# Patient Record
Sex: Male | Born: 1935
Health system: Southern US, Community
[De-identification: ages and names within clinical notes are randomized; demographics above are authoritative.]

## PROBLEM LIST (undated history)

## (undated) DIAGNOSIS — J449 Chronic obstructive pulmonary disease, unspecified: Secondary | ICD-10-CM

## (undated) DIAGNOSIS — K635 Polyp of colon: Secondary | ICD-10-CM

## (undated) DIAGNOSIS — I272 Pulmonary hypertension, unspecified: Secondary | ICD-10-CM

## (undated) DIAGNOSIS — I4891 Unspecified atrial fibrillation: Secondary | ICD-10-CM

## (undated) DIAGNOSIS — R7303 Prediabetes: Secondary | ICD-10-CM

## (undated) DIAGNOSIS — I10 Essential (primary) hypertension: Secondary | ICD-10-CM

## (undated) DIAGNOSIS — E785 Hyperlipidemia, unspecified: Secondary | ICD-10-CM

## (undated) DIAGNOSIS — N400 Enlarged prostate without lower urinary tract symptoms: Secondary | ICD-10-CM

## (undated) DIAGNOSIS — K5790 Diverticulosis of intestine, part unspecified, without perforation or abscess without bleeding: Secondary | ICD-10-CM

## (undated) HISTORY — DX: Prediabetes: R73.03

## (undated) HISTORY — DX: Polyp of colon: K63.5

## (undated) HISTORY — DX: Hyperlipidemia, unspecified: E78.5

## (undated) HISTORY — DX: Diverticulosis of intestine, part unspecified, without perforation or abscess without bleeding: K57.90

## (undated) HISTORY — DX: Essential (primary) hypertension: I10

## (undated) HISTORY — DX: Benign prostatic hyperplasia without lower urinary tract symptoms: N40.0

## (undated) HISTORY — PX: PROSTATE BIOPSY: SHX241

## (undated) HISTORY — DX: Pulmonary hypertension, unspecified: I27.20

---
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
## 2001-04-26 ENCOUNTER — Ambulatory Visit (HOSPITAL_COMMUNITY): Admission: RE | Admit: 2001-04-26 | Discharge: 2001-04-26 | Payer: Self-pay | Admitting: Family Medicine

## 2001-04-26 ENCOUNTER — Encounter: Payer: Self-pay | Admitting: Family Medicine

## 2001-05-11 ENCOUNTER — Ambulatory Visit (HOSPITAL_COMMUNITY): Admission: RE | Admit: 2001-05-11 | Discharge: 2001-05-11 | Payer: Self-pay | Admitting: General Surgery

## 2001-07-25 ENCOUNTER — Other Ambulatory Visit: Admission: RE | Admit: 2001-07-25 | Discharge: 2001-07-25 | Payer: Self-pay | Admitting: Urology

## 2002-04-10 ENCOUNTER — Ambulatory Visit (HOSPITAL_COMMUNITY): Admission: RE | Admit: 2002-04-10 | Discharge: 2002-04-10 | Payer: Self-pay | Admitting: Family Medicine

## 2002-04-10 ENCOUNTER — Encounter: Payer: Self-pay | Admitting: Family Medicine

## 2002-05-28 ENCOUNTER — Ambulatory Visit (HOSPITAL_COMMUNITY): Admission: RE | Admit: 2002-05-28 | Discharge: 2002-05-28 | Payer: Self-pay | Admitting: Family Medicine

## 2002-05-30 ENCOUNTER — Ambulatory Visit (HOSPITAL_COMMUNITY): Admission: RE | Admit: 2002-05-30 | Discharge: 2002-05-30 | Payer: Self-pay | Admitting: Family Medicine

## 2004-07-07 ENCOUNTER — Ambulatory Visit (HOSPITAL_COMMUNITY): Admission: RE | Admit: 2004-07-07 | Discharge: 2004-07-07 | Payer: Self-pay | Admitting: Family Medicine

## 2004-10-06 ENCOUNTER — Ambulatory Visit: Payer: Self-pay | Admitting: Family Medicine

## 2005-02-03 ENCOUNTER — Ambulatory Visit: Payer: Self-pay | Admitting: Family Medicine

## 2005-07-06 ENCOUNTER — Ambulatory Visit (HOSPITAL_COMMUNITY): Admission: RE | Admit: 2005-07-06 | Discharge: 2005-07-06 | Payer: Self-pay | Admitting: Family Medicine

## 2005-07-06 ENCOUNTER — Ambulatory Visit: Payer: Self-pay | Admitting: Family Medicine

## 2005-07-20 ENCOUNTER — Ambulatory Visit: Payer: Self-pay | Admitting: Family Medicine

## 2005-09-13 ENCOUNTER — Ambulatory Visit: Payer: Self-pay | Admitting: Family Medicine

## 2005-11-05 ENCOUNTER — Ambulatory Visit: Payer: Self-pay | Admitting: Family Medicine

## 2006-03-04 ENCOUNTER — Ambulatory Visit: Payer: Self-pay | Admitting: Family Medicine

## 2006-08-26 ENCOUNTER — Ambulatory Visit: Payer: Self-pay | Admitting: Family Medicine

## 2007-02-13 ENCOUNTER — Encounter: Payer: Self-pay | Admitting: Family Medicine

## 2007-02-13 LAB — CONVERTED CEMR LAB
ALT: 19 units/L (ref 0–53)
AST: 16 units/L (ref 0–37)
Albumin: 3.6 g/dL (ref 3.5–5.2)
Alkaline Phosphatase: 91 units/L (ref 39–117)
Bilirubin, Direct: 0.1 mg/dL (ref 0.0–0.3)
Indirect Bilirubin: 0.4 mg/dL (ref 0.0–0.9)
Total Bilirubin: 0.5 mg/dL (ref 0.3–1.2)
Total Protein: 6.8 g/dL (ref 6.0–8.3)

## 2007-02-14 ENCOUNTER — Encounter: Payer: Self-pay | Admitting: Family Medicine

## 2007-02-14 LAB — CONVERTED CEMR LAB
BUN: 23 mg/dL (ref 6–23)
CO2: 27 meq/L (ref 19–32)
Calcium: 9.1 mg/dL (ref 8.4–10.5)
Chloride: 103 meq/L (ref 96–112)
Cholesterol: 190 mg/dL (ref 0–200)
Creatinine, Ser: 1.05 mg/dL (ref 0.40–1.50)
Glucose, Bld: 98 mg/dL (ref 70–99)
HDL: 72 mg/dL (ref >39)
LDL Cholesterol: 108 mg/dL — ABNORMAL HIGH (ref 0–99)
Potassium: 4.6 meq/L (ref 3.5–5.3)
Sodium: 140 meq/L (ref 135–145)
Total CHOL/HDL Ratio: 2.6
Triglycerides: 50 mg/dL (ref <150)
VLDL: 10 mg/dL (ref 0–40)

## 2007-02-22 ENCOUNTER — Ambulatory Visit: Payer: Self-pay | Admitting: Family Medicine

## 2007-03-21 ENCOUNTER — Ambulatory Visit: Payer: Self-pay | Admitting: Internal Medicine

## 2007-03-21 ENCOUNTER — Ambulatory Visit (HOSPITAL_COMMUNITY): Admission: RE | Admit: 2007-03-21 | Discharge: 2007-03-21 | Payer: Self-pay | Admitting: Internal Medicine

## 2007-03-21 HISTORY — PX: COLONOSCOPY: SHX174

## 2007-03-21 LAB — HM COLONOSCOPY

## 2007-09-06 ENCOUNTER — Ambulatory Visit: Payer: Self-pay | Admitting: Family Medicine

## 2007-09-18 ENCOUNTER — Encounter: Payer: Self-pay | Admitting: Family Medicine

## 2007-09-18 LAB — CONVERTED CEMR LAB: PSA: 0.5 ng/mL

## 2007-09-26 ENCOUNTER — Ambulatory Visit: Payer: Self-pay | Admitting: Family Medicine

## 2008-04-01 ENCOUNTER — Encounter: Payer: Self-pay | Admitting: Family Medicine

## 2008-04-01 LAB — CONVERTED CEMR LAB
ALT: 19 units/L (ref 0–53)
AST: 21 units/L (ref 0–37)
Albumin: 3.7 g/dL (ref 3.5–5.2)
Alkaline Phosphatase: 85 units/L (ref 39–117)
BUN: 26 mg/dL — ABNORMAL HIGH (ref 6–23)
Basophils Absolute: 0 10*3/uL (ref 0.0–0.1)
Basophils Relative: 0 % (ref 0–1)
Bilirubin, Direct: 0.1 mg/dL (ref 0.0–0.3)
CO2: 26 meq/L (ref 19–32)
Calcium: 9.2 mg/dL (ref 8.4–10.5)
Chloride: 102 meq/L (ref 96–112)
Cholesterol: 187 mg/dL (ref 0–200)
Creatinine, Ser: 1.01 mg/dL (ref 0.40–1.50)
Eosinophils Absolute: 0.2 10*3/uL (ref 0.0–0.7)
Eosinophils Relative: 2 % (ref 0–5)
Glucose, Bld: 115 mg/dL — ABNORMAL HIGH (ref 70–99)
HCT: 52.1 % — ABNORMAL HIGH (ref 39.0–52.0)
HDL: 77 mg/dL (ref >39)
Hemoglobin: 16.7 g/dL (ref 13.0–17.0)
Indirect Bilirubin: 0.3 mg/dL (ref 0.0–0.9)
LDL Cholesterol: 102 mg/dL — ABNORMAL HIGH (ref 0–99)
Lymphocytes Relative: 16 % (ref 12–46)
Lymphs Abs: 1.3 10*3/uL (ref 0.7–4.0)
MCHC: 32.1 g/dL (ref 30.0–36.0)
MCV: 93.2 fL (ref 78.0–100.0)
Monocytes Absolute: 0.8 10*3/uL (ref 0.1–1.0)
Monocytes Relative: 10 % (ref 3–12)
Neutro Abs: 5.6 10*3/uL (ref 1.7–7.7)
Neutrophils Relative %: 72 % (ref 43–77)
Platelets: 310 10*3/uL (ref 150–400)
Potassium: 5.2 meq/L (ref 3.5–5.3)
RBC: 5.59 M/uL (ref 4.22–5.81)
RDW: 14.3 % (ref 11.5–15.5)
Sodium: 140 meq/L (ref 135–145)
Total Bilirubin: 0.4 mg/dL (ref 0.3–1.2)
Total CHOL/HDL Ratio: 2.4
Total Protein: 7 g/dL (ref 6.0–8.3)
Triglycerides: 39 mg/dL (ref <150)
VLDL: 8 mg/dL (ref 0–40)
WBC: 7.8 10*3/uL (ref 4.0–10.5)

## 2008-04-08 ENCOUNTER — Encounter: Payer: Self-pay | Admitting: Family Medicine

## 2008-04-08 DIAGNOSIS — D126 Benign neoplasm of colon, unspecified: Secondary | ICD-10-CM

## 2008-04-08 DIAGNOSIS — J45909 Unspecified asthma, uncomplicated: Secondary | ICD-10-CM | POA: Insufficient documentation

## 2008-04-08 DIAGNOSIS — I10 Essential (primary) hypertension: Secondary | ICD-10-CM

## 2008-04-08 DIAGNOSIS — E785 Hyperlipidemia, unspecified: Secondary | ICD-10-CM

## 2008-04-09 ENCOUNTER — Ambulatory Visit: Payer: Self-pay | Admitting: Family Medicine

## 2008-04-09 ENCOUNTER — Encounter: Payer: Self-pay | Admitting: Family Medicine

## 2008-06-24 ENCOUNTER — Encounter: Payer: Self-pay | Admitting: Family Medicine

## 2008-07-09 ENCOUNTER — Ambulatory Visit: Payer: Self-pay | Admitting: Family Medicine

## 2008-09-04 ENCOUNTER — Encounter: Payer: Self-pay | Admitting: Family Medicine

## 2008-09-17 ENCOUNTER — Ambulatory Visit: Payer: Self-pay | Admitting: Family Medicine

## 2008-10-04 ENCOUNTER — Encounter: Payer: Self-pay | Admitting: Family Medicine

## 2008-12-17 ENCOUNTER — Encounter: Payer: Self-pay | Admitting: Family Medicine

## 2008-12-18 LAB — CONVERTED CEMR LAB
BUN: 21 mg/dL (ref 6–23)
Bilirubin, Direct: 0.1 mg/dL (ref 0.0–0.3)
CO2: 29 meq/L (ref 19–32)
Cholesterol: 177 mg/dL (ref 0–200)
Glucose, Bld: 115 mg/dL — ABNORMAL HIGH (ref 70–99)
Indirect Bilirubin: 0.4 mg/dL (ref 0.0–0.9)
LDL Cholesterol: 86 mg/dL (ref 0–99)
Potassium: 5.2 meq/L (ref 3.5–5.3)
Total Bilirubin: 0.5 mg/dL (ref 0.3–1.2)
Total CHOL/HDL Ratio: 2.2
VLDL: 11 mg/dL (ref 0–40)

## 2008-12-20 ENCOUNTER — Encounter: Payer: Self-pay | Admitting: Family Medicine

## 2008-12-26 ENCOUNTER — Ambulatory Visit: Payer: Self-pay | Admitting: Family Medicine

## 2008-12-26 LAB — CONVERTED CEMR LAB: Hgb A1c MFr Bld: 6.2 %

## 2008-12-28 DIAGNOSIS — E739 Lactose intolerance, unspecified: Secondary | ICD-10-CM | POA: Insufficient documentation

## 2009-02-13 ENCOUNTER — Encounter: Payer: Self-pay | Admitting: Family Medicine

## 2009-03-13 ENCOUNTER — Ambulatory Visit: Payer: Self-pay | Admitting: Family Medicine

## 2009-06-23 ENCOUNTER — Encounter: Payer: Self-pay | Admitting: Family Medicine

## 2009-06-23 DIAGNOSIS — R5383 Other fatigue: Secondary | ICD-10-CM

## 2009-06-23 DIAGNOSIS — R5381 Other malaise: Secondary | ICD-10-CM

## 2009-06-25 LAB — CONVERTED CEMR LAB
Albumin: 3.6 g/dL (ref 3.5–5.2)
Bilirubin, Direct: 0.1 mg/dL (ref 0.0–0.3)
CO2: 26 meq/L (ref 19–32)
Chloride: 99 meq/L (ref 96–112)
Glucose, Bld: 121 mg/dL — ABNORMAL HIGH (ref 70–99)
HDL: 78 mg/dL (ref >39)
Potassium: 4.5 meq/L (ref 3.5–5.3)
Sodium: 137 meq/L (ref 135–145)
TSH: 1.781 microintl units/mL (ref 0.350–4.500)
Total Bilirubin: 0.6 mg/dL (ref 0.3–1.2)
Total CHOL/HDL Ratio: 2.3
VLDL: 9 mg/dL (ref 0–40)

## 2009-06-30 ENCOUNTER — Ambulatory Visit: Payer: Self-pay | Admitting: Family Medicine

## 2009-06-30 DIAGNOSIS — J40 Bronchitis, not specified as acute or chronic: Secondary | ICD-10-CM

## 2009-06-30 LAB — CONVERTED CEMR LAB: Hgb A1c MFr Bld: 5.9 %

## 2009-07-03 ENCOUNTER — Encounter: Payer: Self-pay | Admitting: Family Medicine

## 2009-07-31 ENCOUNTER — Encounter: Payer: Self-pay | Admitting: Family Medicine

## 2009-08-01 ENCOUNTER — Encounter: Payer: Self-pay | Admitting: Family Medicine

## 2009-09-23 ENCOUNTER — Encounter: Payer: Self-pay | Admitting: Family Medicine

## 2009-09-30 ENCOUNTER — Ambulatory Visit: Payer: Self-pay | Admitting: Family Medicine

## 2009-09-30 DIAGNOSIS — J45909 Unspecified asthma, uncomplicated: Secondary | ICD-10-CM | POA: Insufficient documentation

## 2009-10-01 ENCOUNTER — Encounter: Payer: Self-pay | Admitting: Family Medicine

## 2009-10-31 ENCOUNTER — Encounter: Payer: Self-pay | Admitting: Family Medicine

## 2009-11-22 DIAGNOSIS — D638 Anemia in other chronic diseases classified elsewhere: Secondary | ICD-10-CM | POA: Diagnosis present

## 2009-11-22 DIAGNOSIS — R7989 Other specified abnormal findings of blood chemistry: Secondary | ICD-10-CM | POA: Diagnosis present

## 2009-11-22 DIAGNOSIS — R7303 Prediabetes: Secondary | ICD-10-CM

## 2009-11-22 HISTORY — DX: Prediabetes: R73.03

## 2010-02-03 ENCOUNTER — Encounter: Payer: Self-pay | Admitting: Family Medicine

## 2010-02-25 ENCOUNTER — Encounter: Payer: Self-pay | Admitting: Family Medicine

## 2010-02-25 LAB — CONVERTED CEMR LAB
ALT: 13 units/L (ref 0–53)
AST: 18 units/L (ref 0–37)
Albumin: 3.8 g/dL (ref 3.5–5.2)
Alkaline Phosphatase: 78 units/L (ref 39–117)
Basophils Relative: 0 % (ref 0–1)
CO2: 29 meq/L (ref 19–32)
Calcium: 9.5 mg/dL (ref 8.4–10.5)
Creatinine, Ser: 0.94 mg/dL (ref 0.40–1.50)
Eosinophils Absolute: 0.3 10*3/uL (ref 0.0–0.7)
HDL: 66 mg/dL (ref >39)
Hgb A1c MFr Bld: 6 % (ref 4.6–6.1)
Lymphs Abs: 1.6 10*3/uL (ref 0.7–4.0)
MCHC: 31.2 g/dL (ref 30.0–36.0)
MCV: 95.1 fL (ref 78.0–100.0)
Monocytes Relative: 8 % (ref 3–12)
Neutro Abs: 6.3 10*3/uL (ref 1.7–7.7)
Neutrophils Relative %: 70 % (ref 43–77)
Platelets: 326 10*3/uL (ref 150–400)
RBC: 5.56 M/uL (ref 4.22–5.81)
Sodium: 141 meq/L (ref 135–145)
Total CHOL/HDL Ratio: 2.5
Total Protein: 6.8 g/dL (ref 6.0–8.3)
Triglycerides: 45 mg/dL (ref <150)
WBC: 9 10*3/uL (ref 4.0–10.5)

## 2010-03-02 ENCOUNTER — Ambulatory Visit: Payer: Self-pay | Admitting: Family Medicine

## 2010-08-04 ENCOUNTER — Encounter: Payer: Self-pay | Admitting: Family Medicine

## 2010-08-07 ENCOUNTER — Ambulatory Visit: Payer: Self-pay | Admitting: Family Medicine

## 2010-08-17 LAB — CONVERTED CEMR LAB
AST: 18 units/L (ref 0–37)
Alkaline Phosphatase: 75 units/L (ref 39–117)
BUN: 20 mg/dL (ref 6–23)
Calcium: 8.9 mg/dL (ref 8.4–10.5)
Chloride: 99 meq/L (ref 96–112)
Cholesterol: 154 mg/dL (ref 0–200)
Creatinine, Ser: 0.9 mg/dL (ref 0.40–1.50)
HDL: 65 mg/dL (ref >39)
Indirect Bilirubin: 0.3 mg/dL (ref 0.0–0.9)
LDL Cholesterol: 81 mg/dL (ref 0–99)
Total Protein: 6.8 g/dL (ref 6.0–8.3)
Triglycerides: 39 mg/dL (ref <150)

## 2010-08-19 ENCOUNTER — Encounter: Payer: Self-pay | Admitting: Family Medicine

## 2010-08-24 ENCOUNTER — Ambulatory Visit: Payer: Self-pay | Admitting: Family Medicine

## 2010-09-21 ENCOUNTER — Telehealth: Payer: Self-pay | Admitting: Family Medicine

## 2010-10-12 ENCOUNTER — Encounter: Payer: Self-pay | Admitting: Family Medicine

## 2010-10-26 ENCOUNTER — Encounter: Payer: Self-pay | Admitting: Family Medicine

## 2010-11-10 ENCOUNTER — Telehealth (INDEPENDENT_AMBULATORY_CARE_PROVIDER_SITE_OTHER): Payer: Self-pay | Admitting: *Deleted

## 2010-11-22 DIAGNOSIS — I272 Pulmonary hypertension, unspecified: Secondary | ICD-10-CM

## 2010-11-22 HISTORY — DX: Pulmonary hypertension, unspecified: I27.20

## 2010-11-30 ENCOUNTER — Telehealth: Payer: Self-pay | Admitting: Family Medicine

## 2010-12-22 NOTE — Letter (Signed)
[facility]   Imported By: Lind Guest 10/26/2010 11:37:19  _____________________________________________________________________  External Attachment:    Type:   Image     Comment:   External Document

## 2010-12-22 NOTE — Letter (Signed)
Summary: [facility]   [facility]   Imported By: Lind Guest 10/12/2010 09:06:20  _____________________________________________________________________  External Attachment:    Type:   Image     Comment:   External Document

## 2010-12-22 NOTE — Letter (Signed)
Summary: [facility]   Imported By: Lind Guest 09/07/2010 14:08:40  _____________________________________________________________________  External Attachment:    Type:   Image     Comment:   External Document

## 2010-12-22 NOTE — Assessment & Plan Note (Signed)
Summary: office visit   Vital Signs:  Patient profile:   76 year old male Height:      69.5 inches Weight:      214 pounds BMI:     31.26 O2 Sat:      93 % Pulse rate:   87 / minute Pulse rhythm:   regular Resp:     16 per minute BP sitting:   122 / 82  (left arm) Cuff size:   large  Vitals Entered By: Everitt Amber LPN (March 02, 2010 8:12 AM)  Nutrition Counseling: Patient's BMI is greater than 25 and therefore counseled on weight management options. CC: Follow up chronic problems   CC:  Follow up chronic problems.  History of Present Illness: Reports  that he has been  doing well. Denies recent fever or chills. Denies sinus pressure, nasal congestion , ear pain or sore throat. Denies chest congestion, or cough productive of sputum.He continues to have exertional dyspnea, uses his neb machine twice daily and has otc epinephrine spray. I advised against this and proivided a script for albuterol again Denies chest pain, palpitations, PND, orthopnea or leg swelling. Denies abdominal pain, nausea, vomitting, diarrhea or constipation. Denies change in bowel movements or bloody stool. Denies dysuria , frequency, incontinence or hesitancy. Denies  joint pain, swelling, or reduced mobility. Denies headaches, vertigo, seizures. Denies depression, anxiety or insomnia. Denies  rash, lesions, or itch. the pt is keeping up with his prostate [facility] regularly. mr Lauderback keeps physically active , and watches his carb intake to stave off diabetes, so far , he has been doing very well.     Current Medications (verified): 1)  Flovent Hfa 110 Mcg/act  Aero (Fluticasone Propionate  Hfa) .... Use As Directed 2)  Prednisone 5 Mg Tabs (Prednisone) .... Take 1 Tablet By Mouth Once A Day 3)  Amlodipine Besylate 10 Mg Tabs (Amlodipine Besylate) .... Take 1 Tablet By Mouth Once A Day 4)  Hydrochlorothiazide 25 Mg Tabs (Hydrochlorothiazide) .... Take 1 Tablet By Mouth Once A Day 5)  Proair Hfa 108  (90 Base) Mcg/act Aers (Albuterol Sulfate) .... Two Puffs Three Times A Day Prn 6)  Albuterol Sulfate (2.5 Mg/67ml) 0.083% Nebu (Albuterol Sulfate) .... One Vial in Nebulizer Two To Three Times A Day 7)  Ipratropium Bromide 0.02 % Soln (Ipratropium Bromide) .... One Vial in Nebulizer Two To Three Times A Day 8)  Simvastatin 20 Mg Tabs (Simvastatin) .... One Tab By Mouth Qhs  Allergies (verified): No Known Drug Allergies  Review of Systems      See HPI General:  Complains of fatigue. Eyes:  Denies blurring, discharge, eye pain, and red eye. Endo:  Denies cold intolerance, excessive hunger, excessive thirst, excessive urination, heat intolerance, polyuria, and weight change. Heme:  Complains of abnormal bruising; denies bleeding. Allergy:  Complains of seasonal allergies and sneezing; denies hives or rash and itching eyes.  Physical Exam  General:  Well-developed,overweight,in no acute distress; alert,appropriate and cooperative throughout examination HEENT: No facial asymmetry,  EOMI, No sinus tenderness, TM's Clear, oropharynx  pink and moist. poor dentition  Chest: decreased air entry,clear to ascultation, no crackles , few highpitched wheezes CVS: S1, S2, No murmurs, No S3.   Abd: Soft, Nontender.  MS: Adequate ROM spine, hips, shoulders and knees.  Ext: No edema.   CNS: CN 2-12 intact, power tone and sensation normal throughout.   Skin: Intact, superficial bruises, no rashes or ulceration.  Psych: Good eye contact, normal affect.  Memory intact,  not anxious or depressed appearing.    Impression & Recommendations:  Problem # 1:  IMPAIRED GLUCOSE TOLERANCE (ICD-271.3) Assessment Unchanged  Orders: T- Hemoglobin A1C ([id number]) April value 6.0  Problem # 2:  ASTHMA (ICD-493.90) Assessment: Unchanged  The following medications were removed from the medication list:    Proventil Hfa 108 (90 Base) Mcg/act Aers (Albuterol sulfate) ..... Inhale two puffs every six to eight  hours as needed.    Prednisone (pak) 5 Mg Tabs (Prednisone) ..... Use as directed His updated medication list for this problem includes:    Flovent Hfa 110 Mcg/act Aero (Fluticasone propionate  hfa) ..... Use as directed    Prednisone 5 Mg Tabs (Prednisone) .Marland Kitchen... Take 1 tablet by mouth once a day    Proair Hfa 108 (90 Base) Mcg/act Aers (Albuterol sulfate) .Marland Kitchen..Marland Kitchen Two puffs three times a day prn    Albuterol Sulfate (2.5 Mg/65ml) 0.083% Nebu (Albuterol sulfate) ..... One vial in nebulizer two to three times a day    Ipratropium Bromide 0.02 % Soln (Ipratropium bromide) ..... One vial in nebulizer two to three times a day    Symbicort 80-4.5 Mcg/act Aero (Budesonide-formoterol fumarate) .Marland Kitchen... 2 puffs twice daily  Problem # 3:  HYPERTENSION (ICD-401.9) Assessment: Unchanged  His updated medication list for this problem includes:    Amlodipine Besylate 10 Mg Tabs (Amlodipine besylate) .Marland Kitchen... Take 1 tablet by mouth once a day    Hydrochlorothiazide 25 Mg Tabs (Hydrochlorothiazide) .Marland Kitchen... Take 1 tablet by mouth once a day  Orders: T-Basic Metabolic Panel ([id number])  BP today: 122/82 Prior BP: 127/84 (09/30/2009)  Labs Reviewed: K+: 5.0 (02/24/2010) Creat: : 0.94 (02/24/2010)   Chol: 162 (02/24/2010)   HDL: 66 (02/24/2010)   LDL: 87 (02/24/2010)   TG: 45 (02/24/2010)  Problem # 4:  HYPERLIPIDEMIA (ICD-272.4) Assessment: Improved  His updated medication list for this problem includes:    Simvastatin 20 Mg Tabs (Simvastatin) ..... One tab by mouth qhs  Orders: T-Hepatic Function [id number]) T-Lipid Profile [id number])  Labs Reviewed: SGOT: 18 (02/24/2010)   SGPT: 13 (02/24/2010)   HDL:66 (02/24/2010), 78 (06/24/2009)  LDL:87 (02/24/2010), 96 (06/24/2009)  Chol:162 (02/24/2010), 183 (06/24/2009)  Trig:45 (02/24/2010), 45 (06/24/2009)  Complete Medication List: 1)  Flovent Hfa 110 Mcg/act Aero (Fluticasone propionate  hfa) .... Use as directed 2)  Prednisone 5 Mg Tabs (Prednisone)  .... Take 1 tablet by mouth once a day 3)  Amlodipine Besylate 10 Mg Tabs (Amlodipine besylate) .... Take 1 tablet by mouth once a day 4)  Hydrochlorothiazide 25 Mg Tabs (Hydrochlorothiazide) .... Take 1 tablet by mouth once a day 5)  Proair Hfa 108 (90 Base) Mcg/act Aers (Albuterol sulfate) .... Two puffs three times a day prn 6)  Albuterol Sulfate (2.5 Mg/79ml) 0.083% Nebu (Albuterol sulfate) .... One vial in nebulizer two to three times a day 7)  Ipratropium Bromide 0.02 % Soln (Ipratropium bromide) .... One vial in nebulizer two to three times a day 8)  Simvastatin 20 Mg Tabs (Simvastatin) .... One tab by mouth qhs 9)  Symbicort 80-4.5 Mcg/act Aero (Budesonide-formoterol fumarate) .... 2 puffs twice daily  Other Orders: T-TSH ([id number])  Patient Instructions: 1)  f/u in 5.5 months 2)  It is important that you exercise regularly at least 20 minutes 5 times a week. If you develop chest pain, have severe difficulty breathing, or feel very tired , stop exercising immediately and seek medical attention. 3)  You need to lose weight. Consider a lower calorie diet and regular exercise.  4)  BMP prior to visit, ICD-9: 5)  Hepatic Panel prior to visit, ICD-9: 6)  Lipid Panel prior to visit, ICD-9:  fasting in 5.5 months 7)  HbgA1C prior to visit, ICD-9: 8)  tSH 9)  pls stop the otc inhsaler and fill the rescue script given, pls call if it is tto expensive 10)  i will send for symbicort for you and let you know when available. 11)  Have a safe and happy 6 months Prescriptions: SYMBICORT 80-4.5 MCG/ACT AERO (BUDESONIDE-FORMOTEROL FUMARATE) 2 puffs twice daily  #1 x 3   Entered and Authorized by:   Syliva Overman MD   Signed by:   Syliva Overman MD on 03/02/2010   Method used:   Historical   RxID:   [id number]

## 2010-12-22 NOTE — Progress Notes (Signed)
Summary: dr. Rito Ehrlich  dr. Rito Ehrlich   Imported By: Lind Guest 02/13/2010 13:51:50  _____________________________________________________________________  External Attachment:    Type:   Image     Comment:   External Document

## 2010-12-22 NOTE — Letter (Signed)
Summary: [facility]   Imported By: Lind Guest 08/26/2010 12:50:01  _____________________________________________________________________  External Attachment:    Type:   Image     Comment:   External Document

## 2010-12-22 NOTE — Assessment & Plan Note (Signed)
Summary: flu shot  Nurse Visit   Allergies: No Known Drug Allergies  Immunizations Administered:  Influenza Vaccine # 1:    Vaccine Type: Fluvax Non-MCR    Site: right deltoid    Mfr: novartis    Dose: 0.5 ml    Route: IM    Given by: Adella Hare LPN    Exp. Date: 03/2011    Lot #: [id number]    VIS given: 06/16/10 version given August 07, 2010.  Orders Added: 1)  Influenza Vaccine NON MCR [00028]

## 2010-12-22 NOTE — Letter (Signed)
Summary: DR. Rito Ehrlich  DR. Rito Ehrlich   Imported By: Lind Guest 08/06/2010 14:15:45  _____________________________________________________________________  External Attachment:    Type:   Image     Comment:   External Document

## 2010-12-22 NOTE — Assessment & Plan Note (Signed)
Summary: office visit   Vital Signs:  Patient profile:   76 year old male Height:      69.5 inches Weight:      204 pounds BMI:     29.80 O2 Sat:      95 % Pulse rate:   87 / minute Pulse rhythm:   regular Resp:     16 per minute BP sitting:   120 / 74  (left arm) Cuff size:   regular  Vitals Entered By: Everitt Amber LPN (August 24, 2010 8:06 AM)  Nutrition Counseling: Patient's BMI is greater than 25 and therefore counseled on weight management options. CC: Follow up chronic problems   CC:  Follow up chronic problems.  History of Present Illness: Reports  thathe has been  doing well. Denies recent fever or chills. Denies sinus pressure, nasal congestion , ear pain or sore throat. Denies chest congestion, or cough productive of sputum.He has had no recent asthma flares. states the symbicort leaves a bad taste in his mouth and he cannot tolerate it. Denies chest pain, palpitations, PND, orthopnea or leg swelling. Denies abdominal pain, nausea, vomitting, diarrhea or constipation. Denies change in bowel movements or bloody stool. Denies dysuria , frequency, incontinence or hesitancy. Denies  joint pain, swelling, or reduced mobility. Denies headaches, vertigo, seizures. Denies depression, anxiety or insomnia. Denies  rash, lesions, or itch. he has been cutting back on hiss intake, has lost weight and does try to keep active.     Current Medications (verified): 1)  Prednisone 5 Mg Tabs (Prednisone) .... Take 1 Tablet By Mouth Once A Day 2)  Amlodipine Besylate 10 Mg Tabs (Amlodipine Besylate) .... Take 1 Tablet By Mouth Once A Day 3)  Hydrochlorothiazide 25 Mg Tabs (Hydrochlorothiazide) .... Take 1 Tablet By Mouth Once A Day 4)  Proair Hfa 108 (90 Base) Mcg/act Aers (Albuterol Sulfate) .... Two Puffs Three Times A Day Prn 5)  Albuterol Sulfate (2.5 Mg/51ml) 0.083% Nebu (Albuterol Sulfate) .... One Vial in Nebulizer Two To Three Times A Day 6)  Ipratropium Bromide 0.02 %  Soln (Ipratropium Bromide) .... One Vial in Nebulizer Two To Three Times A Day 7)  Simvastatin 20 Mg Tabs (Simvastatin) .... One Tab By Mouth Qhs 8)  Symbicort 80-4.5 Mcg/act Aero (Budesonide-Formoterol Fumarate) .... 2 Puffs Twice Daily  Allergies (verified): No Known Drug Allergies  Review of Systems      See HPI Eyes:  Denies blurring and discharge. Resp:  Complains of wheezing; no recent flare. GU:  recently had a prostate biopsy, l;ast week , and is awaiting results. Endo:  Denies cold intolerance, excessive thirst, excessive urination, and heat intolerance. Heme:  Denies abnormal bruising and bleeding. Allergy:  Complains of seasonal allergies.  Physical Exam  General:  Well-developed,overweight,in no acute distress; alert,appropriate and cooperative throughout examination HEENT: No facial asymmetry,  EOMI, No sinus tenderness, TM's Clear, oropharynx  pink and moist. poor dentition  Chest: decreased air entry,clear to ascultation, no crackles , few highpitched wheezes CVS: S1, S2, No murmurs, No S3.   Abd: Soft, Nontender.  MS: Adequate ROM spine, hips, shoulders and knees.  Ext: No edema.   CNS: CN 2-12 intact, power tone and sensation normal throughout.   Skin: Intact, superficial bruises, no rashes or ulceration.  Psych: Good eye contact, normal affect.  Memory intact, not anxious or depressed appearing.    Impression & Recommendations:  Problem # 1:  IMPAIRED GLUCOSE TOLERANCE (ICD-271.3) Assessment Deteriorated hBA1c 6.2 Patient advised to reduce  carbs and sweets, commit to regular physical activity, take meds as prescribed, test blood sugars as directed, and attempt to lose weight , to improve blood sugar control.  Problem # 2:  ASTHMA (ICD-493.90) Assessment: Unchanged  The following medications were removed from the medication list:    Flovent Hfa 110 Mcg/act Aero (Fluticasone propionate  hfa) ..... Use as directed    Symbicort 80-4.5 Mcg/act Aero  (Budesonide-formoterol fumarate) .Marland Kitchen... 2 puffs twice daily His updated medication list for this problem includes:    Prednisone 5 Mg Tabs (Prednisone) .Marland Kitchen... Take 1 tablet by mouth once a day    Proair Hfa 108 (90 Base) Mcg/act Aers (Albuterol sulfate) .Marland Kitchen..Marland Kitchen Two puffs three times a day prn    Albuterol Sulfate (2.5 Mg/48ml) 0.083% Nebu (Albuterol sulfate) ..... One vial in nebulizer two to three times a day    Ipratropium Bromide 0.02 % Soln (Ipratropium bromide) ..... One vial in nebulizer two to three times a day    Qvar 80 Mcg/act Aers (Beclomethasone dipropionate) ..... One puff twice daily  Problem # 3:  HYPERTENSION (ICD-401.9) Assessment: Unchanged  His updated medication list for this problem includes:    Amlodipine Besylate 10 Mg Tabs (Amlodipine besylate) .Marland Kitchen... Take 1 tablet by mouth once a day    Hydrochlorothiazide 25 Mg Tabs (Hydrochlorothiazide) .Marland Kitchen... Take 1 tablet by mouth once a day  Orders: T-Basic Metabolic Panel ([id number])  BP today: 120/74 Prior BP: 122/82 (03/02/2010)  Labs Reviewed: K+: 5.0 (02/24/2010) Creat: : 0.94 (02/24/2010)   Chol: 162 (02/24/2010)   HDL: 66 (02/24/2010)   LDL: 87 (02/24/2010)   TG: 45 (02/24/2010)  Problem # 4:  HYPERLIPIDEMIA (ICD-272.4) Assessment: Comment Only  The following medications were removed from the medication list:    Simvastatin 20 Mg Tabs (Simvastatin) ..... One tab by mouth qhs His updated medication list for this problem includes:    Lovastatin 40 Mg Tabs (Lovastatin) .Marland Kitchen... Take 1 tab by mouth at bedtime Low fat dietdiscussed and encouraged  Orders: T-Lipid Profile ([id number]) T-Hepatic Function [phone])  Labs Reviewed: SGOT: 18 (02/24/2010)   SGPT: 13 (02/24/2010)   HDL:66 (02/24/2010), 78 (06/24/2009)  LDL:87 (02/24/2010), 96 (06/24/2009)  Chol:162 (02/24/2010), 183 (06/24/2009)  Trig:45 (02/24/2010), 45 (06/24/2009)  Complete Medication List: 1)  Prednisone 5 Mg Tabs (Prednisone) .... Take 1 tablet by  mouth once a day 2)  Amlodipine Besylate 10 Mg Tabs (Amlodipine besylate) .... Take 1 tablet by mouth once a day 3)  Hydrochlorothiazide 25 Mg Tabs (Hydrochlorothiazide) .... Take 1 tablet by mouth once a day 4)  Proair Hfa 108 (90 Base) Mcg/act Aers (Albuterol sulfate) .... Two puffs three times a day prn 5)  Albuterol Sulfate (2.5 Mg/10ml) 0.083% Nebu (Albuterol sulfate) .... One vial in nebulizer two to three times a day 6)  Ipratropium Bromide 0.02 % Soln (Ipratropium bromide) .... One vial in nebulizer two to three times a day 7)  Lovastatin 40 Mg Tabs (Lovastatin) .... Take 1 tab by mouth at bedtime 8)  Qvar 80 Mcg/act Aers (Beclomethasone dipropionate) .... One puff twice daily  Other Orders: T-CBC w/Diff ([id number]) T- Hemoglobin A1C ([id number])  Patient Instructions: 1)  Follow up appointment in 5.52months 2)  It is important that you exercise regularly at least 20 minutes 5 times a week. If you develop chest pain, have severe difficulty breathing, or feel very tired , stop exercising immediately and seek medical attention. 3)  You need to lose weight. Consider a lower calorie diet and regular exercise.  4)  BMP prior to visit, ICD-9: 5)  Hepatic Panel prior to visit, ICD-9: 6)  Lipid Panel prior to visit, ICD-9: 7)  CBC w/ Diff prior to visit, ICD-9:   fasting in 5.46months 8)  HbgA1C prior to visit, ICD-9: 9)  New mwed for asthma: Qvar one puff twice daily 10)  STOP simvastatin, start lovastatin 40mg  one at night Prescriptions: QVAR 80 MCG/ACT AERS (BECLOMETHASONE DIPROPIONATE) one puff twice daily  #1 x 0   Entered and Authorized by:   Syliva Overman MD   Signed by:   Syliva Overman MD on 08/24/2010   Method used:   Samples Given   RxID:   [id number] LOVASTATIN 40 MG TABS (LOVASTATIN) Take 1 tab by mouth at bedtime  #90 x 3   Entered and Authorized by:   Syliva Overman MD   Signed by:   Syliva Overman MD on 08/24/2010   Method used:   Printed then faxed to  ...       [address] [phone]* (retail)       [address]       [place], [place]  [address]       Ph: [phone] or [phone]       Fax: [phone]   RxID:   [id number]

## 2010-12-22 NOTE — Progress Notes (Signed)
Summary: LEFT MESSAGE TO CALL  Phone Note Call from Patient   Summary of Call: LEFT MESSAGE NEEDS NURSE TO CALL HIM AT [phone] Initial call taken by: Lind Guest,  September 21, 2010 8:46 AM    Prescriptions: QVAR 80 MCG/ACT AERS (BECLOMETHASONE DIPROPIONATE) one puff twice daily  #1 x 0   Entered by:   Adella Hare LPN   Authorized by:   Syliva Overman MD   Signed by:   Adella Hare LPN on 65/78/4696   Method used:   Electronically to        [facility] [phone]* (retail)       [address]       [place], [place]  [address]       Ph: [phone] or [phone]       Fax: [phone]   RxID:   [id number]

## 2010-12-24 NOTE — Progress Notes (Signed)
Summary: SAMPLE  Phone Note Call from Patient   Summary of Call: WANTS TO KNOW DO YOU HAVE A SAMPLE OF QVAR  CALL BACK AT [phone] Initial call taken by: Lind Guest,  November 10, 2010 8:55 AM  Follow-up for Phone Call        we do have samples I went and asked Asher Muir.  Patient is aware and coming after 1 to pick up. Follow-up by: Curtis Sites,  November 10, 2010 12:17 PM

## 2010-12-24 NOTE — Progress Notes (Signed)
  Phone Note Call from Patient   Caller: Patient Summary of Call: Patient requests that we send in Proair in the place of QVAR. Patient states he cannot afford the QVAR. [employer] Initial call taken by: Adella Hare LPN,  November 30, 2010 8:43 AM  Follow-up for Phone Call        pls let pt know the med has been sent in Follow-up by: Syliva Overman MD,  November 30, 2010 2:09 PM  Additional Follow-up for Phone Call Additional follow up Details #1::        Patient is aware. Additional Follow-up by: Adella Hare LPN,  November 30, 2010 3:12 PM    New/Updated Medications: PROAIR HFA 108 (90 BASE) MCG/ACT AERS (ALBUTEROL SULFATE) two puffs every 6 to 8 hours as needed for wheezing Prescriptions: PROAIR HFA 108 (90 BASE) MCG/ACT AERS (ALBUTEROL SULFATE) two puffs every 6 to 8 hours as needed for wheezing  #1 x 3   Entered and Authorized by:   Syliva Overman MD   Signed by:   Syliva Overman MD on 11/30/2010   Method used:   Electronically to        [facility] [phone]* (retail)       [address]       [place], [place]  [address]       Ph: [phone] or [phone]       Fax: [phone]   RxID:   [id number]

## 2011-02-09 ENCOUNTER — Other Ambulatory Visit: Payer: Self-pay | Admitting: Family Medicine

## 2011-02-09 LAB — CBC WITH DIFFERENTIAL/PLATELET
Basophils Absolute: 0 10*3/uL (ref 0.0–0.1)
Eosinophils Absolute: 0.2 10*3/uL (ref 0.0–0.7)
Eosinophils Relative: 2 % (ref 0–5)
HCT: 50.3 % (ref 39.0–52.0)
Lymphocytes Relative: 16 % (ref 12–46)
Lymphs Abs: 1.1 10*3/uL (ref 0.7–4.0)
MCH: 30 pg (ref 26.0–34.0)
MCV: 94.2 fL (ref 78.0–100.0)
Monocytes Absolute: 0.7 10*3/uL (ref 0.1–1.0)
RDW: 13.6 % (ref 11.5–15.5)
WBC: 7.1 10*3/uL (ref 4.0–10.5)

## 2011-02-09 LAB — BASIC METABOLIC PANEL
BUN: 21 mg/dL (ref 6–23)
CO2: 28 mEq/L (ref 19–32)
Chloride: 101 mEq/L (ref 96–112)
Creat: 1 mg/dL (ref 0.40–1.50)

## 2011-02-09 LAB — HEPATIC FUNCTION PANEL
AST: 16 U/L (ref 0–37)
Alkaline Phosphatase: 66 U/L (ref 39–117)
Indirect Bilirubin: 0.5 mg/dL (ref 0.0–0.9)
Total Protein: 6.7 g/dL (ref 6.0–8.3)

## 2011-02-09 LAB — LIPID PANEL: LDL Cholesterol: 94 mg/dL (ref 0–99)

## 2011-02-12 ENCOUNTER — Encounter: Payer: Self-pay | Admitting: Family Medicine

## 2011-02-15 ENCOUNTER — Encounter: Payer: Self-pay | Admitting: Family Medicine

## 2011-02-16 ENCOUNTER — Encounter: Payer: Self-pay | Admitting: Family Medicine

## 2011-02-16 ENCOUNTER — Ambulatory Visit (INDEPENDENT_AMBULATORY_CARE_PROVIDER_SITE_OTHER): Payer: PRIVATE HEALTH INSURANCE | Admitting: Family Medicine

## 2011-02-16 VITALS — BP 110/70 | HR 80 | Resp 16 | Ht 70.0 in | Wt 214.1 lb

## 2011-02-16 DIAGNOSIS — R7309 Other abnormal glucose: Secondary | ICD-10-CM

## 2011-02-16 DIAGNOSIS — I10 Essential (primary) hypertension: Secondary | ICD-10-CM

## 2011-02-16 DIAGNOSIS — E785 Hyperlipidemia, unspecified: Secondary | ICD-10-CM

## 2011-02-16 DIAGNOSIS — R7303 Prediabetes: Secondary | ICD-10-CM

## 2011-02-16 DIAGNOSIS — J449 Chronic obstructive pulmonary disease, unspecified: Secondary | ICD-10-CM

## 2011-02-16 NOTE — Patient Instructions (Signed)
F/U in 6 months.  You will be referred for overnight pulse oximetry to see if you need oxygen while sleeping.  I will check into obtaining meds for your COPD at a more affordable price. Your labs are great and so is your blood pressure, keep it up.  It is important that you exercise regularly at least 30 minutes 5 times a week. If you develop chest pain, have severe difficulty breathing, or feel very tired, stop exercising immediately and seek medical attention    Fasting lipid, chem7, hepatic and HBA1C in 6 months.

## 2011-02-19 ENCOUNTER — Telehealth: Payer: Self-pay | Admitting: Family Medicine

## 2011-02-21 NOTE — Progress Notes (Signed)
  Subjective:    Patient ID: Kyle Mosley, male    DOB: [birth year], 77 y.o.   MRN: [id number]  HPI The PT is here for follow up and re-evaluation of chronic medical conditions, medication management and review of recent lab and radiology data.  Preventive health is updated, specifically  Cancer screening, Osteoporosis screening and Immunization.   Questions or concerns regarding consultations or procedures which the PT has had in the interim are  addressed. The PT denies any adverse reactions to current medications since the last visit.  There are no new concerns.  There are no specific complaints       Review of Systems  Constitutional: Negative for fever and chills.  HENT: Positive for hearing loss, rhinorrhea and postnasal drip. Negative for congestion, sneezing, neck pain, sinus pressure and tinnitus.   Eyes: Negative for pain and redness.  Respiratory: Positive for cough, shortness of breath and wheezing.   Cardiovascular: Negative for chest pain, palpitations and leg swelling.  Gastrointestinal: Negative for nausea, vomiting, diarrhea, constipation and abdominal distention.  Genitourinary: Negative for dysuria and frequency.  Musculoskeletal: Positive for back pain.  Skin: Negative for pallor and rash.  Neurological: Negative for tremors, seizures, weakness and headaches.  Hematological: Negative for adenopathy. Does not bruise/bleed easily.  Psychiatric/Behavioral: Negative for behavioral problems, confusion and agitation.       Objective:   Physical Exam  [nursing notereviewed. Patient alert and oriented and in no Cardiopulmonary distress.  HEENT: No facial asymmetry, EOMI, no sinus tenderness, TM's clear, Oropharynx pink and moist.  Neck supple no adenopathy.  Chest: Clear to auscultation bilaterally.decreased air entry bilaterally  CVS: S1, S2 no murmurs, no S3.  ABD: Soft non tender. Bowel sounds normal.  Ext: No edema  MS: decreased ROM spine,adequate in   shoulders, hips and knees.  Skin: Intact, no ulcerations or rash noted.  Psych: Good eye contact, normal affect. Memory intact not anxious or depressed appearing.  CNS: CN 2-12 intact, power, tone and sensation normal throughout.         Assessment & Plan:  Hypertension: controlled, no med changes. .Impaired glucose tolerance improved. 3.Asthma: unchanged will do pulse oximetry to asses for nocturnal oxygen, 4.Hyperlipidemia : controlled.Hyperlipidemia:Low fat diet discussed and encouraged.

## 2011-02-23 NOTE — Telephone Encounter (Signed)
Patient aware, order sent to [facility]

## 2011-02-24 ENCOUNTER — Encounter: Payer: Self-pay | Admitting: Family Medicine

## 2011-02-25 ENCOUNTER — Telehealth: Payer: Self-pay | Admitting: Family Medicine

## 2011-03-02 ENCOUNTER — Telehealth: Payer: Self-pay | Admitting: Family Medicine

## 2011-03-02 NOTE — Telephone Encounter (Signed)
noted 

## 2011-03-02 NOTE — Telephone Encounter (Signed)
Sent D/C order to [facility]. Patient said he would call back if he needed anything but right now he was fine

## 2011-03-02 NOTE — Telephone Encounter (Signed)
Kyle Mosley said the oxygen isn't doing him any good and he wants order faxed to [facility] to D/C it. He said if he needs anything he will call back but right now he's fine

## 2011-03-02 NOTE — Telephone Encounter (Signed)
Noted, he does qualify and needs it but is refusing at this time

## 2011-04-09 NOTE — Procedures (Signed)
[facility]  Patient:    Kyle Mosley, Kyle Mosley Visit Number: [id number] MRN: [id number]          Service Type: OUT Location: RESP Attending Physician:  Syliva Overman Dictated by:   Kari Baars, M.D. Proc. Date: 05/30/02 Admit Date:  05/30/2002 Discharge Date: 05/30/2002                      Pulmonary Function Test Inter.  RESULTS: 1. Spirometry shows moderate ventilatory defect with air flow obstruction. 2. Arterial blood gases shows a relative hypoxemia. 3. There is no significant bronchodilator response. Dictated by:   Kari Baars, M.D. Attending Physician:  Syliva Overman DD:  05/30/02 TD:  06/03/02 Job: [id number]

## 2011-04-09 NOTE — Op Note (Signed)
NAME:  Kyle Mosley, Kyle Mosley NO.:  [id number]   MEDICAL RECORD NO.:  [id number]          PATIENT TYPE:  AMB   LOCATION:  DAY                           FACILITY:  [facility]   PHYSICIAN:  R. Roetta Sessions, M.D. DATE OF BIRTH:  02/05/1935   DATE OF PROCEDURE:  03/21/2007  DATE OF DISCHARGE:                               OPERATIVE REPORT   Surveillance colonoscopy.   INDICATIONS FOR PROCEDURE:  The patient is a 76 year old African-  American male who has a history of colonic polyps taken out by Dr.  Katrinka Blazing.  Last exam was 2002.  He also had diverticulosis.  He is here for  surveillance.  He is followed primarily Dr. Syliva Overman.   This approach has been discussed with the patient at length.  Potential  risks, benefits and alternatives have been reviewed, questions answered.  Please see documentation in the medical record.   PROCEDURE NOTE:  O2 saturation, blood pressure, pulse and respirations  were monitored throughout the entire procedure.  Conscious sedation  Versed 4 mg IV, Demerol 75 mg IV in divided doses.   INSTRUMENT:  Pentax video chip system.   FINDINGS:  Digital rectal exam revealed no abnormalities.   ENDOSCOPIC FINDINGS:  The prep was adequate.   Colon:  Colonic mucosa was surveyed from the rectosigmoid junction  through the left, transverse, and right colon to area of the appendiceal  orifice, ileocecal valve and cecum.  These structures were well and seen  photographed for record.  From this level, scope was withdrawn.  All  previously mucosal surfaces were again seen.  The patient had pan  colonic diverticula.  The remainder of colonic mucosa appeared normal.  There was some liquid stool throughout the colon that had be washed and  suctioned.  We had trouble with the suction channel repeatedly clogging  during the procedure.  The scope was fully in the rectum where thorough  examination of rectal mucosa including retroflexed view of the anal  verge  demonstrated no abnormalities.  The patient tolerated the  procedure well and was reactive to endoscopy.   IMPRESSION:  1. Normal rectum.  2. Pan colonic diverticula.  The remainder of the colonic mucosa      appeared normal, somewhat elongated and tortuous colon.   RECOMMENDATIONS:  1. Diverticulosis literature provided to Mr. Talerico.  2. Repeat colonoscopy 5 years.      Jonathon Bellows, M.D.  Electronically Signed     RMR/MEDQ  D:  03/21/2007  T:  03/21/2007  Job:  [id number]

## 2011-04-15 ENCOUNTER — Telehealth: Payer: Self-pay | Admitting: Family Medicine

## 2011-04-15 NOTE — Telephone Encounter (Signed)
Patient aware none available

## 2011-04-26 ENCOUNTER — Other Ambulatory Visit: Payer: Self-pay | Admitting: Family Medicine

## 2011-05-28 ENCOUNTER — Other Ambulatory Visit: Payer: Self-pay | Admitting: Family Medicine

## 2011-07-02 ENCOUNTER — Other Ambulatory Visit: Payer: Self-pay | Admitting: Family Medicine

## 2011-08-10 ENCOUNTER — Other Ambulatory Visit: Payer: Self-pay | Admitting: Family Medicine

## 2011-08-11 ENCOUNTER — Telehealth: Payer: Self-pay | Admitting: *Deleted

## 2011-08-11 LAB — BASIC METABOLIC PANEL
BUN: 20 mg/dL (ref 6–23)
CO2: 33 mEq/L — ABNORMAL HIGH (ref 19–32)
Chloride: 99 mEq/L (ref 96–112)
Creat: 0.93 mg/dL (ref 0.50–1.35)
Glucose, Bld: 93 mg/dL (ref 70–99)

## 2011-08-11 LAB — HEPATIC FUNCTION PANEL
Alkaline Phosphatase: 67 U/L (ref 39–117)
Bilirubin, Direct: 0.1 mg/dL (ref 0.0–0.3)
Indirect Bilirubin: 0.3 mg/dL (ref 0.0–0.9)
Total Bilirubin: 0.4 mg/dL (ref 0.3–1.2)

## 2011-08-11 LAB — LIPID PANEL
LDL Cholesterol: 79 mg/dL (ref 0–99)
VLDL: 9 mg/dL (ref 0–40)

## 2011-08-11 NOTE — Telephone Encounter (Signed)
Message copied by Diamantina Monks on Wed Aug 11, 2011  9:39 AM ------      Message from: Syliva Overman MD E      Created: Wed Aug 11, 2011  6:18 AM       pls add lipid and hepatic

## 2011-08-11 NOTE — Telephone Encounter (Signed)
Tests added 

## 2011-08-16 ENCOUNTER — Encounter: Payer: Self-pay | Admitting: Family Medicine

## 2011-08-17 ENCOUNTER — Ambulatory Visit (INDEPENDENT_AMBULATORY_CARE_PROVIDER_SITE_OTHER): Payer: PRIVATE HEALTH INSURANCE | Admitting: Family Medicine

## 2011-08-17 ENCOUNTER — Encounter: Payer: Self-pay | Admitting: Family Medicine

## 2011-08-17 VITALS — BP 120/70 | HR 76 | Resp 17 | Ht 70.0 in | Wt 211.0 lb

## 2011-08-17 DIAGNOSIS — R7309 Other abnormal glucose: Secondary | ICD-10-CM

## 2011-08-17 DIAGNOSIS — E785 Hyperlipidemia, unspecified: Secondary | ICD-10-CM

## 2011-08-17 DIAGNOSIS — J45909 Unspecified asthma, uncomplicated: Secondary | ICD-10-CM

## 2011-08-17 DIAGNOSIS — R5381 Other malaise: Secondary | ICD-10-CM

## 2011-08-17 DIAGNOSIS — R7303 Prediabetes: Secondary | ICD-10-CM

## 2011-08-17 DIAGNOSIS — I10 Essential (primary) hypertension: Secondary | ICD-10-CM

## 2011-08-17 MED ORDER — FLUTICASONE-SALMETEROL 250-50 MCG/DOSE IN AEPB: 1.0000 | INHALATION_SPRAY | Freq: Two times a day (BID) | RESPIRATORY_TRACT | Status: DC

## 2011-08-17 MED ORDER — LOVASTATIN 40 MG PO TABS: 40.0000 mg | ORAL_TABLET | Freq: Every day | ORAL | Status: DC

## 2011-08-17 MED ORDER — METHYLPREDNISOLONE ACETATE 80 MG/ML IJ SUSP
80.0000 mg | Freq: Once | INTRAMUSCULAR | Status: AC
  Administered 2011-08-17: 80 mg via INTRAMUSCULAR

## 2011-08-17 NOTE — Assessment & Plan Note (Signed)
Uncontrolled , pt to start advair and also depomedrol administered and prednisone dose pack refilled

## 2011-08-17 NOTE — Assessment & Plan Note (Signed)
Controlled, no change in medication  

## 2011-08-17 NOTE — Progress Notes (Signed)
  Subjective:    Patient ID: MAZE CORNIEL, male    DOB: 1935-01-26, 76 y.o.   MRN: [id number]  HPI The PT is here for follow up and re-evaluation of chronic medical conditions, medication management and review of any available recent lab and radiology data.  Preventive health is updated, specifically  Cancer screening and Immunization.   Questions or concerns regarding consultations or procedures which the PT has had in the interim are  addressed. The PT denies any adverse reactions to current medications since the last visit.  There are no new concerns.  C/o increased shortness of breath and wheezing in the past 2 to 3 weeks. Is willing to use a maintenance inhaler if it is affordable , using neb machine twice daily as well as an MDI     Review of Systems See HPI Denies recent fever or chills. Denies sinus pressure, nasal congestion, ear pain or sore throat. Denies chest congestion, productive cough . Denies chest pains, palpitations and leg swelling Denies abdominal pain, nausea, vomiting,diarrhea or constipation.   Denies dysuria, frequency, hesitancy or incontinence. Denies joint pain, swelling and limitation in mobility. Denies headaches, seizures, numbness, or tingling. Denies depression, anxiety or insomnia. Denies skin break down or rash.        Objective:   Physical Exam  Patient alert and oriented and in no cardiopulmonary distress.  HEENT: No facial asymmetry, EOMI, no sinus tenderness,  oropharynx pink and moist.  Neck supple no adenopathy.  Chest: Clear to auscultation bilaterally.Markedly reduced air entry throughout, no crackles  CVS: S1, S2 no murmurs, no S3.  ABD: Soft non tender. Bowel sounds normal.  Ext: No edema  MS: Adequate ROM spine, shoulders, hips and knees.  Skin: Intact, no ulcerations or rash noted.  Psych: Good eye contact, normal affect. Memory intact not anxious or depressed appearing.  CNS: CN 2-12 intact, power, tone and sensation  normal throughout.       Assessment & Plan:

## 2011-08-17 NOTE — Patient Instructions (Signed)
F/U in March 2013  You will get an injection of depoedrol today for your breathing  LABWORK  NEEDS TO BE DONE BETWEEN 3 TO 7 DAYS BEFORE YOUR NEXT SCEDULED  VISIT.  THIS WILL IMPROVE THE QUALITY OF YOUR CARE. Fasting cbc, chem 7 lipid, hepatic , HBA1C and TSH in March.  New inhaler for DAILY use to assist with breathing  Pls ensure you get your flu vaccine

## 2011-10-13 ENCOUNTER — Other Ambulatory Visit: Payer: Self-pay | Admitting: Family Medicine

## 2011-10-18 ENCOUNTER — Telehealth: Payer: Self-pay | Admitting: Family Medicine

## 2011-10-18 NOTE — Telephone Encounter (Signed)
Sent in this am

## 2012-01-17 ENCOUNTER — Other Ambulatory Visit: Payer: Self-pay | Admitting: Family Medicine

## 2012-02-09 LAB — LIPID PANEL
LDL Cholesterol: 80 mg/dL (ref 0–99)
Triglycerides: 50 mg/dL (ref <150)
VLDL: 10 mg/dL (ref 0–40)

## 2012-02-09 LAB — HEPATIC FUNCTION PANEL
Albumin: 3.5 g/dL (ref 3.5–5.2)
Alkaline Phosphatase: 73 U/L (ref 39–117)
Indirect Bilirubin: 0.3 mg/dL (ref 0.0–0.9)
Total Bilirubin: 0.4 mg/dL (ref 0.3–1.2)
Total Protein: 6.2 g/dL (ref 6.0–8.3)

## 2012-02-09 LAB — CBC WITH DIFFERENTIAL/PLATELET
Basophils Absolute: 0 10*3/uL (ref 0.0–0.1)
Eosinophils Relative: 6 % — ABNORMAL HIGH (ref 0–5)
HCT: 50.8 % (ref 39.0–52.0)
Hemoglobin: 15.9 g/dL (ref 13.0–17.0)
Lymphocytes Relative: 17 % (ref 12–46)
Lymphs Abs: 1 10*3/uL (ref 0.7–4.0)
MCV: 94.6 fL (ref 78.0–100.0)
Monocytes Absolute: 0.6 10*3/uL (ref 0.1–1.0)
Monocytes Relative: 10 % (ref 3–12)
Neutro Abs: 4.2 10*3/uL (ref 1.7–7.7)
RBC: 5.37 MIL/uL (ref 4.22–5.81)
WBC: 6.3 10*3/uL (ref 4.0–10.5)

## 2012-02-09 LAB — BASIC METABOLIC PANEL
BUN: 17 mg/dL (ref 6–23)
CO2: 31 mEq/L (ref 19–32)
Chloride: 102 mEq/L (ref 96–112)
Creat: 0.92 mg/dL (ref 0.50–1.35)
Potassium: 4.8 mEq/L (ref 3.5–5.3)

## 2012-02-09 LAB — HEMOGLOBIN A1C: Mean Plasma Glucose: 120 mg/dL — ABNORMAL HIGH (ref <117)

## 2012-02-15 ENCOUNTER — Encounter: Payer: Self-pay | Admitting: Family Medicine

## 2012-02-15 ENCOUNTER — Ambulatory Visit (INDEPENDENT_AMBULATORY_CARE_PROVIDER_SITE_OTHER): Payer: PRIVATE HEALTH INSURANCE | Admitting: Family Medicine

## 2012-02-15 VITALS — BP 110/74 | HR 74 | Resp 16 | Ht 70.0 in | Wt 213.0 lb

## 2012-02-15 DIAGNOSIS — R7302 Impaired glucose tolerance (oral): Secondary | ICD-10-CM | POA: Insufficient documentation

## 2012-02-15 DIAGNOSIS — J45909 Unspecified asthma, uncomplicated: Secondary | ICD-10-CM

## 2012-02-15 DIAGNOSIS — R5381 Other malaise: Secondary | ICD-10-CM

## 2012-02-15 DIAGNOSIS — E739 Lactose intolerance, unspecified: Secondary | ICD-10-CM

## 2012-02-15 DIAGNOSIS — E785 Hyperlipidemia, unspecified: Secondary | ICD-10-CM

## 2012-02-15 DIAGNOSIS — R7309 Other abnormal glucose: Secondary | ICD-10-CM

## 2012-02-15 DIAGNOSIS — Z2911 Encounter for prophylactic immunotherapy for respiratory syncytial virus (RSV): Secondary | ICD-10-CM

## 2012-02-15 DIAGNOSIS — I10 Essential (primary) hypertension: Secondary | ICD-10-CM

## 2012-02-15 DIAGNOSIS — Z23 Encounter for immunization: Secondary | ICD-10-CM

## 2012-02-15 MED ORDER — HYDROCHLOROTHIAZIDE 25 MG PO TABS: 25.0000 mg | ORAL_TABLET | Freq: Every day | ORAL | Status: DC

## 2012-02-15 MED ORDER — AMLODIPINE BESYLATE 10 MG PO TABS: 10.0000 mg | ORAL_TABLET | Freq: Every day | ORAL | Status: DC

## 2012-02-15 NOTE — Assessment & Plan Note (Signed)
Hyperlipidemia:Low fat diet discussed and encouraged.  Controlled, no change in medication   

## 2012-02-15 NOTE — Progress Notes (Signed)
Addended by: Abner Greenspan on: 02/15/2012 08:46 AM   Modules accepted: Orders, Medications

## 2012-02-15 NOTE — Patient Instructions (Signed)
F/U early October.  Call if you need me before please  TSH, fasting lipid and cmp , HBA1C in October , before visit  Shingles vaccine today  Labs are excellent , keep it up!  No med changes

## 2012-02-15 NOTE — Progress Notes (Signed)
Addended by: Abner Greenspan on: 02/15/2012 08:43 AM   Modules accepted: Orders

## 2012-02-15 NOTE — Assessment & Plan Note (Signed)
Improved HBA1C, pt applauded on this and encouraged to continue to follow low carb diet

## 2012-02-15 NOTE — Progress Notes (Signed)
  Subjective:    Patient ID: Kyle Mosley, male    DOB: [birth year], 77 y.o.   MRN: [id number]  HPI The PT is here for follow up and re-evaluation of chronic medical conditions, medication management and review of any available recent lab and radiology data.  Preventive health is updated, specifically  Cancer screening and Immunization.   Questions or concerns regarding consultations or procedures which the PT has had in the interim are  addressed. The PT denies any adverse reactions to current medications since the last visit.  There are no new concerns.  There are no specific complaints       Review of Systems See HPI Denies recent fever or chills. Denies sinus pressure, nasal congestion, ear pain or sore throat. Denies chest congestion, productive cough or wheezing.Short of breath with minimal activity due to severe COPD/asthma Denies chest pains, palpitations and leg swelling Denies abdominal pain, nausea, vomiting,diarrhea or constipation.   Denies dysuria, frequency, hesitancy or incontinence. Denies joint pain, swelling and limitation in mobility. Denies headaches, seizures, numbness, or tingling. Denies depression, anxiety or insomnia. Denies skin break down or rash.        Objective:   Physical Exam  Patient alert and oriented and in no cardiopulmonary distress.  HEENT: No facial asymmetry, EOMI, no sinus tenderness,  oropharynx pink and moist.  Neck supple no adenopathy.  Chest: Clear to auscultation bilaterally.Decreased air entry bilaterally  CVS: S1, S2 no murmurs, no S3.  ABD: Soft non tender. Bowel sounds normal.  Ext: No edema  MS: Adequate ROM spine, shoulders, hips and knees.  Skin: Intact, no ulcerations or rash noted.  Psych: Good eye contact, normal affect. Memory intact not anxious or depressed appearing.  CNS: CN 2-12 intact, power, tone and sensation normal throughout.       Assessment & Plan:

## 2012-02-15 NOTE — Assessment & Plan Note (Signed)
Controlled, no change in medication  

## 2012-02-15 NOTE — Assessment & Plan Note (Signed)
Stable on medication , no recent flare

## 2012-02-24 ENCOUNTER — Telehealth: Payer: Self-pay | Admitting: Family Medicine

## 2012-02-25 MED ORDER — LOVASTATIN 40 MG PO TABS: 40.0000 mg | ORAL_TABLET | Freq: Every day | ORAL | Status: DC

## 2012-02-25 MED ORDER — HYDROCHLOROTHIAZIDE 25 MG PO TABS: 25.0000 mg | ORAL_TABLET | Freq: Every day | ORAL | Status: DC

## 2012-02-25 NOTE — Telephone Encounter (Signed)
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 States he only uses [facility] for breathing meds. Resent meds to [facility]

## 2012-02-28 ENCOUNTER — Other Ambulatory Visit: Payer: Self-pay | Admitting: Family Medicine

## 2012-03-17 ENCOUNTER — Encounter: Payer: Self-pay | Admitting: Internal Medicine

## 2012-04-04 ENCOUNTER — Encounter: Payer: Self-pay | Admitting: Internal Medicine

## 2012-04-04 ENCOUNTER — Ambulatory Visit (INDEPENDENT_AMBULATORY_CARE_PROVIDER_SITE_OTHER): Payer: PRIVATE HEALTH INSURANCE | Admitting: Internal Medicine

## 2012-04-04 VITALS — BP 125/75 | HR 90 | Temp 98.1°F | Ht 72.0 in | Wt 212.6 lb

## 2012-04-04 DIAGNOSIS — D126 Benign neoplasm of colon, unspecified: Secondary | ICD-10-CM

## 2012-04-04 NOTE — Patient Instructions (Signed)
We will set up a colonoscopy in the near future to follow up on your history of polyps.

## 2012-04-04 NOTE — Progress Notes (Signed)
Primary Care Physician:  Margaret Simpson, MD, MD Primary Gastroenterologist:  Dr. Annita Ratliff  Pre-Procedure History & Physical: HPI:  Kyle Mosley is a 77 y.o. male here for for consideration of surveillance colonoscopy. History of colonic adenoma removed by Dr. Smith back in 2002. Last colonoscopy in 2008 revealed pancolonic diverticulosis but no polyps. He is here for five-year followup. He denies any lower GI tract symptoms. No significant intercurrent illnesses or hospitalizations. He has any melena or rectal bleeding.  Past Medical History  Diagnosis Date  . Colon polyps   . Asthma   . Hyperlipidemia   . Hypertension   . Enlarged prostate     with elevated PSA  . Prediabetes 2011  . Diverticulosis     Past Surgical History  Procedure Date  . Prostate biopsy   . Colonoscopy 03/21/2007    Dr. Maybel Dambrosio-pancolonic diverticula, normal rectum    Prior to Admission medications   Medication Sig Start Date End Date Taking? Authorizing Provider  albuterol (PROVENTIL) (2.5 MG/3ML) 0.083% nebulizer solution USE ONE VIAL IN NEBULIZER TWO TO THREE TIMES A DAY. 10/13/11  Yes Margaret E Simpson, MD  amLODipine (NORVASC) 10 MG tablet TAKE ONE TABLET BY MOUTH EVERY DAY FOR BLOOD PRESSURE 02/28/12  Yes Margaret E Simpson, MD  ATROVENT 0.02 % nebulizer solution USE ONE VIAL IN NEBULIZER TWO TO THREE TIMES A DAY. 01/17/12  Yes Margaret E Simpson, MD  Fluticasone-Salmeterol (ADVAIR DISKUS) 250-50 MCG/DOSE AEPB Inhale 1 puff into the lungs 2 (two) times daily. 08/17/11 08/16/12 Yes Margaret E Simpson, MD  hydrochlorothiazide (HYDRODIURIL) 25 MG tablet Take 1 tablet (25 mg total) by mouth daily. 02/25/12  Yes Margaret E Simpson, MD  lovastatin (MEVACOR) 40 MG tablet Take 1 tablet (40 mg total) by mouth at bedtime. 02/25/12  Yes Margaret E Simpson, MD  predniSONE (DELTASONE) 5 MG tablet Take 5 mg by mouth daily.     Yes Historical Provider, MD  PROAIR HFA 108 (90 BASE) MCG/ACT inhaler INHALE 2 PUFFS EVERY 6 TO 8 HOURS  AS NEEDED FOR WHEEZING 02/28/12  Yes Margaret E Simpson, MD    Allergies as of 04/04/2012  . (No Known Allergies)    Family History  Problem Relation Age of Onset  . Heart attack Father     History   Social History  . Marital Status: Married    Spouse Name: N/A    Number of Children: 3  . Years of Education: N/A   Occupational History  . retired     Social History Main Topics  . Smoking status: Former Smoker  . Smokeless tobacco: Not on file  . Alcohol Use: No  . Drug Use: No  . Sexually Active: Not on file   Other Topics Concern  . Not on file   Social History Narrative  . No narrative on file    Review of Systems: See HPI, otherwise negative ROS  Physical Exam: BP 125/75  Pulse 90  Temp(Src) 98.1 F (36.7 C) (Temporal)  Ht 6' (1.829 m)  Wt 212 lb 9.6 oz (96.435 kg)  BMI 28.83 kg/m2 General:   Alert,  Well-developed, well-nourished, pleasant and cooperative in NAD Skin:  Intact without significant lesions or rashes. Eyes:  Sclera clear, no icterus.   Conjunctiva pink. Ears:  Normal auditory acuity. Nose:  No deformity, discharge,  or lesions. Mouth:  No deformity or lesions. Neck:  Supple; no masses or thyromegaly. No significant cervical adenopathy. Lungs:  Clear throughout to auscultation.   No wheezes, crackles,   or rhonchi. No acute distress. Heart:  Regular rate and rhythm; no murmurs, clicks, rubs,  or gallops. Abdomen: Non-distended, normal bowel sounds.  Soft and nontender without appreciable mass or hepatosplenomegaly.  Pulses:  Normal pulses noted. Extremities:  Without clubbing or edema.  

## 2012-04-04 NOTE — Assessment & Plan Note (Signed)
Pleasant 77 year old gentleman history of colonic adenoma in the distant past. He is due for surveillance colonoscopy this time. He is devoid of any lower GI tract symptoms.  Recommendations:  Surveillance colonoscopy in the very near future.The risks, benefits, limitations, alternatives and imponderables have been reviewed with the patient. Questions have been answered. All parties are agreeable.

## 2012-04-27 ENCOUNTER — Encounter (HOSPITAL_COMMUNITY): Payer: Self-pay | Admitting: Pharmacy Technician

## 2012-05-03 MED ORDER — SODIUM CHLORIDE 0.45 % IV SOLN
Freq: Once | INTRAVENOUS | Status: AC
  Administered 2012-05-04: 1000 mL via INTRAVENOUS

## 2012-05-04 ENCOUNTER — Encounter (HOSPITAL_COMMUNITY)
Admission: RE | Disposition: A | Discharge status: Home / Self Care | Payer: Self-pay | Source: Ambulatory Visit | Attending: Internal Medicine

## 2012-05-04 ENCOUNTER — Ambulatory Visit (HOSPITAL_COMMUNITY)
Admission: RE | Admit: 2012-05-04 | Discharge: 2012-05-04 | Disposition: A | Discharge status: Home / Self Care | Payer: Medicare Other | Source: Ambulatory Visit | Attending: Internal Medicine | Admitting: Internal Medicine

## 2012-05-04 ENCOUNTER — Encounter (HOSPITAL_COMMUNITY): Payer: Self-pay | Admitting: *Deleted

## 2012-05-04 DIAGNOSIS — Z860100 Personal history of colon polyps, unspecified: Secondary | ICD-10-CM | POA: Insufficient documentation

## 2012-05-04 DIAGNOSIS — D126 Benign neoplasm of colon, unspecified: Secondary | ICD-10-CM

## 2012-05-04 DIAGNOSIS — Z79899 Other long term (current) drug therapy: Secondary | ICD-10-CM | POA: Insufficient documentation

## 2012-05-04 DIAGNOSIS — K5730 Diverticulosis of large intestine without perforation or abscess without bleeding: Secondary | ICD-10-CM

## 2012-05-04 DIAGNOSIS — Z09 Encounter for follow-up examination after completed treatment for conditions other than malignant neoplasm: Secondary | ICD-10-CM | POA: Insufficient documentation

## 2012-05-04 DIAGNOSIS — E785 Hyperlipidemia, unspecified: Secondary | ICD-10-CM | POA: Insufficient documentation

## 2012-05-04 DIAGNOSIS — I10 Essential (primary) hypertension: Secondary | ICD-10-CM | POA: Insufficient documentation

## 2012-05-04 DIAGNOSIS — Z1211 Encounter for screening for malignant neoplasm of colon: Secondary | ICD-10-CM

## 2012-05-04 DIAGNOSIS — K6389 Other specified diseases of intestine: Secondary | ICD-10-CM

## 2012-05-04 HISTORY — PX: COLONOSCOPY: SHX5424

## 2012-05-04 HISTORY — DX: Chronic obstructive pulmonary disease, unspecified: J44.9

## 2012-05-04 SURGERY — COLONOSCOPY
Anesthesia: Moderate Sedation

## 2012-05-04 MED ORDER — MIDAZOLAM HCL 5 MG/5ML IJ SOLN
INTRAMUSCULAR | Status: DC | PRN
  Administered 2012-05-04: 1 mg via INTRAVENOUS
  Administered 2012-05-04: 2 mg via INTRAVENOUS
  Administered 2012-05-04: 1 mg via INTRAVENOUS

## 2012-05-04 MED ORDER — MIDAZOLAM HCL 5 MG/5ML IJ SOLN
INTRAMUSCULAR | Status: AC
  Filled 2012-05-04: qty 10

## 2012-05-04 MED ORDER — SIMETHICONE 40 MG/0.6ML PO SUSP
ORAL | Status: DC | PRN
  Administered 2012-05-04: 07:00:00

## 2012-05-04 MED ORDER — MEPERIDINE HCL 100 MG/ML IJ SOLN
INTRAMUSCULAR | Status: AC
  Filled 2012-05-04: qty 2

## 2012-05-04 MED ORDER — MEPERIDINE HCL 100 MG/ML IJ SOLN
INTRAMUSCULAR | Status: DC | PRN
  Administered 2012-05-04 (×3): 25 mg via INTRAVENOUS

## 2012-05-04 NOTE — Discharge Instructions (Addendum)
Colonoscopy Discharge Instructions  Read the instructions outlined below and refer to this sheet in the next few weeks. These discharge instructions provide you with general information on caring for yourself after you leave the hospital. Your doctor may also give you specific instructions. While your treatment has been planned according to the most current medical practices available, unavoidable complications occasionally occur. If you have any problems or questions after discharge, call Dr. Jena Gauss at [phone]. ACTIVITY  You may resume your regular activity, but move at a slower pace for the next 24 hours.   Take frequent rest periods for the next 24 hours.   Walking will help get rid of the air and reduce the bloated feeling in your belly (abdomen).   No driving for 24 hours (because of the medicine (anesthesia) used during the test).    Do not sign any important legal documents or operate any machinery for 24 hours (because of the anesthesia used during the test).  NUTRITION  Drink plenty of fluids.   You may resume your normal diet as instructed by your doctor.   Begin with a light meal and progress to your normal diet. Heavy or fried foods are harder to digest and may make you feel sick to your stomach (nauseated).   Avoid alcoholic beverages for 24 hours or as instructed.  MEDICATIONS  You may resume your normal medications unless your doctor tells you otherwise.  WHAT YOU CAN EXPECT TODAY  Some feelings of bloating in the abdomen.   Passage of more gas than usual.   Spotting of blood in your stool or on the toilet paper.  IF YOU HAD POLYPS REMOVED DURING THE COLONOSCOPY:  No aspirin products for 7 days or as instructed.   No alcohol for 7 days or as instructed.   Eat a soft diet for the next 24 hours.  FINDING OUT THE RESULTS OF YOUR TEST Not all test results are available during your visit. If your test results are not back during the visit, make an appointment  with your caregiver to find out the results. Do not assume everything is normal if you have not heard from your caregiver or the medical facility. It is important for you to follow up on all of your test results.  SEEK IMMEDIATE MEDICAL ATTENTION IF:  You have more than a spotting of blood in your stool.   Your belly is swollen (abdominal distention).   You are nauseated or vomiting.   You have a temperature over 101.   You have abdominal pain or discomfort that is severe or gets worse throughout the day.   Diverticulosis information provided.  Repeat surveillance colonoscopy in 5 years Diverticulosis Diverticulosis is a common condition that develops when small pouches (diverticula) form in the wall of the colon. The risk of diverticulosis increases with age. It happens more often in people who eat a low-fiber diet. Most individuals with diverticulosis have no symptoms. Those individuals with symptoms usually experience abdominal pain, constipation, or loose stools (diarrhea). HOME CARE INSTRUCTIONS   Increase the amount of fiber in your diet as directed by your caregiver or dietician. This may reduce symptoms of diverticulosis.   Your caregiver may recommend taking a dietary fiber supplement.   Drink at least 6 to 8 glasses of water each day to prevent constipation.   Try not to strain when you have a bowel movement.   Your caregiver may recommend avoiding nuts and seeds to prevent complications, although this is still an uncertain  benefit.   Only take over-the-counter or prescription medicines for pain, discomfort, or fever as directed by your caregiver.  FOODS WITH HIGH FIBER CONTENT INCLUDE:  Fruits. Apple, peach, pear, tangerine, raisins, prunes.   Vegetables. Brussels sprouts, asparagus, broccoli, cabbage, carrot, cauliflower, romaine lettuce, spinach, summer squash, tomato, winter squash, zucchini.   Starchy Vegetables. Baked beans, kidney beans, lima beans, split peas,  lentils, potatoes (with skin).   Grains. Whole wheat bread, brown rice, bran flake cereal, plain oatmeal, white rice, shredded wheat, bran muffins.  SEEK IMMEDIATE MEDICAL CARE IF:   You develop increasing pain or severe bloating.   You have an oral temperature above 102 F (38.9 C), not controlled by medicine.   You develop vomiting or bowel movements that are bloody or black.  Document Released: 11-12-2004 Document Revised: 10/28/2011 Document Reviewed: 04/08/2010 [facility] Patient Information 2012 [place], [place].

## 2012-05-04 NOTE — Interval H&P Note (Signed)
History and Physical Interval Note:  05/04/2012 7:30 AM  Kyle Mosley  has presented today for surgery, with the diagnosis of hx of polyps  The various methods of treatment have been discussed with the patient and family. After consideration of risks, benefits and other options for treatment, the patient has consented to  Procedure(s) (LRB): COLONOSCOPY (N/A) as a surgical intervention .  The patients' history has been reviewed, patient examined, no change in status, stable for surgery.  I have reviewed the patients' chart and labs.  Questions were answered to the patient's satisfaction.     [name]

## 2012-05-04 NOTE — H&P (View-Only) (Signed)
Primary Care Physician:  Syliva Overman, MD, MD Primary Gastroenterologist:  Dr. Jena Gauss  Pre-Procedure History & Physical: HPI:  Kyle Mosley is a 77 y.o. male here for for consideration of surveillance colonoscopy. History of colonic adenoma removed by Dr. Katrinka Blazing back in 2002. Last colonoscopy in 2008 revealed pancolonic diverticulosis but no polyps. He is here for five-year followup. He denies any lower GI tract symptoms. No significant intercurrent illnesses or hospitalizations. He has any melena or rectal bleeding.  Past Medical History  Diagnosis Date  . Colon polyps   . Asthma   . Hyperlipidemia   . Hypertension   . Enlarged prostate     with elevated PSA  . Prediabetes 2011  . Diverticulosis     Past Surgical History  Procedure Date  . Prostate biopsy   . Colonoscopy 03/21/2007    Dr. Campbell Stall diverticula, normal rectum    Prior to Admission medications   Medication Sig Start Date End Date Taking? Authorizing Provider  albuterol (PROVENTIL) (2.5 MG/3ML) 0.083% nebulizer solution USE ONE VIAL IN NEBULIZER TWO TO THREE TIMES A DAY. 10/13/11  Yes Kerri Perches, MD  amLODipine (NORVASC) 10 MG tablet TAKE ONE TABLET BY MOUTH EVERY DAY FOR BLOOD PRESSURE 02/28/12  Yes Kerri Perches, MD  ATROVENT 0.02 % nebulizer solution USE ONE VIAL IN NEBULIZER TWO TO THREE TIMES A DAY. 01/17/12  Yes Kerri Perches, MD  Fluticasone-Salmeterol (ADVAIR DISKUS) 250-50 MCG/DOSE AEPB Inhale 1 puff into the lungs 2 (two) times daily. 08/17/11 08/16/12 Yes Kerri Perches, MD  hydrochlorothiazide (HYDRODIURIL) 25 MG tablet Take 1 tablet (25 mg total) by mouth daily. 02/25/12  Yes Kerri Perches, MD  lovastatin (MEVACOR) 40 MG tablet Take 1 tablet (40 mg total) by mouth at bedtime. 02/25/12  Yes Kerri Perches, MD  predniSONE (DELTASONE) 5 MG tablet Take 5 mg by mouth daily.     Yes Historical Provider, MD  PROAIR HFA 108 (90 BASE) MCG/ACT inhaler INHALE 2 PUFFS EVERY 6 TO 8 HOURS  AS NEEDED FOR WHEEZING 02/28/12  Yes Kerri Perches, MD    Allergies as of 04/04/2012  . (No Known Allergies)    Family History  Problem Relation Age of Onset  . Heart attack Father     History   Social History  . Marital Status: Married    Spouse Name: N/A    Number of Children: 3  . Years of Education: N/A   Occupational History  . retired     Social History Main Topics  . Smoking status: Former Games developer  . Smokeless tobacco: Not on file  . Alcohol Use: No  . Drug Use: No  . Sexually Active: Not on file   Other Topics Concern  . Not on file   Social History Narrative  . No narrative on file    Review of Systems: See HPI, otherwise negative ROS  Physical Exam: BP 125/75  Pulse 90  Temp(Src) 98.1 F (36.7 C) (Temporal)  Ht 6' (1.829 m)  Wt 212 lb 9.6 oz (96.435 kg)  BMI 28.83 kg/m2 General:   Alert,  Well-developed, well-nourished, pleasant and cooperative in NAD Skin:  Intact without significant lesions or rashes. Eyes:  Sclera clear, no icterus.   Conjunctiva pink. Ears:  Normal auditory acuity. Nose:  No deformity, discharge,  or lesions. Mouth:  No deformity or lesions. Neck:  Supple; no masses or thyromegaly. No significant cervical adenopathy. Lungs:  Clear throughout to auscultation.   No wheezes, crackles,  or rhonchi. No acute distress. Heart:  Regular rate and rhythm; no murmurs, clicks, rubs,  or gallops. Abdomen: Non-distended, normal bowel sounds.  Soft and nontender without appreciable mass or hepatosplenomegaly.  Pulses:  Normal pulses noted. Extremities:  Without clubbing or edema.

## 2012-05-04 NOTE — Op Note (Signed)
[facility] [address] [place], [place]  [address]  COLONOSCOPY PROCEDURE REPORT  PATIENT:  Kyle Mosley, Kyle Mosley  MR#:  [id number] BIRTHDATE:  [birth year], 77 yrs. old  GENDER:  male ENDOSCOPIST:  R. Roetta Sessions, MD FACP [facility] REF. BY:  Syliva Overman, M.D. PROCEDURE DATE:  05/04/2012 PROCEDURE:  Surveillance colonoscopy  INDICATIONS:  Distant history of colonic polyps  INFORMED CONSENT:  The risks, benefits, alternatives and imponderables including but not limited to bleeding, perforation as well as the possibility of a missed lesion have been reviewed. The potential for biopsy, lesion removal, etc. have also been discussed.  Questions have been answered.  All parties agreeable. Please see the history and physical in the medical record for more information.  MEDICATIONS:  Versed 4 mg IV and Demerol 75 mg IV in divided doses.  DESCRIPTION OF PROCEDURE:  After a digital rectal exam was performed, the EC-[id number] ([id number]) colonoscope was advanced from the anus through the rectum and colon to the area of the cecum, ileocecal valve and appendiceal orifice.  The cecum was deeply intubated.  These structures were well-seen and photographed for the record.  From the level of the cecum and ileocecal valve, the scope was slowly and cautiously withdrawn.  The mucosal surfaces were carefully surveyed utilizing scope tip deflection to facilitate fold flattening as needed.  The scope was pulled down into the rectum where a thorough examination including retroflexion was performed. <<PROCEDUREIMAGES>>  FINDINGS: Suboptimal preparation. Normal rectal mucosa. Pan colonic diverticulosis. Pigmentation of the left and transverse segments; remainder                     of colonic mucosa appeared normal.  THERAPEUTIC / DIAGNOSTIC MANEUVERS PERFORMED: None  COMPLICATIONS:  None  CECAL WITHDRAWAL TIME: 8 minutes  IMPRESSION: Pancolonic diverticulosis. Melanosis  coli.  RECOMMENDATIONS:  Recommend one more surveillance colonoscopy in 5 years.  ______________________________ R. Roetta Sessions, MD Caleen Essex  CC:  Syliva Overman, M.D.  n. eSIGNED:   R. Roetta Sessions at 05/04/2012 08:11 AM  Sherlean Foot, [phone]

## 2012-05-08 ENCOUNTER — Encounter (HOSPITAL_COMMUNITY): Payer: Self-pay | Admitting: Internal Medicine

## 2012-08-22 ENCOUNTER — Other Ambulatory Visit: Payer: Self-pay | Admitting: Family Medicine

## 2012-08-23 LAB — COMPLETE METABOLIC PANEL WITH GFR
ALT: 14 U/L (ref 0–53)
AST: 20 U/L (ref 0–37)
Albumin: 3.5 g/dL (ref 3.5–5.2)
Calcium: 9 mg/dL (ref 8.4–10.5)
Chloride: 101 mEq/L (ref 96–112)
Potassium: 5.2 mEq/L (ref 3.5–5.3)
Sodium: 139 mEq/L (ref 135–145)
Total Protein: 6.7 g/dL (ref 6.0–8.3)

## 2012-08-23 LAB — HEMOGLOBIN A1C
Hgb A1c MFr Bld: 5.8 % — ABNORMAL HIGH (ref <5.7)
Mean Plasma Glucose: 120 mg/dL — ABNORMAL HIGH (ref <117)

## 2012-08-23 LAB — LIPID PANEL
LDL Cholesterol: 94 mg/dL (ref 0–99)
Triglycerides: 48 mg/dL (ref <150)
VLDL: 10 mg/dL (ref 0–40)

## 2012-08-23 LAB — TSH: TSH: 2.451 u[IU]/mL (ref 0.350–4.500)

## 2012-08-28 ENCOUNTER — Ambulatory Visit (INDEPENDENT_AMBULATORY_CARE_PROVIDER_SITE_OTHER): Payer: Medicare Other | Admitting: Family Medicine

## 2012-08-28 ENCOUNTER — Encounter: Payer: Self-pay | Admitting: Family Medicine

## 2012-08-28 VITALS — BP 120/74 | HR 74 | Resp 15 | Ht 72.0 in | Wt 206.0 lb

## 2012-08-28 DIAGNOSIS — I10 Essential (primary) hypertension: Secondary | ICD-10-CM

## 2012-08-28 DIAGNOSIS — R7309 Other abnormal glucose: Secondary | ICD-10-CM

## 2012-08-28 DIAGNOSIS — R5383 Other fatigue: Secondary | ICD-10-CM

## 2012-08-28 DIAGNOSIS — R5381 Other malaise: Secondary | ICD-10-CM

## 2012-08-28 DIAGNOSIS — D126 Benign neoplasm of colon, unspecified: Secondary | ICD-10-CM

## 2012-08-28 DIAGNOSIS — R7302 Impaired glucose tolerance (oral): Secondary | ICD-10-CM

## 2012-08-28 DIAGNOSIS — R062 Wheezing: Secondary | ICD-10-CM

## 2012-08-28 DIAGNOSIS — E785 Hyperlipidemia, unspecified: Secondary | ICD-10-CM

## 2012-08-28 MED ORDER — ALBUTEROL SULFATE HFA 108 (90 BASE) MCG/ACT IN AERS: INHALATION_SPRAY | RESPIRATORY_TRACT | Status: DC

## 2012-08-28 MED ORDER — ALBUTEROL SULFATE (2.5 MG/3ML) 0.083% IN NEBU: INHALATION_SOLUTION | RESPIRATORY_TRACT | Status: DC

## 2012-08-28 MED ORDER — HYDROCHLOROTHIAZIDE 25 MG PO TABS: 25.0000 mg | ORAL_TABLET | Freq: Every day | ORAL | Status: DC

## 2012-08-28 MED ORDER — LOVASTATIN 40 MG PO TABS: 40.0000 mg | ORAL_TABLET | Freq: Every day | ORAL | Status: DC

## 2012-08-28 MED ORDER — AMLODIPINE BESYLATE 10 MG PO TABS: ORAL_TABLET | ORAL | Status: DC

## 2012-08-28 MED ORDER — IPRATROPIUM BROMIDE 0.02 % IN SOLN: RESPIRATORY_TRACT | Status: DC

## 2012-08-28 NOTE — Assessment & Plan Note (Signed)
Well controlled on current regime, no recent flares

## 2012-08-28 NOTE — Assessment & Plan Note (Signed)
Hyperlipidemia:Low fat diet discussed and encouraged.  Controlled, no change in medication   

## 2012-08-28 NOTE — Assessment & Plan Note (Signed)
Controlled, no change in medication DASH diet and commitment to daily physical activity for a minimum of 30 minutes discussed and encouraged, as a part of hypertension management. The importance of attaining a healthy weight is also discussed.  

## 2012-08-28 NOTE — Assessment & Plan Note (Signed)
rept colonoscopy in 2018

## 2012-08-28 NOTE — Progress Notes (Signed)
  Subjective:    Patient ID: Kyle Mosley, male    DOB: 10-11-1935, 77 y.o.   MRN: [id number]  HPI The PT is here for follow up and re-evaluation of chronic medical conditions, medication management and review of any available recent lab and radiology data.  Preventive health is updated, specifically  Cancer screening and Immunization. Had colonoscopy earlier this year , diverticulosis, but due to polyp history, need rept in 5 years  Questions or concerns regarding consultations or procedures which the PT has had in the interim are  Addressed.Has seen new urologist re prostate cancer has 6 month f/u The PT denies any adverse reactions to current medications since the last visit.  There are no new concerns.  There are no specific complaints       Review of Systems See HPI Denies recent fever or chills. Denies sinus pressure, nasal congestion, ear pain or sore throat. Denies chest congestion, productive cough or wheezing.Uses neb treatment once daily Denies chest pains, palpitations and leg swelling Denies abdominal pain, nausea, vomiting,diarrhea or constipation.   Denies dysuria, frequency, hesitancy or incontinence. Denies joint pain, swelling and limitation in mobility. Denies headaches, seizures, numbness, or tingling. Denies depression, anxiety or insomnia. Denies skin break down or rash.        Objective:   Physical Exam  Patient alert and oriented and in no cardiopulmonary distress.  HEENT: No facial asymmetry, EOMI, no sinus tenderness,  oropharynx pink and moist.  Neck supple no adenopathy.  Chest: Clear to auscultation bilaterally.Decreased air entry throughout  CVS: S1, S2 no murmurs, no S3.  ABD: Soft non tender. Bowel sounds normal.  Ext: No edema  MS: Adequate ROM spine, shoulders, hips and knees.  Skin: Intact, no ulcerations or rash noted.  Psych: Good eye contact, normal affect. Memory intact not anxious or depressed appearing.  CNS: CN 2-12  intact, power, tone and sensation normal throughout.       Assessment & Plan:

## 2012-08-28 NOTE — Assessment & Plan Note (Signed)
Stable, pt commited to healthy lifestyle

## 2012-08-28 NOTE — Patient Instructions (Addendum)
F/u in early April  Labs and blood pressure are excellent. No changes in medication  Please start one multivitamin example centrum, once daily  Fasting lipid, cmp , cbc, earlyu April, before vist.  You are doing extremely well, hope that you keep this up.  Please call if you need me before  It is important that you exercise regularly at least 30 minutes 5 times a week. If you develop chest pain, have severe difficulty breathing, or feel very tired, stop exercising immediately and seek medical attention  A healthy diet is rich in fruit, vegetables and whole grains. Poultry fish, nuts and beans are a healthy choice for protein rather then red meat. A low sodium diet and drinking 64 ounces of water daily is generally recommended. Oils and sweet should be limited. Carbohydrates especially for those who are diabetic or overweight, should be limited to 30-45 gram per meal. It is important to eat on a regular schedule, at least 3 times daily. Snacks should be primarily fruits, vegetables or nuts.

## 2012-09-21 ENCOUNTER — Telehealth: Payer: Self-pay | Admitting: Family Medicine

## 2012-11-23 ENCOUNTER — Other Ambulatory Visit: Payer: Self-pay | Admitting: Family Medicine

## 2012-11-29 ENCOUNTER — Other Ambulatory Visit: Payer: Self-pay | Admitting: Family Medicine

## 2012-12-08 ENCOUNTER — Telehealth: Payer: Self-pay | Admitting: Family Medicine

## 2012-12-08 NOTE — Telephone Encounter (Signed)
Please let pt know his insurance is requiring he try symbicort, and if that does not work they will help with the advair. Let him know symbicort is an excellent drug. I am entering the drug, pls fax after you speak with him

## 2012-12-08 NOTE — Telephone Encounter (Signed)
Patient aware.

## 2013-01-01 ENCOUNTER — Encounter: Payer: Self-pay | Admitting: Family Medicine

## 2013-01-01 ENCOUNTER — Ambulatory Visit (INDEPENDENT_AMBULATORY_CARE_PROVIDER_SITE_OTHER): Payer: Medicare Other | Admitting: Family Medicine

## 2013-01-01 VITALS — BP 112/70 | HR 82 | Resp 16 | Ht 72.0 in | Wt 210.4 lb

## 2013-01-01 DIAGNOSIS — M150 Primary generalized (osteo)arthritis: Secondary | ICD-10-CM | POA: Insufficient documentation

## 2013-01-01 DIAGNOSIS — J45909 Unspecified asthma, uncomplicated: Secondary | ICD-10-CM

## 2013-01-01 DIAGNOSIS — M109 Gout, unspecified: Secondary | ICD-10-CM

## 2013-01-01 DIAGNOSIS — I10 Essential (primary) hypertension: Secondary | ICD-10-CM

## 2013-01-01 DIAGNOSIS — M159 Polyosteoarthritis, unspecified: Secondary | ICD-10-CM

## 2013-01-01 MED ORDER — KETOROLAC TROMETHAMINE 60 MG/2ML IM SOLN
60.0000 mg | Freq: Once | INTRAMUSCULAR | Status: AC
  Administered 2013-01-01: 60 mg via INTRAMUSCULAR

## 2013-01-01 MED ORDER — NAPROXEN 375 MG PO TABS: 375.0000 mg | ORAL_TABLET | Freq: Two times a day (BID) | ORAL | Status: AC

## 2013-01-01 MED ORDER — METHYLPREDNISOLONE ACETATE 80 MG/ML IJ SUSP
80.0000 mg | Freq: Once | INTRAMUSCULAR | Status: AC
  Administered 2013-01-01: 80 mg via INTRAMUSCULAR

## 2013-01-01 MED ORDER — PREDNISONE (PAK) 5 MG PO TABS: 5.0000 mg | ORAL_TABLET | ORAL | Status: DC

## 2013-01-01 MED ORDER — RANITIDINE HCL 150 MG PO TABS: 150.0000 mg | ORAL_TABLET | Freq: Two times a day (BID) | ORAL | Status: DC

## 2013-01-01 NOTE — Patient Instructions (Addendum)
F/u as before.Call if no better in 3 days pls  You are treated for acute arthritis, toradol 60 mg and depo medrol 80mg  iM in office today. Medication is also sent in, 3 tablets, one , zantac is to protect your stomach.  Extra lab added to current order for uric acid level

## 2013-01-01 NOTE — Progress Notes (Signed)
  Subjective:    Patient ID: Kyle Mosley, male    DOB: 1935-11-17, 78 y.o.   MRN: [id number]  HPI  1 week h/o generalized joint pain and swelling, has had some relief for pain with tylenol, but no relief of the swelling. Affected joints are fingers, top of hand, wrist, toes, feet and ankle. States approx 20 yrs ago he had similar episode, affecting upper ext only.  Pain rated at a 9  Review of Systems See HPI Denies recent fever or chills. Denies sinus pressure, nasal congestion, ear pain or sore throat. Denies chest congestion, productive cough or wheezing.Chroni exertional  dyspnea due to lung disease Denies chest pains, palpitations and leg swelling Denies abdominal pain, nausea, vomiting,diarrhea or constipation.   Denies dysuria, frequency, hesitancy or incontinence. Denies depression, anxiety or insomnia. Denies skin break down or rash.        Objective:   Physical Exam  Patient alert and oriented and in no cardiopulmonary distress.Pt in pain  HEENT: No facial asymmetry, EOMI, no sinus tenderness,  oropharynx pink and moist.  Neck supple no adenopathy.  Chest: Clear to auscultation bilaterally.Decreased air entry throughout  CVS: S1, S2 no murmurs, no S3.  ABD: Soft non tender. Bowel sounds normal.  Ext: No edema  MS: swelling and tenderness of hands and digits also the ankles and feet  CNS: CN 2-12 intact, power, tnormal throughout.       Assessment & Plan:

## 2013-01-01 NOTE — Assessment & Plan Note (Signed)
Acute generalized pain and swelling of fingers, wrists, ankles, feet and toes Toradol 60mg  and depo medrol 80mg  IM in office, followed by anti inflammatories orally. Needs uric acid with next lab

## 2013-01-04 NOTE — Assessment & Plan Note (Signed)
Controlled, no change in medication  

## 2013-01-04 NOTE — Assessment & Plan Note (Signed)
Controlled, no change in medication No acute flare currently

## 2013-02-04 ENCOUNTER — Other Ambulatory Visit: Payer: Self-pay | Admitting: Family Medicine

## 2013-02-20 ENCOUNTER — Other Ambulatory Visit: Payer: Self-pay | Admitting: Family Medicine

## 2013-02-21 LAB — COMPREHENSIVE METABOLIC PANEL
Alkaline Phosphatase: 71 U/L (ref 39–117)
BUN: 18 mg/dL (ref 6–23)
Glucose, Bld: 101 mg/dL — ABNORMAL HIGH (ref 70–99)
Sodium: 139 mEq/L (ref 135–145)
Total Bilirubin: 0.5 mg/dL (ref 0.3–1.2)
Total Protein: 6.1 g/dL (ref 6.0–8.3)

## 2013-02-21 LAB — CBC WITH DIFFERENTIAL/PLATELET
Basophils Relative: 0 % (ref 0–1)
Eosinophils Absolute: 0.4 10*3/uL (ref 0.0–0.7)
Hemoglobin: 14.7 g/dL (ref 13.0–17.0)
MCH: 29.1 pg (ref 26.0–34.0)
MCHC: 32.7 g/dL (ref 30.0–36.0)
Monocytes Relative: 9 % (ref 3–12)
Neutrophils Relative %: 70 % (ref 43–77)

## 2013-02-21 LAB — LIPID PANEL
HDL: 64 mg/dL (ref >39)
LDL Cholesterol: 78 mg/dL (ref 0–99)
Total CHOL/HDL Ratio: 2.3 Ratio
Triglycerides: 39 mg/dL (ref <150)
VLDL: 8 mg/dL (ref 0–40)

## 2013-02-26 ENCOUNTER — Ambulatory Visit (INDEPENDENT_AMBULATORY_CARE_PROVIDER_SITE_OTHER): Payer: Medicare Other | Admitting: Family Medicine

## 2013-02-26 ENCOUNTER — Encounter: Payer: Self-pay | Admitting: Family Medicine

## 2013-02-26 VITALS — BP 110/72 | HR 89 | Resp 16 | Ht 72.0 in | Wt 208.0 lb

## 2013-02-26 DIAGNOSIS — R5381 Other malaise: Secondary | ICD-10-CM

## 2013-02-26 DIAGNOSIS — M150 Primary generalized (osteo)arthritis: Secondary | ICD-10-CM

## 2013-02-26 DIAGNOSIS — I10 Essential (primary) hypertension: Secondary | ICD-10-CM

## 2013-02-26 DIAGNOSIS — R7302 Impaired glucose tolerance (oral): Secondary | ICD-10-CM

## 2013-02-26 DIAGNOSIS — Z0000 Encounter for general adult medical examination without abnormal findings: Secondary | ICD-10-CM

## 2013-02-26 DIAGNOSIS — E785 Hyperlipidemia, unspecified: Secondary | ICD-10-CM

## 2013-02-26 DIAGNOSIS — R7309 Other abnormal glucose: Secondary | ICD-10-CM

## 2013-02-26 DIAGNOSIS — M159 Polyosteoarthritis, unspecified: Secondary | ICD-10-CM

## 2013-02-26 MED ORDER — RANITIDINE HCL 150 MG PO TABS: 150.0000 mg | ORAL_TABLET | Freq: Two times a day (BID) | ORAL | Status: DC

## 2013-02-26 MED ORDER — LOVASTATIN 40 MG PO TABS: 40.0000 mg | ORAL_TABLET | Freq: Every day | ORAL | Status: DC

## 2013-02-26 NOTE — Progress Notes (Signed)
Subjective:    Patient ID: Kyle Mosley, male    DOB: 12-28-1934, 78 y.o.   MRN: [id number]  HPI Preventive Screening-Counseling & Management   Patient present here today for a Medicare annual wellness visit.   Current Problems (verified)   Medications Prior to Visit Allergies (verified)   PAST HISTORY  Family History  Social History Married x 55 years, father of 3 sons. Former smoker, quit in approx 2004, never alcohol or street drugd. Retired from [employer], [employer] at age 43   Risk Factors  Current exercise habits: none at present will commit to three 10 minute sessions daily   Dietary issues discussed:low fat low carb diet   Cardiac risk factors:   Depression Screen  (Note: if answer to either of the following is "Yes", a more complete depression screening is indicated)   Over the past two weeks, have you felt down, depressed or hopeless? No  Over the past two weeks, have you felt little interest or pleasure in doing things? No  Have you lost interest or pleasure in daily life? No  Do you often feel hopeless? No  Do you cry easily over simple problems? No   Activities of Daily Living  In your present state of health, do you have any difficulty performing the following activities?  Driving?: No Managing money?: No Feeding yourself?:No Getting from bed to chair?:no  Climbing a flight of stairs?:yes, shortness of breath and right ankle instability Preparing food and eating?:No Bathing or showering?:No Getting dressed?:No Getting to the toilet?:No Using the toilet?:No Moving around from place to place?: No  Fall Risk Assessment In the past year have you fallen or had a near fall?:No Are you currently taking any medications that make you dizziness?:No   Hearing Difficulties: No Do you often ask people to speak up or repeat themselves?:sometimes Do you experience ringing or noises in your ears?:No Do you have difficulty understanding soft or whispered  voices?:yes  Cognitive Testing  Alert? Yes Normal Appearance?Yes  Oriented to person? Yes Place? Yes  Time? Yes  Displays appropriate judgment?Yes  Can read the correct time from a watch face? yes Are you having problems remembering things?No  Advanced Directives have been discussed with the patient?Yes , full code   List the Names of Other Physician/Practitioners you currently use:    Indicate any recent Medical Services you may have received from other than [facility] providers in the past year (date may be approximate).   Assessment:    Annual Wellness Exam   Plan:    During the course of the visit the patient was educated and counseled about appropriate screening and preventive services including:  A healthy diet is rich in fruit, vegetables and whole grains. Poultry fish, nuts and beans are a healthy choice for protein rather then red meat. A low sodium diet and drinking 64 ounces of water daily is generally recommended. Oils and sweet should be limited. Carbohydrates especially for those who are diabetic or overweight, should be limited to 30-45 gram per meal. It is important to eat on a regular schedule, at least 3 times daily. Snacks should be primarily fruits, vegetables or nuts. It is important that you exercise regularly at least 30 minutes 5 times a week. If you develop chest pain, have severe difficulty breathing, or feel very tired, stop exercising immediately and seek medical attention  Immunization reviewed and updated. Cancer screening reviewed and updated    Patient Instructions (the written plan) was given to the patient.  Medicare Attestation  I have personally reviewed:  The patient's medical and social history  Their use of alcohol, tobacco or illicit drugs  Their current medications and supplements  The patient's functional ability including ADLs,fall risks, home safety risks, cognitive, and hearing and visual impairment  Diet and physical activities  Evidence  for depression or mood disorders  The patient's weight, height, BMI, and visual acuity have been recorded in the chart. I have made referrals, counseling, and provided education to the patient based on review of the above and I have provided the patient with a written personalized care plan for preventive services.      Review of Systems     Objective:   Physical Exam        Assessment & Plan:

## 2013-02-26 NOTE — Assessment & Plan Note (Signed)
Annual wellness as documented. Pt has vision impairment, has upcoming appt with opthalmology, he does wear glasses but did not have them on during vision screen. Hearing is moderately impaired, no interest in further eval for hearing aids at this time  He needs to commit to regular physical activity

## 2013-02-26 NOTE — Patient Instructions (Addendum)
F/u in October 7 or after   Fasting lipid, cmp, HBA1c,  and TSh October 2 or after  Keep well, call if you need me before

## 2013-03-19 ENCOUNTER — Other Ambulatory Visit: Payer: Self-pay | Admitting: Family Medicine

## 2013-05-08 ENCOUNTER — Ambulatory Visit (INDEPENDENT_AMBULATORY_CARE_PROVIDER_SITE_OTHER): Payer: Medicare Other | Admitting: Family Medicine

## 2013-05-08 ENCOUNTER — Encounter: Payer: Self-pay | Admitting: Family Medicine

## 2013-05-08 VITALS — BP 120/60 | HR 97 | Resp 18 | Ht 72.0 in | Wt 203.1 lb

## 2013-05-08 DIAGNOSIS — M25471 Effusion, right ankle: Secondary | ICD-10-CM

## 2013-05-08 DIAGNOSIS — I10 Essential (primary) hypertension: Secondary | ICD-10-CM

## 2013-05-08 DIAGNOSIS — E785 Hyperlipidemia, unspecified: Secondary | ICD-10-CM

## 2013-05-08 DIAGNOSIS — J449 Chronic obstructive pulmonary disease, unspecified: Secondary | ICD-10-CM

## 2013-05-08 DIAGNOSIS — R7302 Impaired glucose tolerance (oral): Secondary | ICD-10-CM

## 2013-05-08 DIAGNOSIS — R7309 Other abnormal glucose: Secondary | ICD-10-CM

## 2013-05-08 DIAGNOSIS — M7989 Other specified soft tissue disorders: Secondary | ICD-10-CM

## 2013-05-08 MED ORDER — PREDNISONE (PAK) 5 MG PO TABS: 5.0000 mg | ORAL_TABLET | ORAL | Status: DC

## 2013-05-08 MED ORDER — METHYLPREDNISOLONE ACETATE 80 MG/ML IJ SUSP
80.0000 mg | Freq: Once | INTRAMUSCULAR | Status: AC
  Administered 2013-05-08: 80 mg via INTRAMUSCULAR

## 2013-05-08 MED ORDER — BUDESONIDE-FORMOTEROL FUMARATE 160-4.5 MCG/ACT IN AERO: 2.0000 | INHALATION_SPRAY | Freq: Two times a day (BID) | RESPIRATORY_TRACT | Status: DC

## 2013-05-08 NOTE — Patient Instructions (Addendum)
F/u in 4 weeks.  New for your breathing is symbicort 160 take 2 puffs twice every day, this is to improve your breathing. You will get 1 sample and the script is sent in, this is in place of advair. Use breathing treatments as directed, the whole vial when you do, stay on the machine till medication is done  Prednisone dose pack for 6 days is sent to help the swelling in the ankles, I believe that you have severe osteoarthritis in the ankles.  You DO NEED Xrays of both ankles and a CXR first week in July, xrays are ordered, just go to the xray dept please  Depomedrol 80mg  IM is being administered today in the office for your ankle swelling and deformity also

## 2013-05-08 NOTE — Progress Notes (Signed)
  Subjective:    Patient ID: Kyle Mosley, male    DOB: 1935-01-16, 78 y.o.   MRN: [id number]  HPI  The PT is here for follow up and re-evaluation of chronic medical conditions, medication management and review of any available recent lab and radiology data.  Preventive health is updated, specifically  Cancer screening and Immunization.   Questions or concerns regarding consultations or procedures which the PT has had in the interim are  addressed. Comes in today at the urging of his wife , the week before, because he is "not doing good" Noted to be increasingly tired, non compliant with neb treatments and supplemental oxygen Pt does admit to both, also has c/o bilateral ankle swelling and instability, no significant pain warmth or redness Admits to fatigue, denies PND , orthopnea or chest pain. Has significant ankle swelling and mild leg swelling approx 2 inch proximal to ankles   Review of Systems See HPI Denies recent fever or chills. Denies sinus pressure, nasal congestion, ear pain or sore throat. Chronic  Chest tightness and  wheezing.Worsening Denies chest pains, palpitations  Denies abdominal pain, nausea, vomiting,diarrhea or constipation.   Denies dysuria, frequency, hesitancy or incontinence. Denies headaches, seizures, numbness, or tingling. Denies depression, anxiety or insomnia. Denies skin break down or rash.        Objective:   Physical Exam Patient alert and oriented and in mild respiratory distress with minimal activity, and with speech HEENT: No facial asymmetry, EOMI, no sinus tenderness,  oropharynx pink and moist.  Neck supple no adenopathy.  Chest: decreased air entry throughout with wheezing CVS: S1, S2 no murmurs, no S3.  ABD: Soft non tender. Bowel sounds normal.  Ext: trace edema in lower extremities just up to approx 2 inches above ankles  MS: Adequate ROM spine, shoulders, hips and knees.  Skin: Intact, no ulcerations or rash noted.  Psych:  Good eye contact, normal affect. Memory intact not anxious or depressed appearing.  CNS: CN 2-12 intact, power, tone and sensation normal throughout.        Assessment & Plan:

## 2013-05-09 NOTE — Assessment & Plan Note (Signed)
Severe deformity with reported feeling of instability of both ankles, no significant warmth, erythema, or tenderness High dose steroid short term

## 2013-05-09 NOTE — Assessment & Plan Note (Signed)
Deterioration in pulmonary function noted. Pt refusing testing for use of portable oxygen which I believe he qualifies for. Has not been using the nocturnal oxygen prescribed, states it is too noisy, has not been taking neb treatments full, and has not been using symbicort, Educated again re need to change all these behaviors, states he will do so. Needs CXR , will obtain in next 2 weeks, when he feels "more able"

## 2013-05-09 NOTE — Assessment & Plan Note (Signed)
Controlled, no change in medication  

## 2013-05-09 NOTE — Assessment & Plan Note (Signed)
Well managed by diet only

## 2013-05-09 NOTE — Assessment & Plan Note (Signed)
Controlled, no change in medication Hyperlipidemia:Low fat diet discussed and encouraged.  \ 

## 2013-05-22 ENCOUNTER — Ambulatory Visit (HOSPITAL_COMMUNITY)
Admission: RE | Admit: 2013-05-22 | Discharge: 2013-05-22 | Disposition: A | Discharge status: Home / Self Care | Payer: Medicare Other | Source: Ambulatory Visit | Attending: Family Medicine | Admitting: Family Medicine

## 2013-05-22 DIAGNOSIS — J449 Chronic obstructive pulmonary disease, unspecified: Secondary | ICD-10-CM

## 2013-05-22 DIAGNOSIS — M7730 Calcaneal spur, unspecified foot: Secondary | ICD-10-CM | POA: Insufficient documentation

## 2013-05-22 DIAGNOSIS — R0602 Shortness of breath: Secondary | ICD-10-CM | POA: Insufficient documentation

## 2013-05-22 DIAGNOSIS — M25473 Effusion, unspecified ankle: Secondary | ICD-10-CM | POA: Insufficient documentation

## 2013-05-22 DIAGNOSIS — M25476 Effusion, unspecified foot: Secondary | ICD-10-CM | POA: Insufficient documentation

## 2013-05-22 DIAGNOSIS — M25472 Effusion, left ankle: Secondary | ICD-10-CM

## 2013-05-22 DIAGNOSIS — J4489 Other specified chronic obstructive pulmonary disease: Secondary | ICD-10-CM | POA: Insufficient documentation

## 2013-05-29 ENCOUNTER — Other Ambulatory Visit: Payer: Self-pay | Admitting: Family Medicine

## 2013-06-07 ENCOUNTER — Encounter: Payer: Self-pay | Admitting: Family Medicine

## 2013-06-07 ENCOUNTER — Ambulatory Visit (INDEPENDENT_AMBULATORY_CARE_PROVIDER_SITE_OTHER): Payer: Medicare Other | Admitting: Family Medicine

## 2013-06-07 VITALS — BP 120/74 | HR 76 | Resp 16 | Wt 201.0 lb

## 2013-06-07 DIAGNOSIS — M159 Polyosteoarthritis, unspecified: Secondary | ICD-10-CM

## 2013-06-07 DIAGNOSIS — R5383 Other fatigue: Secondary | ICD-10-CM

## 2013-06-07 DIAGNOSIS — I10 Essential (primary) hypertension: Secondary | ICD-10-CM

## 2013-06-07 DIAGNOSIS — R5381 Other malaise: Secondary | ICD-10-CM

## 2013-06-07 DIAGNOSIS — J45909 Unspecified asthma, uncomplicated: Secondary | ICD-10-CM

## 2013-06-07 DIAGNOSIS — J449 Chronic obstructive pulmonary disease, unspecified: Secondary | ICD-10-CM

## 2013-06-07 DIAGNOSIS — M150 Primary generalized (osteo)arthritis: Secondary | ICD-10-CM

## 2013-06-07 DIAGNOSIS — I272 Pulmonary hypertension, unspecified: Secondary | ICD-10-CM | POA: Insufficient documentation

## 2013-06-07 DIAGNOSIS — E785 Hyperlipidemia, unspecified: Secondary | ICD-10-CM

## 2013-06-07 DIAGNOSIS — J4540 Moderate persistent asthma, uncomplicated: Secondary | ICD-10-CM

## 2013-06-07 DIAGNOSIS — I2789 Other specified pulmonary heart diseases: Secondary | ICD-10-CM

## 2013-06-07 NOTE — Patient Instructions (Addendum)
F/u in early October, call if you need me before  You are being referred to Dr Juanetta Gosling for further evaluation and help with management of your lung disease.  I really do believe that the efficiency of  your breathing will be better with additional help from oxygen and maybe other medication  I am also referring you for an echocardiogram with the [facility] cardiologists  Joints are 150% better, we will hold off on any medication at this time

## 2013-06-08 ENCOUNTER — Encounter: Payer: Self-pay | Admitting: Cardiovascular Disease

## 2013-06-08 ENCOUNTER — Ambulatory Visit (INDEPENDENT_AMBULATORY_CARE_PROVIDER_SITE_OTHER): Payer: Medicare Other | Admitting: Cardiovascular Disease

## 2013-06-08 VITALS — BP 118/60 | HR 90 | Ht 72.0 in | Wt 198.0 lb

## 2013-06-08 DIAGNOSIS — R609 Edema, unspecified: Secondary | ICD-10-CM

## 2013-06-08 DIAGNOSIS — I2789 Other specified pulmonary heart diseases: Secondary | ICD-10-CM

## 2013-06-08 DIAGNOSIS — I10 Essential (primary) hypertension: Secondary | ICD-10-CM

## 2013-06-08 DIAGNOSIS — I272 Pulmonary hypertension, unspecified: Secondary | ICD-10-CM

## 2013-06-08 DIAGNOSIS — R600 Localized edema: Secondary | ICD-10-CM

## 2013-06-08 NOTE — Progress Notes (Signed)
Patient ID: Kyle Mosley, male   DOB: 07-Apr-1935, 78 y.o.   MRN: [id number]    CARDIOLOGY CONSULT NOTE  Patient ID: Kyle Mosley MRN: [id number] DOB/AGE: Dec 26, 1934 78 y.o.  Admit date: (Not on file) Primary Physician: Milus Mallick. Simpson Reason for Consultation: pulm HTN  HPI:  Kyle Mosley is a 78 y.o. Male with a PMH significant for HTN, pulmonary HTN, and hyperlipidemia, along with COPD.  He thinks he had an echocardiogram about 5 years ago. He denies chest pain and has chronic shortness of breath from COPD. He denies palpitations but does have leg swelling over the past 2 months. He denies syncope as well. 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  Review of systems complete and found to be negative unless listed above in HPI  Past Medical History: see HPI   Family History  Problem Relation Age of Onset  . Heart attack Father     History   Social History  . Marital Status: Married    Spouse Name: N/A    Number of Children: 3  . Years of Education: N/A   Occupational History  . retired     Social History Main Topics  . Smoking status: Former Games developer  . Smokeless tobacco: Not on file  . Alcohol Use: No  . Drug Use: No  . Sexually Active: Not on file   Other Topics Concern  . Not on file   Social History Narrative  . No narrative on file      Physical exam 118/60 mmHg HR: 90 bpm  General: NAD Neck: No JVD, no thyromegaly or thyroid nodule.  Lungs: some dry crackles at left base, diminished bilaterally without rales or frank wheezes CV: Nondisplaced PMI.  Heart regular S1/S2, no S3/S4, no murmur.  1+ pitting pedal edema.  No carotid bruit.  Normal pedal pulses.  Abdomen: Soft, nontender, no hepatosplenomegaly, no distention.  Skin: Intact without lesions or rashes.  Neurologic: Alert and oriented x 3.  Psych: Normal affect. Extremities: No clubbing or cyanosis.  HEENT: Normal.   Labs:   Lab Results  Component Value Date   WBC 6.9  02/20/2013   HGB 14.7 02/20/2013   HCT 44.9 02/20/2013   MCV 88.9 02/20/2013   PLT 330 02/20/2013   No results found for this basename: NA, K, CL, CO2, BUN, CREATININE, CALCIUM, LABALBU, PROT, BILITOT, ALKPHOS, ALT, AST, GLUCOSE,  in the last 168 hours No results found for this basename: CKTOTAL, CKMB, CKMBINDEX, TROPONINI    Lab Results  Component Value Date   CHOL 150 02/20/2013   CHOL 164 08/22/2012   CHOL 148 02/08/2012   Lab Results  Component Value Date   HDL 64 02/20/2013   HDL 60 08/22/2012   HDL 58 7/82/9562   Lab Results  Component Value Date   LDLCALC 78 02/20/2013   LDLCALC 94 08/22/2012   LDLCALC 80 02/08/2012   Lab Results  Component Value Date   TRIG 39 02/20/2013   TRIG 48 08/22/2012   TRIG 50 02/08/2012   Lab Results  Component Value Date   CHOLHDL 2.3 02/20/2013   CHOLHDL 2.7 08/22/2012   CHOLHDL 2.6 02/08/2012   No results found for this basename: LDLDIRECT       EKG: Sinus rhythm, rate 90 bpm, RBBB and LAFB     ASSESSMENT AND PLAN:  1. Pulmonary HTN: will evaluate with an echocardiogram, to see what his pressures are currently. 2. Leg edema: this  may be secondary to right-sided heart disease, given his chronic COPD and pulmonary HTN. This could also potentially be exacerbated by Amlodipine, but I will check to see when this was started.  Signed: Prentice Docker, M.D., F.A.C.C. 06/08/2013, 1:37 PM

## 2013-06-08 NOTE — Patient Instructions (Addendum)
Your physician recommends that you schedule a follow-up appointment in: ONE YEAR  Your physician has requested that you have an echocardiogram. Echocardiography is a painless test that uses sound waves to create images of your heart. It provides your doctor with information about the size and shape of your heart and how well your heart's chambers and valves are working. This procedure takes approximately one hour. There are no restrictions for this procedure.    

## 2013-06-09 NOTE — Assessment & Plan Note (Signed)
Improvement in joint swelling, no anti inflammatories or steroids at this time

## 2013-06-09 NOTE — Assessment & Plan Note (Signed)
Controlled, no change in medication Hyperlipidemia:Low fat diet discussed and encouraged.  \ 

## 2013-06-09 NOTE — Assessment & Plan Note (Signed)
Progressive deterioration, despite non use of nicotine, refer to pulmonary, pt finally agreeing to this

## 2013-06-09 NOTE — Assessment & Plan Note (Signed)
Controlled, no change in medication DASH diet and commitment to daily physical activity for a minimum of 30 minutes discussed and encouraged, as a part of hypertension management. The importance of attaining a healthy weight is also discussed.  

## 2013-06-09 NOTE — Assessment & Plan Note (Signed)
Progressively worsening due to pulmonary  disease

## 2013-06-09 NOTE — Assessment & Plan Note (Signed)
Currently stable on meds, no recent exaccerbation

## 2013-06-09 NOTE — Assessment & Plan Note (Signed)
Progressive deterioration in breathing, recent CXR describes pulmonary HTN. Pt has a longstanding h/o severe COPD/emphysema, needs pulmonary evaluation, also cardiology involvement, as he likely has strain on his heart also, from lung disease. He has resisted further eval in the past, but he is aware of deterioration in his exercise tolerance and is now willing to move forward. Had ben placed on nocturnal oxygen , which he returned, states he just "hates the idea of having to depend on oxygen"

## 2013-06-09 NOTE — Progress Notes (Signed)
  Subjective:    Patient ID: Kyle Mosley, male    DOB: [birth year], 78 y.o.   MRN: [id number]  HPI Pt in for f/u severe COPD with pulmonary HTN radiographicaly, also f/u osteoarthritis flare, with swelling of hands and ankles. The joints are much less swollen, he is afraid of rapid recurrence of medication, however, I am holding any unneccesary medication at this time, until his cadiopulmonary status is fully evaluated After much discussion, Kyle Mosley is finally agreeing to have pulmonary and cardiology evaluations, he understandings that I am concerned, he was prescribed supplemental oxygen for nocturnal use, returned it and his greatest "fear " is having to rely on oxygen, which i believe that he already does. I spoke directly with the pulmonologist about him, and he will move forward to get the care he needs. Denies fever, chills, sputum production, has chronic fatigue , and is intolerant of activity due too shortness of breath   Review of Systems See HPI Denies recent fever or chills. Denies sinus pressure, nasal congestion, ear pain or sore throat. Denies chest pains or  palpitations Denies abdominal pain, nausea, vomiting,diarrhea or constipation.   Denies dysuria, frequency, hesitancy or incontinence. Denies headaches, seizures, numbness, or tingling. Denies depression, anxiety or insomnia. Denies skin break down or rash.        Objective:   Physical Exam Patient alert and oriented and in no cardiopulmonary distress at rest  HEENT: No facial asymmetry, EOMI, no sinus tenderness,  oropharynx pink and moist.  Neck supple no adenopathy.  Chest: Clear to auscultation bilaterally. Decreased air entry throughout, no crackles or wheezes  CVS: S1, S2 no murmurs, no S3.  ABD: Soft non tender. Bowel sounds normal.  Ext: No edema  MS: Decreased  ROM spine, shoulders, hips and knees.Swelling and deformity of ankles and knees  Skin: Intact, no ulcerations or rash noted.  Psych:  Good eye contact, normal affect. Memory intact not anxious or depressed appearing.  CNS: CN 2-12 intact, power, tone and sensation normal throughout.        Assessment & Plan:

## 2013-06-19 ENCOUNTER — Encounter (HOSPITAL_COMMUNITY)
Admission: RE | Admit: 2013-06-19 | Discharge: 2013-06-19 | Disposition: A | Discharge status: Home / Self Care | Payer: Medicare Other | Source: Ambulatory Visit | Attending: Cardiovascular Disease | Admitting: Cardiovascular Disease

## 2013-06-19 DIAGNOSIS — I272 Pulmonary hypertension, unspecified: Secondary | ICD-10-CM

## 2013-06-19 DIAGNOSIS — J4489 Other specified chronic obstructive pulmonary disease: Secondary | ICD-10-CM | POA: Insufficient documentation

## 2013-06-19 DIAGNOSIS — I10 Essential (primary) hypertension: Secondary | ICD-10-CM | POA: Insufficient documentation

## 2013-06-19 DIAGNOSIS — J449 Chronic obstructive pulmonary disease, unspecified: Secondary | ICD-10-CM | POA: Insufficient documentation

## 2013-06-19 DIAGNOSIS — I517 Cardiomegaly: Secondary | ICD-10-CM

## 2013-06-19 DIAGNOSIS — R600 Localized edema: Secondary | ICD-10-CM

## 2013-06-19 NOTE — Progress Notes (Signed)
*  PRELIMINARY RESULTS* Echocardiogram 2D Echocardiogram has been performed.  Kyle Mosley 06/19/2013, 9:51 AM

## 2013-08-14 ENCOUNTER — Other Ambulatory Visit: Payer: Self-pay | Admitting: Family Medicine

## 2013-08-28 ENCOUNTER — Other Ambulatory Visit: Payer: Self-pay | Admitting: Family Medicine

## 2013-08-28 LAB — LIPID PANEL
HDL: 58 mg/dL (ref >39)
LDL Cholesterol: 82 mg/dL (ref 0–99)
Total CHOL/HDL Ratio: 2.6 Ratio
Triglycerides: 51 mg/dL (ref <150)

## 2013-08-28 LAB — COMPREHENSIVE METABOLIC PANEL
AST: 18 U/L (ref 0–37)
Albumin: 3 g/dL — ABNORMAL LOW (ref 3.5–5.2)
Alkaline Phosphatase: 73 U/L (ref 39–117)
BUN: 17 mg/dL (ref 6–23)
Creat: 0.84 mg/dL (ref 0.50–1.35)
Glucose, Bld: 92 mg/dL (ref 70–99)

## 2013-08-28 LAB — TSH: TSH: 1.91 u[IU]/mL (ref 0.350–4.500)

## 2013-09-03 ENCOUNTER — Telehealth: Payer: Self-pay | Admitting: Family Medicine

## 2013-09-03 ENCOUNTER — Other Ambulatory Visit: Payer: Self-pay | Admitting: Family Medicine

## 2013-09-03 NOTE — Telephone Encounter (Signed)
Noted and changed in the chart

## 2013-09-04 ENCOUNTER — Other Ambulatory Visit: Payer: Self-pay | Admitting: Family Medicine

## 2013-09-06 ENCOUNTER — Ambulatory Visit (INDEPENDENT_AMBULATORY_CARE_PROVIDER_SITE_OTHER): Payer: Medicare Other | Admitting: Family Medicine

## 2013-09-06 ENCOUNTER — Encounter (INDEPENDENT_AMBULATORY_CARE_PROVIDER_SITE_OTHER): Payer: Self-pay

## 2013-09-06 ENCOUNTER — Encounter: Payer: Self-pay | Admitting: Family Medicine

## 2013-09-06 VITALS — BP 110/70 | HR 89 | Resp 16 | Ht 72.0 in | Wt 199.1 lb

## 2013-09-06 DIAGNOSIS — E785 Hyperlipidemia, unspecified: Secondary | ICD-10-CM

## 2013-09-06 DIAGNOSIS — R29818 Other symptoms and signs involving the nervous system: Secondary | ICD-10-CM

## 2013-09-06 DIAGNOSIS — J449 Chronic obstructive pulmonary disease, unspecified: Secondary | ICD-10-CM

## 2013-09-06 DIAGNOSIS — R296 Repeated falls: Secondary | ICD-10-CM

## 2013-09-06 DIAGNOSIS — R7302 Impaired glucose tolerance (oral): Secondary | ICD-10-CM

## 2013-09-06 DIAGNOSIS — I10 Essential (primary) hypertension: Secondary | ICD-10-CM

## 2013-09-06 DIAGNOSIS — R7309 Other abnormal glucose: Secondary | ICD-10-CM

## 2013-09-06 MED ORDER — LOVASTATIN 40 MG PO TABS: 40.0000 mg | ORAL_TABLET | Freq: Every day | ORAL | Status: DC

## 2013-09-06 NOTE — Patient Instructions (Addendum)
F/u in 4.5 month  Call if you need me before  Use breathing treatments at 6am, 2pm and 10pm , ALL OF THE MEDICATION in the vial each time  Call next week to be tested for supplemental oxygen at night  Please sleep with the bed beside the wall, so you do not fall out

## 2013-09-08 DIAGNOSIS — R296 Repeated falls: Secondary | ICD-10-CM | POA: Insufficient documentation

## 2013-09-08 NOTE — Assessment & Plan Note (Signed)
Severe disease, would certainly benefit from oxygen , however continues to resist. Pt re educated about correct use of neb treatment, taking entire dose of med on a schedule, states he will comply

## 2013-09-08 NOTE — Assessment & Plan Note (Signed)
Controlled, no change in medication  

## 2013-09-08 NOTE — Assessment & Plan Note (Signed)
Controlled, no change in medication Hyperlipidemia:Low fat diet discussed and encouraged.  \ 

## 2013-09-08 NOTE — Progress Notes (Signed)
  Subjective:    Patient ID: Kyle Mosley, male    DOB: 1935/08/30, 78 y.o.   MRN: [id number]  HPI The PT is here for follow up and re-evaluation of chronic medical conditions, medication management and review of any available recent lab and radiology data. Wife accompanies him Stated concerns are incorrect use of neb treatments, not taking a complete dose, taking "a hit" then up in the night taking treatments. Also thin skin with h/o falling off bed, moves around a lot in his sleep reporredly Preventive health is updated, specifically  Cancer screening and Immunization.   Questions or concerns regarding consultations or procedures which the PT has had in the interim are  Addressed.Has been evaluated both by cardiology and respiratory since last visit. States he did not hear anything new, and still insists than when oxygen is needed "you are on your last leg"Still unwilling to start oxygen The PT denies any adverse reactions to current medications since the last visit.        Review of Systems See HPI Denies recent fever or chills. Denies sinus pressure, nasal congestion, ear pain or sore throat. Chronic shortness of breath,, productive cough and  wheezing. Denies chest pains, palpitations and leg swelling Denies abdominal pain, nausea, vomiting,diarrhea or constipation.   Denies dysuria, frequency, hesitancy or incontinence. Chronic  joint pain, and limitation in mobility. Denies headaches, seizures, numbness, or tingling. Denies depression, anxiety or insomnia.        Objective:   Physical Exam  Patient alert and oriented and in no cardiopulmonary distres at rest, however extremely poor exercise tolerance.  HEENT: No facial asymmetry, EOMI, no sinus tenderness,  oropharynx pink and moist.  Neck supple no adenopathy.  Chest: Clear to auscultation bilaterally.Markedly reduced air entry throughout  CVS: S1, S2 no murmurs, no S3.  ABD: Soft non tender. Bowel sounds  normal.  Ext: No edema  MS: Adequate though reduced ROM spine, shoulders, hips and knees.  Skin: healing abrasion on right forearm , no rash, skin dry  Psych: Good eye contact, normal affect. Memory intact not anxious or depressed appearing.  CNS: CN 2-12 intact, power, tone and sensation normal throughout.       Assessment & Plan:

## 2013-09-08 NOTE — Assessment & Plan Note (Signed)
Sleeping arrangement to be changed, so that pt sleeps against the wall with wife at outer end of bed, bed to be pushed against wall

## 2013-09-08 NOTE — Assessment & Plan Note (Signed)
Unchanged over a 1 year period Patient educated about the importance of limiting  Carbohydrate intake , the need to commit to daily physical activity for a minimum of 20 minutes , a The fact that changes in all these areas will reduce or eliminate all together the development of diabetes is stressed.

## 2013-12-10 ENCOUNTER — Other Ambulatory Visit: Payer: Self-pay | Admitting: Family Medicine

## 2013-12-17 ENCOUNTER — Other Ambulatory Visit: Payer: Self-pay | Admitting: Family Medicine

## 2014-01-21 ENCOUNTER — Other Ambulatory Visit: Payer: Self-pay | Admitting: Family Medicine

## 2014-01-24 ENCOUNTER — Ambulatory Visit (INDEPENDENT_AMBULATORY_CARE_PROVIDER_SITE_OTHER): Payer: Medicare HMO | Admitting: Family Medicine

## 2014-01-24 ENCOUNTER — Encounter (INDEPENDENT_AMBULATORY_CARE_PROVIDER_SITE_OTHER): Payer: Self-pay

## 2014-01-24 ENCOUNTER — Encounter: Payer: Self-pay | Admitting: Family Medicine

## 2014-01-24 VITALS — BP 140/82 | HR 90 | Resp 16 | Ht 72.0 in | Wt 197.1 lb

## 2014-01-24 DIAGNOSIS — R5383 Other fatigue: Secondary | ICD-10-CM

## 2014-01-24 DIAGNOSIS — I272 Pulmonary hypertension, unspecified: Secondary | ICD-10-CM

## 2014-01-24 DIAGNOSIS — R7302 Impaired glucose tolerance (oral): Secondary | ICD-10-CM

## 2014-01-24 DIAGNOSIS — M949 Disorder of cartilage, unspecified: Secondary | ICD-10-CM

## 2014-01-24 DIAGNOSIS — R296 Repeated falls: Secondary | ICD-10-CM

## 2014-01-24 DIAGNOSIS — M899 Disorder of bone, unspecified: Secondary | ICD-10-CM

## 2014-01-24 DIAGNOSIS — R7301 Impaired fasting glucose: Secondary | ICD-10-CM

## 2014-01-24 DIAGNOSIS — R5381 Other malaise: Secondary | ICD-10-CM

## 2014-01-24 DIAGNOSIS — I10 Essential (primary) hypertension: Secondary | ICD-10-CM

## 2014-01-24 DIAGNOSIS — J209 Acute bronchitis, unspecified: Secondary | ICD-10-CM

## 2014-01-24 DIAGNOSIS — R29818 Other symptoms and signs involving the nervous system: Secondary | ICD-10-CM

## 2014-01-24 DIAGNOSIS — R7309 Other abnormal glucose: Secondary | ICD-10-CM

## 2014-01-24 DIAGNOSIS — J449 Chronic obstructive pulmonary disease, unspecified: Secondary | ICD-10-CM

## 2014-01-24 DIAGNOSIS — E785 Hyperlipidemia, unspecified: Secondary | ICD-10-CM

## 2014-01-24 DIAGNOSIS — I2789 Other specified pulmonary heart diseases: Secondary | ICD-10-CM

## 2014-01-24 MED ORDER — HYDROCHLOROTHIAZIDE 25 MG PO TABS
ORAL_TABLET | ORAL | Status: DC
Start: 2014-01-24 — End: 2014-07-25

## 2014-01-24 MED ORDER — RANITIDINE HCL 150 MG PO TABS: ORAL_TABLET | ORAL | Status: DC

## 2014-01-24 MED ORDER — PENICILLIN V POTASSIUM 500 MG PO TABS: 500.0000 mg | ORAL_TABLET | Freq: Three times a day (TID) | ORAL | Status: DC

## 2014-01-24 NOTE — Patient Instructions (Addendum)
Annual wellness with rectal in 4 month  HAPPY BIRTHDAY  To both you and Mrs. Tellefsen  CBC, fasting lipid, cmp and vit D first week in April, we will call if labs are abnormal  Penicillin sent in for 10 days for chest congestion  Ranitidine is good at once daily  Please collect all medication you need your blood pressure pillFall Prevention and Home Safety Falls cause injuries and can affect all age groups. It is possible to prevent falls.  HOW TO PREVENT FALLS  Wear shoes with rubber soles that do not have an opening for your toes.  Keep the inside and outside of your house well lit.  Use night lights throughout your home.  Remove clutter from floors.  Clean up floor spills.  Remove throw rugs or fasten them to the floor with carpet tape.  Do not place electrical cords across pathways.  Put grab bars by your tub, shower, and toilet. Do not use towel bars as grab bars.  Put handrails on both sides of the stairway. Fix loose handrails.  Do not climb on stools or stepladders, if possible.  Do not wax your floors.  Repair uneven or unsafe sidewalks, walkways, or stairs.  Keep items you use a lot within reach.  Be aware of pets.  Keep emergency numbers next to the telephone.  Put smoke detectors in your home and near bedrooms. Ask your doctor what other things you can do to prevent falls. Document Released: 09/04/2009 Document Revised: 05/09/2012 Document Reviewed: 02/08/2012 [facility] Patient Information 2014 [place], [place].

## 2014-01-27 NOTE — Assessment & Plan Note (Signed)
Safety discussed and denies any falls since last visit

## 2014-01-27 NOTE — Progress Notes (Signed)
   Subjective:    Patient ID: Kyle Mosley, male    DOB: 1935/05/24, 79 y.o.   MRN: [id number]  HPI  The PT is here for follow up and re-evaluation of chronic medical conditions, medication management and review of any available recent lab and radiology data.  Preventive health is updated, specifically  Cancer screening and Immunization.   Questions or concerns regarding consultations or procedures which the PT has had in the interim are  Addressed.pt has seen pulmonary who also recommended oxygen but he continues to resist this, saying "not until absolutely necessary" which it currently is, but he is in denial still. Recently saw urology and got a good report The PT denies any adverse reactions to current medications since the last visit.  2week h/o increased cough and chest congestion, yellow sputum in the past 5 days, no fever but chills and increased shortness of breath      Review of Systems See HPI Denies recent fever or chills. Denies sinus pressure, nasal congestion, ear pain or sore throat.  Denies chest pains, palpitations and leg swelling Denies abdominal pain, nausea, vomiting,diarrhea or constipation.   Denies dysuria, frequency, hesitancy or incontinence. Denies joint pain, swelling and limitation in mobility. Denies headaches, seizures, numbness, or tingling. Denies depression, anxiety or insomnia. Denies skin break down or rash.        Objective:   Physical Exam  BP 140/82  Pulse 90  Resp 16  Ht 6' (1.829 m)  Wt 197 lb 1.9 oz (89.413 kg)  BMI 26.73 kg/m2  SpO2 89% Patient alert and oriented and in moderate pulmonary distress with minimal exertion  HEENT: No facial asymmetry, EOMI, no sinus tenderness,  oropharynx pink and moist.  Neck supple no adenopathy.  Chest: decreased air entry throughout , wheezes high pitched, no crackles   CVS: S1, S2 no murmurs, no S3.  ABD: Soft non tender. Bowel sounds normal.  Ext: No edema  MS: Adequate though  reduced  ROM spine, shoulders, hips and knees.  Skin: Intact, no ulcerations or rash noted.  Psych: Good eye contact, normal affect. Memory intact not anxious or depressed appearing.  CNS: CN 2-12 intact, power, tone and sensation normal throughout.       Assessment & Plan:  HYPERTENSION Not at goal, pt out of HCTZ which he will collect today DASH diet and commitment to daily physical activity for a minimum of 30 minutes discussed and encouraged, as a part of hypertension management. The importance of attaining a healthy weight is also discussed.   Pulmonary HTN Worsening, as pt continues to refuse oxygen, will continue to educate in the hope he will change  COPD (chronic obstructive pulmonary disease) Severe, needs supplemental oxygen but refusing still, reports improved breathing now that he is using neb treatments on scheduled basis  Acute bronchitis Antibiotic course prescribed  IGT (impaired glucose tolerance) Patient educated about the importance of limiting  Carbohydrate intake , the need to commit to daily physical activity for a minimum of 30 minutes , and to commit weight loss. The fact that changes in all these areas will reduce or eliminate all together the development of diabetes is stressed.   Updated lab needed at/ before next visit.   HYPERLIPIDEMIA Hyperlipidemia:Low fat diet discussed and encouraged.  Updated lab needed at/ before next visit.   Repeated falls Safety discussed and denies any falls since last visit

## 2014-01-27 NOTE — Assessment & Plan Note (Signed)
Worsening, as pt continues to refuse oxygen, will continue to educate in the hope he will change

## 2014-01-27 NOTE — Assessment & Plan Note (Signed)
Not at goal, pt out of HCTZ which he will collect today DASH diet and commitment to daily physical activity for a minimum of 30 minutes discussed and encouraged, as a part of hypertension management. The importance of attaining a healthy weight is also discussed.

## 2014-01-27 NOTE — Assessment & Plan Note (Signed)
Hyperlipidemia:Low fat diet discussed and encouraged.  Updated lab needed at/ before next visit.  

## 2014-01-27 NOTE — Assessment & Plan Note (Signed)
Severe, needs supplemental oxygen but refusing still, reports improved breathing now that he is using neb treatments on scheduled basis

## 2014-01-27 NOTE — Assessment & Plan Note (Signed)
Antibiotic course prescribed 

## 2014-01-27 NOTE — Assessment & Plan Note (Signed)
Patient educated about the importance of limiting  Carbohydrate intake , the need to commit to daily physical activity for a minimum of 30 minutes , and to commit weight loss. The fact that changes in all these areas will reduce or eliminate all together the development of diabetes is stressed.   Updated lab needed at/ before next visit.  

## 2014-02-21 LAB — CBC WITH DIFFERENTIAL/PLATELET
BASOS ABS: 0 10*3/uL (ref 0.0–0.1)
BASOS PCT: 0 % (ref 0–1)
Eosinophils Absolute: 0.3 10*3/uL (ref 0.0–0.7)
Eosinophils Relative: 4 % (ref 0–5)
HCT: 42.1 % (ref 39.0–52.0)
HEMOGLOBIN: 13.9 g/dL (ref 13.0–17.0)
Lymphocytes Relative: 14 % (ref 12–46)
Lymphs Abs: 1.1 10*3/uL (ref 0.7–4.0)
MCH: 29.1 pg (ref 26.0–34.0)
MCHC: 33 g/dL (ref 30.0–36.0)
MCV: 88.3 fL (ref 78.0–100.0)
MONOS PCT: 9 % (ref 3–12)
Monocytes Absolute: 0.7 10*3/uL (ref 0.1–1.0)
NEUTROS ABS: 5.9 10*3/uL (ref 1.7–7.7)
NEUTROS PCT: 73 % (ref 43–77)
Platelets: 307 10*3/uL (ref 150–400)
RBC: 4.77 MIL/uL (ref 4.22–5.81)
RDW: 15.1 % (ref 11.5–15.5)
WBC: 8.1 10*3/uL (ref 4.0–10.5)

## 2014-02-22 LAB — COMPREHENSIVE METABOLIC PANEL
ALT: 10 U/L (ref 0–53)
AST: 18 U/L (ref 0–37)
Albumin: 2.9 g/dL — ABNORMAL LOW (ref 3.5–5.2)
Alkaline Phosphatase: 66 U/L (ref 39–117)
BUN: 20 mg/dL (ref 6–23)
CALCIUM: 8.7 mg/dL (ref 8.4–10.5)
CHLORIDE: 100 meq/L (ref 96–112)
CO2: 32 meq/L (ref 19–32)
CREATININE: 0.87 mg/dL (ref 0.50–1.35)
GLUCOSE: 84 mg/dL (ref 70–99)
Potassium: 4.4 mEq/L (ref 3.5–5.3)
Sodium: 138 mEq/L (ref 135–145)
Total Bilirubin: 0.5 mg/dL (ref 0.2–1.2)
Total Protein: 6.1 g/dL (ref 6.0–8.3)

## 2014-02-22 LAB — LIPID PANEL
CHOLESTEROL: 141 mg/dL (ref 0–200)
HDL: 65 mg/dL (ref >39)
LDL Cholesterol: 68 mg/dL (ref 0–99)
TRIGLYCERIDES: 38 mg/dL (ref <150)
Total CHOL/HDL Ratio: 2.2 Ratio
VLDL: 8 mg/dL (ref 0–40)

## 2014-02-22 LAB — VITAMIN D 25 HYDROXY (VIT D DEFICIENCY, FRACTURES): VIT D 25 HYDROXY: 14 ng/mL — AB (ref 30–89)

## 2014-03-04 ENCOUNTER — Other Ambulatory Visit: Payer: Self-pay | Admitting: Family Medicine

## 2014-03-04 ENCOUNTER — Other Ambulatory Visit: Payer: Self-pay

## 2014-03-04 MED ORDER — VITAMIN D (ERGOCALCIFEROL) 1.25 MG (50000 UNIT) PO CAPS: 50000.0000 [IU] | ORAL_CAPSULE | ORAL | Status: DC

## 2014-04-08 ENCOUNTER — Other Ambulatory Visit: Payer: Self-pay | Admitting: Family Medicine

## 2014-04-17 NOTE — Telephone Encounter (Signed)
Error

## 2014-04-24 ENCOUNTER — Other Ambulatory Visit: Payer: Self-pay | Admitting: Family Medicine

## 2014-05-20 ENCOUNTER — Telehealth: Payer: Self-pay

## 2014-05-20 NOTE — Telephone Encounter (Signed)
Absolutely script written pls fax

## 2014-05-20 NOTE — Telephone Encounter (Signed)
Patient aware and supporting notes faxed to [facility]

## 2014-05-20 NOTE — Telephone Encounter (Signed)
May I have script for this.  Will look for appropriate documentation.

## 2014-05-21 ENCOUNTER — Other Ambulatory Visit: Payer: Self-pay | Admitting: Family Medicine

## 2014-05-27 ENCOUNTER — Ambulatory Visit (INDEPENDENT_AMBULATORY_CARE_PROVIDER_SITE_OTHER): Payer: Medicare HMO | Admitting: Family Medicine

## 2014-05-27 ENCOUNTER — Encounter: Payer: Self-pay | Admitting: Family Medicine

## 2014-05-27 ENCOUNTER — Encounter (INDEPENDENT_AMBULATORY_CARE_PROVIDER_SITE_OTHER): Payer: Self-pay

## 2014-05-27 VITALS — BP 148/74 | HR 72 | Resp 18 | Ht 72.0 in | Wt 195.0 lb

## 2014-05-27 DIAGNOSIS — H547 Unspecified visual loss: Secondary | ICD-10-CM | POA: Insufficient documentation

## 2014-05-27 DIAGNOSIS — Z23 Encounter for immunization: Secondary | ICD-10-CM

## 2014-05-27 DIAGNOSIS — I10 Essential (primary) hypertension: Secondary | ICD-10-CM

## 2014-05-27 DIAGNOSIS — M949 Disorder of cartilage, unspecified: Secondary | ICD-10-CM

## 2014-05-27 DIAGNOSIS — R7302 Impaired glucose tolerance (oral): Secondary | ICD-10-CM

## 2014-05-27 DIAGNOSIS — E785 Hyperlipidemia, unspecified: Secondary | ICD-10-CM

## 2014-05-27 DIAGNOSIS — Z0000 Encounter for general adult medical examination without abnormal findings: Secondary | ICD-10-CM

## 2014-05-27 DIAGNOSIS — M899 Disorder of bone, unspecified: Secondary | ICD-10-CM

## 2014-05-27 MED ORDER — AMLODIPINE BESYLATE 10 MG PO TABS: ORAL_TABLET | ORAL | Status: DC

## 2014-05-27 NOTE — Progress Notes (Signed)
Subjective:    Patient ID: Kyle Mosley, male    DOB: 1935/08/24, 79 y.o.   MRN: [id number]  HPI Preventive Screening-Counseling & Management   Patient present here today for a subsequent Medicare annual wellness visit.   Current Problems (verified)   Medications Prior to Visit Allergies (verified)   PAST HISTORY  Family History: reviewed as documented  Social History Married for 47 years, father of 3 sons, reitered at age 31, no drug use   Risk Factors  Current exercise habits:  wlaks for approx 15 mins daily, encouraged to do two 12 min sessions daily, as able  Dietary issues discussed:heart healthy, low carb   Cardiac risk factors: none ignificant  Depression Screen  (Note: if answer to either of the following is "Yes", a more complete depression screening is indicated)   Over the past two weeks, have you felt down, depressed or hopeless? No  Over the past two weeks, have you felt little interest or pleasure in doing things? No  Have you lost interest or pleasure in daily life? No  Do you often feel hopeless? No  Do you cry easily over simple problems? No   Activities of Daily Living  In your present state of health, do you have any difficulty performing the following activities?  Driving?: No Managing money?: No Feeding yourself?:No Getting from bed to chair?:No Climbing a flight of stairs?:yes due to cOPD Preparing food and eating?:No Bathing or showering?:yes difficulty getting out of the tub due to back pain Getting dressed?:yes at times, due to dyspnea Getting to the toilet?:No Using the toilet?:No Moving around from place to place?: yes due to  Difficulty breathing , but able to function  Fall Risk Assessment In the past year have you fallen or had a near fall?:yes rolled out of bed, still not propped at the ends as I had previously advised Are you currently taking any medications that make you dizzy?:No   Hearing Difficulties: No Do you often ask  people to speak up or repeat themselves?:No Do you experience ringing or noises in your ears?:No Do you have difficulty understanding soft or whispered voices?:No  Cognitive Testing  Alert? Yes Normal Appearance?Yes  Oriented to person? Yes Place? Yes  Time? Yes  Displays appropriate judgment?Yes  Can read the correct time from a watch face? yes Are you having problems remembering things?No  Advanced Directives have been discussed with the patient?Yes , full code, needs advanced directives documented, info provided at visit   List the Names of Other Physician/Practitioners you currently use:    Indicate any recent Medical Services you may have received from other than [facility] providers in the past year (date may be approximate).   Assessment:    Annual Wellness Exam   Plan:        .  Medicare Attestation  I have personally reviewed:  The patient's medical and social history  Their use of alcohol, tobacco or illicit drugs  Their current medications and supplements  The patient's functional ability including ADLs,fall risks, home safety risks, cognitive, and hearing and visual impairment  Diet and physical activities  Evidence for depression or mood disorders  The patient's weight, height, BMI, and visual acuity have been recorded in the chart. I have made referrals, counseling, and provided education to the patient based on review of the above and I have provided the patient with a written personalized care plan for preventive services.      Review of Systems  Objective:   Physical Exam        Assessment & Plan:  Medicare annual wellness visit, subsequent Annual exam as documented. Counseling done  re healthy lifestyle involving commitment to 150 minutes exercise per week, heart healthy diet, and attaining healthy weight.The importance of adequate sleep also discussed. Regular seat belt use and safe storage  of firearms if patient has them, is also  discussed. Changes in health habits are decided on by the patient with goals and time frames  set for achieving them. Immunization and cancer screening needs are specifically addressed at this visit.   Need for vaccination with 13-polyvalent pneumococcal conjugate vaccine Vaccine administered at visit

## 2014-05-27 NOTE — Patient Instructions (Addendum)
Annual physical exam in early november, pls call if you need me before  Pls be careful not to fall  Prevnar today  Please commit to daily physical activity for 30 mins total for at least 5 days per week  You are referred for eye exam  Continue healthy diet low in fat and sugar  and high in vegetable  Fasting lipid, cmp , hBA1C , vit D andf tSH end october

## 2014-06-12 ENCOUNTER — Other Ambulatory Visit: Payer: Self-pay | Admitting: Family Medicine

## 2014-06-16 ENCOUNTER — Encounter: Payer: Self-pay | Admitting: Family Medicine

## 2014-06-16 DIAGNOSIS — Z0000 Encounter for general adult medical examination without abnormal findings: Secondary | ICD-10-CM | POA: Insufficient documentation

## 2014-06-16 DIAGNOSIS — Z23 Encounter for immunization: Secondary | ICD-10-CM | POA: Insufficient documentation

## 2014-06-16 NOTE — Assessment & Plan Note (Signed)
Annual exam as documented. Counseling done  re healthy lifestyle involving commitment to 150 minutes exercise per week, heart healthy diet, and attaining healthy weight.The importance of adequate sleep also discussed. Regular seat belt use and safe storage  of firearms if patient has them, is also discussed. Changes in health habits are decided on by the patient with goals and time frames  set for achieving them. Immunization and cancer screening needs are specifically addressed at this visit.  

## 2014-06-16 NOTE — Assessment & Plan Note (Signed)
Vaccine administered at visit.  

## 2014-07-12 ENCOUNTER — Other Ambulatory Visit: Payer: Self-pay | Admitting: Family Medicine

## 2014-07-25 ENCOUNTER — Other Ambulatory Visit: Payer: Self-pay

## 2014-07-25 MED ORDER — HYDROCHLOROTHIAZIDE 25 MG PO TABS: ORAL_TABLET | ORAL | Status: DC

## 2014-09-13 ENCOUNTER — Other Ambulatory Visit: Payer: Self-pay | Admitting: Family Medicine

## 2014-09-20 ENCOUNTER — Other Ambulatory Visit: Payer: Self-pay

## 2014-09-20 MED ORDER — LOVASTATIN 40 MG PO TABS: ORAL_TABLET | ORAL | Status: DC

## 2014-10-04 ENCOUNTER — Other Ambulatory Visit: Payer: Self-pay | Admitting: Family Medicine

## 2014-10-14 ENCOUNTER — Encounter: Payer: Medicare HMO | Admitting: Family Medicine

## 2014-10-21 ENCOUNTER — Other Ambulatory Visit: Payer: Self-pay | Admitting: Family Medicine

## 2014-10-22 LAB — COMPREHENSIVE METABOLIC PANEL
ALT: 19 U/L (ref 0–53)
AST: 25 U/L (ref 0–37)
Albumin: 3 g/dL — ABNORMAL LOW (ref 3.5–5.2)
Alkaline Phosphatase: 86 U/L (ref 39–117)
BUN: 24 mg/dL — ABNORMAL HIGH (ref 6–23)
CALCIUM: 8.5 mg/dL (ref 8.4–10.5)
CHLORIDE: 100 meq/L (ref 96–112)
CO2: 33 meq/L — AB (ref 19–32)
Creat: 0.96 mg/dL (ref 0.50–1.35)
Glucose, Bld: 100 mg/dL — ABNORMAL HIGH (ref 70–99)
Potassium: 4.5 mEq/L (ref 3.5–5.3)
SODIUM: 139 meq/L (ref 135–145)
TOTAL PROTEIN: 6.2 g/dL (ref 6.0–8.3)
Total Bilirubin: 0.3 mg/dL (ref 0.2–1.2)

## 2014-10-22 LAB — TSH: TSH: 2.394 u[IU]/mL (ref 0.350–4.500)

## 2014-10-22 LAB — LIPID PANEL
CHOLESTEROL: 135 mg/dL (ref 0–200)
HDL: 63 mg/dL (ref >39)
LDL Cholesterol: 64 mg/dL (ref 0–99)
TRIGLYCERIDES: 40 mg/dL (ref <150)
Total CHOL/HDL Ratio: 2.1 Ratio
VLDL: 8 mg/dL (ref 0–40)

## 2014-10-22 LAB — HEMOGLOBIN A1C
Hgb A1c MFr Bld: 5.7 % — ABNORMAL HIGH (ref <5.7)
Mean Plasma Glucose: 117 mg/dL — ABNORMAL HIGH (ref <117)

## 2014-10-22 LAB — VITAMIN D 25 HYDROXY (VIT D DEFICIENCY, FRACTURES): VIT D 25 HYDROXY: 13 ng/mL — AB (ref 30–100)

## 2014-10-30 ENCOUNTER — Other Ambulatory Visit: Payer: Self-pay

## 2014-10-30 MED ORDER — VITAMIN D (ERGOCALCIFEROL) 1.25 MG (50000 UNIT) PO CAPS: 50000.0000 [IU] | ORAL_CAPSULE | ORAL | Status: DC

## 2015-01-06 ENCOUNTER — Encounter: Payer: Medicare HMO | Admitting: Family Medicine

## 2015-02-13 ENCOUNTER — Other Ambulatory Visit: Payer: Self-pay | Admitting: Family Medicine

## 2015-03-04 ENCOUNTER — Encounter: Payer: Medicare HMO | Admitting: Family Medicine

## 2015-03-04 ENCOUNTER — Encounter (HOSPITAL_COMMUNITY): Payer: Self-pay | Admitting: Emergency Medicine

## 2015-03-04 ENCOUNTER — Emergency Department (HOSPITAL_COMMUNITY)
Admission: EM | Admit: 2015-03-04 | Discharge: 2015-03-04 | Disposition: A | Discharge status: Home / Self Care | Payer: Medicare HMO | Attending: Emergency Medicine | Admitting: Emergency Medicine

## 2015-03-04 DIAGNOSIS — Z7982 Long term (current) use of aspirin: Secondary | ICD-10-CM | POA: Insufficient documentation

## 2015-03-04 DIAGNOSIS — Z87448 Personal history of other diseases of urinary system: Secondary | ICD-10-CM | POA: Diagnosis not present

## 2015-03-04 DIAGNOSIS — Z7952 Long term (current) use of systemic steroids: Secondary | ICD-10-CM | POA: Diagnosis not present

## 2015-03-04 DIAGNOSIS — Y9389 Activity, other specified: Secondary | ICD-10-CM | POA: Insufficient documentation

## 2015-03-04 DIAGNOSIS — Z8719 Personal history of other diseases of the digestive system: Secondary | ICD-10-CM | POA: Diagnosis not present

## 2015-03-04 DIAGNOSIS — J441 Chronic obstructive pulmonary disease with (acute) exacerbation: Secondary | ICD-10-CM | POA: Diagnosis not present

## 2015-03-04 DIAGNOSIS — W540XXA Bitten by dog, initial encounter: Secondary | ICD-10-CM | POA: Insufficient documentation

## 2015-03-04 DIAGNOSIS — S60511A Abrasion of right hand, initial encounter: Secondary | ICD-10-CM | POA: Insufficient documentation

## 2015-03-04 DIAGNOSIS — Z87891 Personal history of nicotine dependence: Secondary | ICD-10-CM | POA: Diagnosis not present

## 2015-03-04 DIAGNOSIS — Z79899 Other long term (current) drug therapy: Secondary | ICD-10-CM | POA: Diagnosis not present

## 2015-03-04 DIAGNOSIS — Z86010 Personal history of colonic polyps: Secondary | ICD-10-CM | POA: Insufficient documentation

## 2015-03-04 DIAGNOSIS — S61401A Unspecified open wound of right hand, initial encounter: Secondary | ICD-10-CM | POA: Diagnosis not present

## 2015-03-04 DIAGNOSIS — Y9289 Other specified places as the place of occurrence of the external cause: Secondary | ICD-10-CM | POA: Insufficient documentation

## 2015-03-04 DIAGNOSIS — Y998 Other external cause status: Secondary | ICD-10-CM | POA: Diagnosis not present

## 2015-03-04 DIAGNOSIS — I10 Essential (primary) hypertension: Secondary | ICD-10-CM | POA: Insufficient documentation

## 2015-03-04 DIAGNOSIS — S6991XA Unspecified injury of right wrist, hand and finger(s), initial encounter: Secondary | ICD-10-CM | POA: Diagnosis present

## 2015-03-04 NOTE — ED Notes (Signed)
Patient arrives POV with c/o right hand injury. Patient hand dog bite last Tuesday that resulted in a scratch per patient. Today patient was cleaning wound and opened the wound again. Wound opened up. Bleeding controlled on arrival. Large clots noted.

## 2015-03-04 NOTE — ED Provider Notes (Signed)
CSN: [id number]     Arrival date & time 03/04/15  4166 History   First MD Initiated Contact with Patient 03/04/15 [phone]     Chief Complaint  Patient presents with  . Hand Injury     (Consider location/radiation/quality/duration/timing/severity/associated sxs/prior Treatment) HPI  This is a 80 year old male who presents with a right hand injury. Patient reports that he was bitten by a dog one week ago. He saw his primary care physician and was given tetanus and antibiotics. He had a scabbed wound over his the dorsum of his right hand. Today he was washing his hands vigorously and the wound opened up. It had a moderate amount of bleeding and he was concerned. He is currently on aspirin. No other anticoagulants. Denies any other injury to the hand.   Past Medical History  Diagnosis Date  . Colon polyps   . Asthma   . Hyperlipidemia   . Hypertension   . Enlarged prostate     with elevated PSA  . Prediabetes 2011  . Diverticulosis   . COPD (chronic obstructive pulmonary disease)    Past Surgical History  Procedure Laterality Date  . Prostate biopsy    . Colonoscopy  03/21/2007    Dr. Jonny Ruiz diverticula, normal rectum  . Colonoscopy  05/04/2012    Procedure: COLONOSCOPY;  Surgeon: Daneil Dolin, MD;  Location: [facility];  Service: Endoscopy;  Laterality: N/A;  7:30   Family History  Problem Relation Age of Onset  . Heart attack Father    History  Substance Use Topics  . Smoking status: Former Research scientist (life sciences)  . Smokeless tobacco: Not on file  . Alcohol Use: No    Review of Systems  Constitutional: Negative for fever.  Skin: Positive for wound. Negative for color change.  All other systems reviewed and are negative.     Allergies  Review of patient's allergies indicates no known allergies.  Home Medications   Prior to Admission medications   Medication Sig Start Date End Date Taking? Authorizing Provider  albuterol (PROVENTIL) (2.5 MG/3ML) 0.083% nebulizer  solution USE ONE VIAL IN NEBULIZER TWO TO THREE TIMES A DAY. 10/04/14   Fayrene Helper, MD  amLODipine (NORVASC) 10 MG tablet TAKE 1 TABLET DAILY FOR BLOOD PRESSURE. 02/13/15   Fayrene Helper, MD  aspirin EC 81 MG tablet Take 81 mg by mouth daily.    Historical Provider, MD  hydrochlorothiazide (HYDRODIURIL) 25 MG tablet TAKE ONE TABLET DAILY. 07/25/14   Fayrene Helper, MD  ipratropium (ATROVENT) 0.02 % nebulizer solution USE ONE VIAL IN NEBULIZER TWO TO THREE TIMES A DAY. 10/04/14   Fayrene Helper, MD  lovastatin (MEVACOR) 40 MG tablet TAKE (1) TABLET BY MOUTH AT BEDTIME. 09/20/14   Fayrene Helper, MD  predniSONE (DELTASONE) 5 MG tablet TAKE ONE TABLET BY MOUTH ONCE DAILY. 02/13/15   Fayrene Helper, MD  PROAIR HFA 108 (90 BASE) MCG/ACT inhaler INHALE 2 PUFFS BY MOUTH EVERY 6 TO 8 HOURS AS NEEDED FOR WHEEZING. 10/21/14   Fayrene Helper, MD  ranitidine (ZANTAC) 150 MG tablet TAKE 1 TABLET BY MOUTH ONCE DAILY. 02/13/15   Fayrene Helper, MD  SYMBICORT 160-4.5 MCG/ACT inhaler INHALE 2 PUFFS BY MOUTH TWICE DAILY. 09/13/14   Fayrene Helper, MD  Vitamin D, Ergocalciferol, (DRISDOL) 50000 UNITS CAPS capsule Take 1 capsule (50,000 Units total) by mouth every 7 (seven) days. 10/30/14   Fayrene Helper, MD   BP 162/85 mmHg  Pulse 109  Temp(Src)  97.9 F (36.6 C) (Oral)  Resp 25  Wt 195 lb (88.451 kg)  SpO2 93% Physical Exam  Constitutional: He is oriented to person, place, and time.  Elderly, chronically ill-appearing, oxygen in place  HENT:  Head: Normocephalic and atraumatic.  Cardiovascular: Normal rate, regular rhythm and normal heart sounds.   No murmur heard. Pulmonary/Chest: Effort normal. No respiratory distress. He has wheezes.  Decreased air movement in all lung fields, 2 L nasal cannula in place  Musculoskeletal: He exhibits no edema.  There is a crescent shaped 2 cm abrasions/skin tear over the dorsum of the right hand, bleeding controlled, surrounding  ecchymosis. No obvious deformity. Neurovascularly intact. 2+ radial pulse  Neurological: He is alert and oriented to person, place, and time.  Skin: Skin is warm and dry.  See musculoskeletal above  Psychiatric: He has a normal mood and affect.  Nursing note and vitals reviewed.   ED Course  Procedures (including critical care time) Labs Review Labs Reviewed - No data to display  Imaging Review No results found.   EKG Interpretation None      MDM   Final diagnoses:  Hand abrasion, right, initial encounter    Patient presents with wound to the right hand. Remote dog bite with subsequent opening of the wound this morning. Patient is nontoxic. No obvious bony injury. Bleeding is controlled. Not a candidate for repair given that this was a remote injury that reopened. Wound will be cleaned and dressed. Patient declined x-ray. He is on 2 L of home oxygen at his baseline.  After history, exam, and medical workup I feel the patient has been appropriately medically screened and is safe for discharge home. Pertinent diagnoses were discussed with the patient. Patient was given return precautions.     Merryl Hacker, MD 03/04/15 [phone]

## 2015-03-04 NOTE — Discharge Instructions (Signed)
You were seen today for an abrasion wound. You need to keep the wound clean. Avoid vigorous scrubbing. You can use bacitracin ointment for wound care. Continue with a compressive dressing until scabbing appears.  Wound Care Wound care helps prevent pain and infection.  You may need a tetanus shot if:  You cannot remember when you had your last tetanus shot.  You have never had a tetanus shot.  The injury broke your skin. If you need a tetanus shot and you choose not to have one, you may get tetanus. Sickness from tetanus can be serious. HOME CARE   Only take medicine as told by your doctor.  Clean the wound daily with mild soap and water.  Change any bandages (dressings) as told by your doctor.  Put medicated cream and a bandage on the wound as told by your doctor.  Change the bandage if it gets wet, dirty, or starts to smell.  Take showers. Do not take baths, swim, or do anything that puts your wound under water.  Rest and raise (elevate) the wound until the pain and puffiness (swelling) are better.  Keep all doctor visits as told. GET HELP RIGHT AWAY IF:   Yellowish-white fluid (pus) comes from the wound.  Medicine does not lessen your pain.  There is a red streak going away from the wound.  You have a fever. MAKE SURE YOU:   Understand these instructions.  Will watch your condition.  Will get help right away if you are not doing well or get worse. Document Released: 08/17/2008 Document Revised: 01/31/2012 Document Reviewed: 03/14/2011 [facility] Patient Information 2015 [place], [place]. This information is not intended to replace advice given to you by your health care provider. Make sure you discuss any questions you have with your health care provider.

## 2015-03-17 ENCOUNTER — Other Ambulatory Visit: Payer: Self-pay | Admitting: Family Medicine

## 2015-03-26 ENCOUNTER — Encounter: Payer: Self-pay | Admitting: Family Medicine

## 2015-03-26 ENCOUNTER — Ambulatory Visit (INDEPENDENT_AMBULATORY_CARE_PROVIDER_SITE_OTHER): Payer: Medicare HMO | Admitting: Family Medicine

## 2015-03-26 VITALS — BP 120/72 | HR 82 | Resp 16 | Ht 71.0 in | Wt 184.0 lb

## 2015-03-26 DIAGNOSIS — Z1211 Encounter for screening for malignant neoplasm of colon: Secondary | ICD-10-CM

## 2015-03-26 DIAGNOSIS — R7302 Impaired glucose tolerance (oral): Secondary | ICD-10-CM

## 2015-03-26 DIAGNOSIS — E785 Hyperlipidemia, unspecified: Secondary | ICD-10-CM

## 2015-03-26 DIAGNOSIS — Z0000 Encounter for general adult medical examination without abnormal findings: Secondary | ICD-10-CM | POA: Diagnosis not present

## 2015-03-26 DIAGNOSIS — J438 Other emphysema: Secondary | ICD-10-CM

## 2015-03-26 DIAGNOSIS — R972 Elevated prostate specific antigen [PSA]: Secondary | ICD-10-CM

## 2015-03-26 DIAGNOSIS — I10 Essential (primary) hypertension: Secondary | ICD-10-CM

## 2015-03-26 LAB — HEMOCCULT GUIAC POC 1CARD (OFFICE): FECAL OCCULT BLD: NEGATIVE

## 2015-03-26 MED ORDER — ALBUTEROL SULFATE (2.5 MG/3ML) 0.083% IN NEBU: INHALATION_SOLUTION | RESPIRATORY_TRACT | Status: DC

## 2015-03-26 MED ORDER — LOVASTATIN 40 MG PO TABS: ORAL_TABLET | ORAL | Status: DC

## 2015-03-26 MED ORDER — PREDNISONE 5 MG PO TABS: 5.0000 mg | ORAL_TABLET | Freq: Every day | ORAL | Status: DC

## 2015-03-26 MED ORDER — IPRATROPIUM BROMIDE 0.02 % IN SOLN: RESPIRATORY_TRACT | Status: DC

## 2015-03-26 MED ORDER — ALBUTEROL SULFATE HFA 108 (90 BASE) MCG/ACT IN AERS: INHALATION_SPRAY | RESPIRATORY_TRACT | Status: DC

## 2015-03-26 MED ORDER — BUDESONIDE-FORMOTEROL FUMARATE 160-4.5 MCG/ACT IN AERO: INHALATION_SPRAY | RESPIRATORY_TRACT | Status: DC

## 2015-03-26 MED ORDER — HYDROCHLOROTHIAZIDE 25 MG PO TABS: ORAL_TABLET | ORAL | Status: DC

## 2015-03-26 MED ORDER — RANITIDINE HCL 150 MG PO TABS: 150.0000 mg | ORAL_TABLET | Freq: Every day | ORAL | Status: DC

## 2015-03-26 NOTE — Assessment & Plan Note (Signed)
Annual exam as documented. 

## 2015-03-26 NOTE — Patient Instructions (Signed)
Annual wellness in 4. month, call if you need me before  You are referred for re evaluation for night oxygen .  You need oxygen 24 hours per day  You are referred to urology here in [place] , they will call  STOP amlodipine  Nurse BP check in 2 weeks  Need once daily vit D3 800 IU , this is OTC, will help bones  CBC, fasting lipid, cmp , TSH, hBA1C in next 1 week please

## 2015-03-30 NOTE — Assessment & Plan Note (Signed)
Will refer to [place] urologist for ongoing management, his biopsies have all been negative though his PSA has been  Elevated for yours

## 2015-03-30 NOTE — Assessment & Plan Note (Signed)
May be over corrected, as pt has been off amlodipine for several days and at visit, he is normotensive, he is to d/c amlodipine and return in 2 weeks for nurse BP check

## 2015-03-30 NOTE — Progress Notes (Signed)
   Subjective:    Patient ID: Kyle Mosley, male    DOB: [birth year], 80 y.o.   MRN: [id number]  HPI Patient is in for annual physical exam. He does c/o increased SOB with activity, continuous oxygen has been recommended in the past, he has and still continues to refuse, but is willing to get retested for nocturnal oxygen. Denies any recent fever, chilsl or sputum production Immunization is reviewed , and  updated if needed.    Review of Systems See HPI     Objective:   Physical Exam BP 120/72 mmHg  Pulse 82  Resp 16  Ht 5\' 11"  (1.803 m)  Wt 184 lb (83.462 kg)  BMI 25.67 kg/m2  SpO2 87%   . Pleasant well nourished male, alert and oriented x 3,moderate pulmonary distress with minimal activity Afebrile. HEENT No facial trauma or asymetry. Sinuses non tender. EOMI, PERTL External ears normal, tympanic membranes clear. Oropharynx moist, no exudate,poor  dentition. Neck: adequate though reduced ROM, no adenopathy,JVD or thyromegaly.No bruits.  Chest: Markedly reduced air entry throughout, no crackles , few wheezes Non tender to palpation  Breast: No asymetry,no masses. No nipple discharge or inversion. No axillary or supraclavicular adenopathy  Cardiovascular system; Heart sounds normal,  S1 and  S2 ,no S3.  No murmur, or thrill. Apical beat not displaced Peripheral pulses normal.  Abdomen: Soft, non tender, no organomegaly or masses. No bruits. Bowel sounds normal. No guarding, tenderness or rebound.  Rectal:  Normal sphincter tone. No hemorrhoids or  masses. guaiac negative stool.   GU: Not examined, will be referred to urology re elevated PSa.  Musculoskeletal exam: Decreased ROM of spine, hips , shoulders and knees.  Neurologic: Cranial nerves 2 to 12 intact. Power, tone ,sensation and reflexes normal throughout.   Skin: Healing area with scab on right dorsum where he was recently bitten by a dog. Multiple visible bruises under the  scan. Pigmentation normal throughout  Psych; Normal mood and affect. Judgement and concentration normal         Assessment & Plan:  Annual physical exam Annual exam as documented.    Essential hypertension May be over corrected, as pt has been off amlodipine for several days and at visit, he is normotensive, he is to d/c amlodipine and return in 2 weeks for nurse BP check   COPD (chronic obstructive pulmonary disease) Marked deterioration, now agreeing to oxygen n nocturnal supplementation, needs 24 hour oxygen still resists. Continue current meds   Elevated PSA Will refer to [place] urologist for ongoing management, his biopsies have all been negative though his PSA has been  Elevated for yours

## 2015-03-30 NOTE — Assessment & Plan Note (Signed)
Marked deterioration, now agreeing to oxygen n nocturnal supplementation, needs 24 hour oxygen still resists. Continue current meds

## 2015-04-03 LAB — COMPREHENSIVE METABOLIC PANEL
ALT: 12 U/L (ref 0–53)
AST: 18 U/L (ref 0–37)
Albumin: 2.9 g/dL — ABNORMAL LOW (ref 3.5–5.2)
Alkaline Phosphatase: 89 U/L (ref 39–117)
BUN: 18 mg/dL (ref 6–23)
CALCIUM: 8.7 mg/dL (ref 8.4–10.5)
CHLORIDE: 100 meq/L (ref 96–112)
CO2: 32 mEq/L (ref 19–32)
CREATININE: 0.77 mg/dL (ref 0.50–1.35)
Glucose, Bld: 85 mg/dL (ref 70–99)
Potassium: 4.4 mEq/L (ref 3.5–5.3)
SODIUM: 140 meq/L (ref 135–145)
Total Bilirubin: 0.6 mg/dL (ref 0.2–1.2)
Total Protein: 6.4 g/dL (ref 6.0–8.3)

## 2015-04-03 LAB — CBC WITH DIFFERENTIAL/PLATELET
Basophils Absolute: 0 10*3/uL (ref 0.0–0.1)
Basophils Relative: 0 % (ref 0–1)
Eosinophils Absolute: 0.4 10*3/uL (ref 0.0–0.7)
Eosinophils Relative: 5 % (ref 0–5)
HEMATOCRIT: 42.9 % (ref 39.0–52.0)
Hemoglobin: 13.8 g/dL (ref 13.0–17.0)
LYMPHS PCT: 13 % (ref 12–46)
Lymphs Abs: 1 10*3/uL (ref 0.7–4.0)
MCH: 28 pg (ref 26.0–34.0)
MCHC: 32.2 g/dL (ref 30.0–36.0)
MCV: 87 fL (ref 78.0–100.0)
MONOS PCT: 8 % (ref 3–12)
MPV: 10.1 fL (ref 8.6–12.4)
Monocytes Absolute: 0.6 10*3/uL (ref 0.1–1.0)
NEUTROS ABS: 5.9 10*3/uL (ref 1.7–7.7)
Neutrophils Relative %: 74 % (ref 43–77)
PLATELETS: 320 10*3/uL (ref 150–400)
RBC: 4.93 MIL/uL (ref 4.22–5.81)
RDW: 15.1 % (ref 11.5–15.5)
WBC: 8 10*3/uL (ref 4.0–10.5)

## 2015-04-03 LAB — LIPID PANEL
CHOL/HDL RATIO: 1.5 ratio
Cholesterol: 117 mg/dL (ref 0–200)
HDL: 78 mg/dL (ref >=40)
LDL Cholesterol: 32 mg/dL (ref 0–99)
TRIGLYCERIDES: 34 mg/dL (ref <150)
VLDL: 7 mg/dL (ref 0–40)

## 2015-04-03 LAB — HEMOGLOBIN A1C
Hgb A1c MFr Bld: 5.8 % — ABNORMAL HIGH (ref <5.7)
MEAN PLASMA GLUCOSE: 120 mg/dL — AB (ref <117)

## 2015-04-03 LAB — TSH: TSH: 2.083 u[IU]/mL (ref 0.350–4.500)

## 2015-04-09 ENCOUNTER — Ambulatory Visit: Payer: Medicare HMO

## 2015-04-09 VITALS — BP 130/72

## 2015-04-09 DIAGNOSIS — I10 Essential (primary) hypertension: Secondary | ICD-10-CM

## 2015-04-09 NOTE — Progress Notes (Signed)
Patient in for blood pressure eval after Amlodipine stopped after last visit.  Blood pressure normal and patient will continue meds as ordered.

## 2015-04-15 ENCOUNTER — Telehealth: Payer: Self-pay | Admitting: Family Medicine

## 2015-04-15 NOTE — Telephone Encounter (Signed)
Verbal given for OCD titration

## 2015-05-09 ENCOUNTER — Ambulatory Visit (INDEPENDENT_AMBULATORY_CARE_PROVIDER_SITE_OTHER): Payer: Medicare HMO | Admitting: Urology

## 2015-05-09 DIAGNOSIS — R3912 Poor urinary stream: Secondary | ICD-10-CM | POA: Diagnosis not present

## 2015-05-09 DIAGNOSIS — N401 Enlarged prostate with lower urinary tract symptoms: Secondary | ICD-10-CM | POA: Diagnosis not present

## 2015-05-09 DIAGNOSIS — R972 Elevated prostate specific antigen [PSA]: Secondary | ICD-10-CM

## 2015-09-09 ENCOUNTER — Other Ambulatory Visit: Payer: Self-pay | Admitting: Family Medicine

## 2015-09-10 ENCOUNTER — Encounter: Payer: Self-pay | Admitting: Family Medicine

## 2015-09-10 ENCOUNTER — Ambulatory Visit (INDEPENDENT_AMBULATORY_CARE_PROVIDER_SITE_OTHER): Payer: Medicare HMO | Admitting: Family Medicine

## 2015-09-10 VITALS — BP 130/80 | HR 75 | Resp 16 | Ht 71.0 in | Wt 179.0 lb

## 2015-09-10 DIAGNOSIS — R7302 Impaired glucose tolerance (oral): Secondary | ICD-10-CM

## 2015-09-10 DIAGNOSIS — Z0000 Encounter for general adult medical examination without abnormal findings: Secondary | ICD-10-CM

## 2015-09-10 DIAGNOSIS — I10 Essential (primary) hypertension: Secondary | ICD-10-CM

## 2015-09-10 DIAGNOSIS — E785 Hyperlipidemia, unspecified: Secondary | ICD-10-CM

## 2015-09-10 DIAGNOSIS — Z23 Encounter for immunization: Secondary | ICD-10-CM

## 2015-09-10 DIAGNOSIS — J449 Chronic obstructive pulmonary disease, unspecified: Secondary | ICD-10-CM

## 2015-09-10 MED ORDER — HYDROCHLOROTHIAZIDE 25 MG PO TABS: 25.0000 mg | ORAL_TABLET | Freq: Every day | ORAL | Status: DC

## 2015-09-10 NOTE — Assessment & Plan Note (Signed)
Annual exam as documented. Counseling done  re healthy lifestyle involving commitment to  Regular  exercise as able, diet rich in vegetable and fruit, reduce sweets, carbs and work on weight loss Regular seat belt use and home safety, is also discussed. Changes in health habits are decided on by the patient with goals and time frames  set for achieving them. Immunization and cancer screening needs are specifically addressed at this visit.

## 2015-09-10 NOTE — Progress Notes (Signed)
Subjective:    Patient ID: Kyle Mosley, male    DOB: 1935-07-15, 80 y.o.   MRN: [id number]  HPI Preventive Screening-Counseling & Management   Patient present here today for a Medicare annual wellness visit.   Current Problems (verified)   Medications Prior to Visit Allergies (verified)   PAST HISTORY  Family History (verfied)   Social History  Married 100 years, 3 boys, retired Former smoker, retired from [employer], educational    Risk Factors  Current exercise habits:  Very little due to breathing problems   Dietary issues discussed: low fat, low carb, heart healthy    Cardiac risk factors:  Father had MI at age 32   Depression Screen  (Note: if answer to either of the following is "Yes", a more complete depression screening is indicated)   Over the past two weeks, have you felt down, depressed or hopeless? No  Over the past two weeks, have you felt little interest or pleasure in doing things? No  Have you lost interest or pleasure in daily life? No  Do you often feel hopeless? No  Do you cry easily over simple problems? No   Activities of Daily Living  In your present state of health, do you have any difficulty performing the following activities?  Driving?: no  Managing money?: No Feeding yourself?:No Getting from bed to chair?:No Climbing a flight of stairs?:yes, has to take his time  Preparing food and eating?:No Bathing or showering?:No Getting dressed?:No Getting to the toilet?:No Using the toilet?:No Moving around from place to place?: yes due to poor lung function, uses portable oxygen  Fall Risk Assessment In the past year have you fallen or had a near fall?:yes 1 fall, no injury. [place] out of bed while dreaming  Are you currently taking any medications that make you dizzy?:No   Hearing Difficulties: No Do you often ask people to speak up or repeat themselves?:No Do you experience ringing or noises in your ears?:No Do you have difficulty understanding  soft or whispered voices?yes at times  Cognitive Testing  Alert? Yes Normal Appearance?Yes  Oriented to person? Yes Place? Yes  Time? Yes  Displays appropriate judgment?Yes  Can read the correct time from a watch face? yes Are you having problems remembering things? No   Advanced Directives have been discussed with the patient?Yes , full code  List the Names of Other Physician/Practitioners you currently use:,  Dr Jeffie Pollock (urology)  Dr Jorja Loa (opth)    Indicate any recent Medical Services you may have received from other than [facility] providers in the past year (date may be approximate).   Assessment:    Annual Wellness Exam   Plan:     Medicare Attestation  I have personally reviewed:  The patient's medical and social history  Their use of alcohol, tobacco or illicit drugs  Their current medications and supplements  The patient's functional ability including ADLs,fall risks, home safety risks, cognitive, and hearing and visual impairment  Diet and physical activities  Evidence for depression or mood disorders  The patient's weight, height, BMI, and visual acuity have been recorded in the chart. I have made referrals, counseling, and provided education to the patient based on review of the above and I have provided the patient with a written personalized care plan for preventive services.      Review of Systems     Objective:   Physical Exam  BP 130/80 mmHg  Pulse 75  Resp 16  Ht 5\' 11"  (1.803 m)  Wt 179 lb (81.194 kg)  BMI 24.98 kg/m2  SpO2 91%       Assessment & Plan:  Medicare annual wellness visit, subsequent Annual exam as documented. Counseling done  re healthy lifestyle involving commitment to  Regular  exercise as able, diet rich in vegetable and fruit, reduce sweets, carbs and work on weight loss Regular seat belt use and home safety, is also discussed. Changes in health habits are decided on by the patient with goals and time frames  set for achieving  them. Immunization and cancer screening needs are specifically addressed at this visit.

## 2015-09-10 NOTE — Patient Instructions (Addendum)
F/u early March, call if you need me before  Flu vaccine today  Please maintain current weight, eat regualrly and often  Fasting lipid, cmp, hBA1C end Februaury  All the best  Thanks for choosing [facility], we consider it a privelige to serve you.

## 2015-10-07 ENCOUNTER — Telehealth: Payer: Self-pay | Admitting: Family Medicine

## 2015-10-07 ENCOUNTER — Other Ambulatory Visit: Payer: Self-pay

## 2015-10-07 MED ORDER — ALBUTEROL SULFATE HFA 108 (90 BASE) MCG/ACT IN AERS: INHALATION_SPRAY | RESPIRATORY_TRACT | Status: DC

## 2015-10-07 MED ORDER — BUDESONIDE-FORMOTEROL FUMARATE 160-4.5 MCG/ACT IN AERO: INHALATION_SPRAY | RESPIRATORY_TRACT | Status: DC

## 2015-10-07 NOTE — Telephone Encounter (Signed)
Patient is stating that he is having problems getting his medications from [place] due to insurance, he is asking for a returned call

## 2015-10-07 NOTE — Telephone Encounter (Signed)
Patient's insurance no longer is in network with [facility].   He would like his medications sent to [facility].  He needs Dynegy and Symbicort sent today and will call as he needs other medications.

## 2015-10-21 ENCOUNTER — Other Ambulatory Visit: Payer: Self-pay

## 2015-10-21 MED ORDER — IPRATROPIUM BROMIDE 0.02 % IN SOLN: RESPIRATORY_TRACT | Status: DC

## 2015-10-21 MED ORDER — ALBUTEROL SULFATE (2.5 MG/3ML) 0.083% IN NEBU: INHALATION_SOLUTION | RESPIRATORY_TRACT | Status: DC

## 2015-10-21 MED ORDER — HYDROCHLOROTHIAZIDE 25 MG PO TABS: 25.0000 mg | ORAL_TABLET | Freq: Every day | ORAL | Status: DC

## 2015-10-21 MED ORDER — RANITIDINE HCL 150 MG PO TABS: 150.0000 mg | ORAL_TABLET | Freq: Every day | ORAL | Status: DC

## 2015-10-21 MED ORDER — PREDNISONE 5 MG PO TABS: 5.0000 mg | ORAL_TABLET | Freq: Every day | ORAL | Status: DC

## 2015-10-21 MED ORDER — LOVASTATIN 40 MG PO TABS: ORAL_TABLET | ORAL | Status: DC

## 2015-11-25 ENCOUNTER — Other Ambulatory Visit: Payer: Self-pay

## 2015-11-25 MED ORDER — FLUTICASONE-SALMETEROL 500-50 MCG/DOSE IN AEPB: 1.0000 | INHALATION_SPRAY | Freq: Two times a day (BID) | RESPIRATORY_TRACT | Status: DC

## 2015-12-23 ENCOUNTER — Other Ambulatory Visit: Payer: Self-pay

## 2015-12-23 MED ORDER — HYDROCHLOROTHIAZIDE 25 MG PO TABS: 25.0000 mg | ORAL_TABLET | Freq: Every day | ORAL | Status: DC

## 2016-01-22 ENCOUNTER — Encounter: Payer: Self-pay | Admitting: Family Medicine

## 2016-01-22 ENCOUNTER — Ambulatory Visit (INDEPENDENT_AMBULATORY_CARE_PROVIDER_SITE_OTHER): Payer: Medicare HMO | Admitting: Family Medicine

## 2016-01-22 VITALS — BP 130/80 | HR 83 | Resp 16 | Ht 71.0 in | Wt 178.0 lb

## 2016-01-22 DIAGNOSIS — R972 Elevated prostate specific antigen [PSA]: Secondary | ICD-10-CM

## 2016-01-22 DIAGNOSIS — I10 Essential (primary) hypertension: Secondary | ICD-10-CM

## 2016-01-22 DIAGNOSIS — J4540 Moderate persistent asthma, uncomplicated: Secondary | ICD-10-CM

## 2016-01-22 DIAGNOSIS — E785 Hyperlipidemia, unspecified: Secondary | ICD-10-CM | POA: Diagnosis not present

## 2016-01-22 DIAGNOSIS — F329 Major depressive disorder, single episode, unspecified: Secondary | ICD-10-CM

## 2016-01-22 DIAGNOSIS — J449 Chronic obstructive pulmonary disease, unspecified: Secondary | ICD-10-CM | POA: Diagnosis not present

## 2016-01-22 DIAGNOSIS — R7302 Impaired glucose tolerance (oral): Secondary | ICD-10-CM

## 2016-01-22 DIAGNOSIS — E559 Vitamin D deficiency, unspecified: Secondary | ICD-10-CM

## 2016-01-22 DIAGNOSIS — F32A Depression, unspecified: Secondary | ICD-10-CM | POA: Insufficient documentation

## 2016-01-22 MED ORDER — MIRTAZAPINE 7.5 MG PO TABS: 7.5000 mg | ORAL_TABLET | Freq: Every day | ORAL | Status: DC

## 2016-01-22 MED ORDER — PREDNISONE 5 MG (21) PO TBPK: 5.0000 mg | ORAL_TABLET | ORAL | Status: DC

## 2016-01-22 MED ORDER — ALBUTEROL SULFATE HFA 108 (90 BASE) MCG/ACT IN AERS: INHALATION_SPRAY | RESPIRATORY_TRACT | Status: DC

## 2016-01-22 NOTE — Assessment & Plan Note (Signed)
Controlled, no change in medication  

## 2016-01-22 NOTE — Progress Notes (Signed)
Subjective:    Patient ID: Kyle Mosley, male    DOB: March 03, 1935, 81 y.o.   MRN: [id number]  HPI    Kyle Mosley     MRN: [id number]      DOB: [birth year]   HPI Mr. Ziemba is here for follow up and re-evaluation of chronic medical conditions, medication management and review of any available recent lab and radiology data.  Preventive health is updated, specifically  Cancer screening and Immunization.   Questions or concerns regarding consultations or procedures which the PT has had in the interim are  Addressed.Has been signed off of urology The PT denies any adverse reactions to current medications since the last visit.  Notes increased withdrawal and less interest in doing things, poor sleep and poor appetite  ROS Denies recent fever or chills. Denies sinus pressure, nasal congestion, ear pain or sore throat. Denies chest congestion, productive cough or wheezing.Chrionic SOB requires continual supplemental oxygen Denies chest pains, palpitations and leg swelling Denies abdominal pain, nausea, vomiting,diarrhea or constipation.   Denies dysuria, frequency, hesitancy or incontinence. Denies uncontrolled  joint pain, swelling and limitation in mobility. Denies headaches, seizures, numbness, or tingling. . Denies skin break down or rash.   PE  BP 130/80 mmHg  Pulse 83  Resp 16  Ht 5\' 11"  (1.803 m)  Wt 178 lb (80.74 kg)  BMI 24.84 kg/m2  SpO2 91%  Patient alert and oriented and in mild  cardiopulmonary distress.Requires supplemental oxygen continually and complies  HEENT: No facial asymmetry, EOMI,   oropharynx pink and moist.  Neck supple no JVD, no mass.  Chest: Clear to auscultation bilaterally.  CVS: S1, S2 no murmurs, no S3.Regular rate.  ABD: Soft non tender.   Ext: No edema  MS: Adequate ROM spine, shoulders, hips and knees.  Skin: Intact, no ulcerations or rash noted.  Psych: Good eye contact, normal affect. Memory intact not anxious or depressed  appearing.  CNS: CN 2-12 intact, power,  normal throughout.no focal deficits noted.   Assessment & Plan   Depression Not suicidal or homicidal, poor sleep and poor appetite, trial of low dose Remeron   Essential hypertension Controlled, no change in medication DASH diet and commitment to daily physical activity for a minimum of 30 minutes discussed and encouraged, as a part of hypertension management. The importance of attaining a healthy weight is also discussed.  BP/Weight 01/22/2016 09/10/2015 04/09/2015 03/26/2015 03/04/2015 [id number] [id number]  Systolic BP [id number] AB-[id number] 123456 0000000 [id number]  Diastolic BP 80 80 72 72 85 74 82  Wt. (Lbs) 178 179 - 184 195 195 197.12  BMI 24.84 24.98 - 25.67 26.44 26.44 26.73        COPD (chronic obstructive pulmonary disease) (HCC) Recent flare on 2 occasions in past 4 weeks On avg uses neb treatment twice daily, had to increase to 4 times daily With season change will send in short course of prednisone burst in the event that he needs this between visits, also advised that he may call to come in for deopomedrol IM  Bronchial asthma Controlled, no change in medication   IGT (impaired glucose tolerance) Patient educated about the importance of limiting  Carbohydrate intake , the need to commit to daily physical activity for a minimum of 30 minutes , and to commit weight loss. The fact that changes in all these areas will reduce or eliminate all together the development of diabetes is stressed.   Diabetic Labs Latest Ref Rng 04/02/2015 10/22/2014 02/21/2014  08/28/2013 02/20/2013  HbA1c <5.7 % 5.8(H) 5.7(H) - 5.9(H) -  Chol 0 - 200 mg/dL 117 135 141 150 150  HDL >=40 mg/dL 78 63 65 58 64  Calc LDL 0 - 99 mg/dL 32 64 68 82 78  Triglycerides <150 mg/dL 34 40 38 51 39  Creatinine 0.50 - 1.35 mg/dL 0.77 0.96 0.87 0.84 0.89   BP/Weight 01/22/2016 09/10/2015 04/09/2015 03/26/2015 03/04/2015 [id number] [id number]  Systolic BP [id number] [id number] AB-[id number] 123456 0000000 [id number]  Diastolic BP 80  80 72 72 85 74 82  Wt. (Lbs) 178 179 - 184 195 195 197.12  BMI 24.84 24.98 - 25.67 26.44 26.44 26.73   No flowsheet data found.   Updated lab needed at/ before next visit.   Elevated PSA No furhter evaluation per urology recommendation, which based on age and overall health is appropriate  Hyperlipidemia LDL goal <100 Hyperlipidemia:Low fat diet discussed and encouraged.   Lipid Panel  Lab Results  Component Value Date   CHOL 117 04/02/2015   HDL 78 04/02/2015   LDLCALC 32 04/02/2015   TRIG 34 04/02/2015   CHOLHDL 1.5 04/02/2015   Updated lab needed at/ before next visit.          Review of Systems     Objective:   Physical Exam        Assessment & Plan:

## 2016-01-22 NOTE — Assessment & Plan Note (Signed)
Controlled, no change in medication DASH diet and commitment to daily physical activity for a minimum of 30 minutes discussed and encouraged, as a part of hypertension management. The importance of attaining a healthy weight is also discussed.  BP/Weight 01/22/2016 09/10/2015 04/09/2015 03/26/2015 03/04/2015 99991111 [id number]  Systolic BP [id number] AB-123456789 AB-123456789 123456 0000000 [id number] [id number]  Diastolic BP 80 80 72 72 85 74 82  Wt. (Lbs) 178 179 - 184 195 195 197.12  BMI 24.84 24.98 - 25.67 26.44 26.44 26.73

## 2016-01-22 NOTE — Patient Instructions (Addendum)
Annual physical exam May 9  or after , call if you need me sooner  Fasting lipid, cmp , HBA1C, TSH and vit D May 5 or after  New for sleep , appetite and mood is remron to be taken at bedtime  Short course of prednisone sent in to be used if t you develop increased difficulty breathing, you may alos call in for injection to help with breathing if you need it  Thanks for choosing [facility], we consider it a privelige to serve you.

## 2016-01-22 NOTE — Assessment & Plan Note (Signed)
No furhter evaluation per urology recommendation, which based on age and overall health is appropriate

## 2016-01-22 NOTE — Assessment & Plan Note (Signed)
Recent flare on 2 occasions in past 4 weeks On avg uses neb treatment twice daily, had to increase to 4 times daily With season change will send in short course of prednisone burst in the event that he needs this between visits, also advised that he may call to come in for deopomedrol IM

## 2016-01-22 NOTE — Assessment & Plan Note (Addendum)
Not suicidal or homicidal, poor sleep and poor appetite, trial of low dose Remeron

## 2016-01-22 NOTE — Assessment & Plan Note (Signed)
Patient educated about the importance of limiting  Carbohydrate intake , the need to commit to daily physical activity for a minimum of 30 minutes , and to commit weight loss. The fact that changes in all these areas will reduce or eliminate all together the development of diabetes is stressed.   Diabetic Labs Latest Ref Rng 04/02/2015 10/22/2014 02/21/2014 08/28/2013 02/20/2013  HbA1c <5.7 % 5.8(H) 5.7(H) - 5.9(H) -  Chol 0 - 200 mg/dL 117 135 141 150 150  HDL >=40 mg/dL 78 63 65 58 64  Calc LDL 0 - 99 mg/dL 32 64 68 82 78  Triglycerides <150 mg/dL 34 40 38 51 39  Creatinine 0.50 - 1.35 mg/dL 0.77 0.96 0.87 0.84 0.89   BP/Weight 01/22/2016 09/10/2015 04/09/2015 03/26/2015 03/04/2015 [id number] [id number]  Systolic BP AB-[id number] AB-123456789 AB-123456789 123456 0000000 [id number]  Diastolic BP 80 80 72 72 85 74 82  Wt. (Lbs) 178 179 - 184 195 195 197.12  BMI 24.84 24.98 - 25.67 26.44 26.44 26.73   No flowsheet data found.   Updated lab needed at/ before next visit.

## 2016-01-22 NOTE — Assessment & Plan Note (Signed)
Hyperlipidemia:Low fat diet discussed and encouraged.   Lipid Panel  Lab Results  Component Value Date   CHOL 117 04/02/2015   HDL 78 04/02/2015   LDLCALC 32 04/02/2015   TRIG 34 04/02/2015   CHOLHDL 1.5 04/02/2015   Updated lab needed at/ before next visit.

## 2016-05-13 LAB — HEMOGLOBIN A1C
HEMOGLOBIN A1C: 5.6 % (ref <5.7)
Mean Plasma Glucose: 114 mg/dL

## 2016-05-14 LAB — COMPREHENSIVE METABOLIC PANEL
ALT: 10 U/L (ref 9–46)
AST: 14 U/L (ref 10–35)
Albumin: 3.2 g/dL — ABNORMAL LOW (ref 3.6–5.1)
Alkaline Phosphatase: 71 U/L (ref 40–115)
BUN: 14 mg/dL (ref 7–25)
CHLORIDE: 98 mmol/L (ref 98–110)
CO2: 32 mmol/L — AB (ref 20–31)
CREATININE: 0.78 mg/dL (ref 0.70–1.18)
Calcium: 8.6 mg/dL (ref 8.6–10.3)
Glucose, Bld: 102 mg/dL — ABNORMAL HIGH (ref 65–99)
POTASSIUM: 4 mmol/L (ref 3.5–5.3)
SODIUM: 138 mmol/L (ref 135–146)
TOTAL PROTEIN: 6.3 g/dL (ref 6.1–8.1)
Total Bilirubin: 0.4 mg/dL (ref 0.2–1.2)

## 2016-05-14 LAB — TSH: TSH: 2.14 mIU/L (ref 0.40–4.50)

## 2016-05-14 LAB — LIPID PANEL
CHOL/HDL RATIO: 1.5 ratio (ref <=5.0)
CHOLESTEROL: 126 mg/dL (ref 125–200)
HDL: 82 mg/dL (ref >=40)
LDL CALC: 36 mg/dL (ref <130)
TRIGLYCERIDES: 42 mg/dL (ref <150)
VLDL: 8 mg/dL (ref <30)

## 2016-05-14 LAB — VITAMIN D 25 HYDROXY (VIT D DEFICIENCY, FRACTURES): VIT D 25 HYDROXY: 20 ng/mL — AB (ref 30–100)

## 2016-05-24 ENCOUNTER — Other Ambulatory Visit: Payer: Self-pay | Admitting: Family Medicine

## 2016-05-27 ENCOUNTER — Ambulatory Visit (INDEPENDENT_AMBULATORY_CARE_PROVIDER_SITE_OTHER): Payer: Medicare HMO | Admitting: Family Medicine

## 2016-05-27 ENCOUNTER — Encounter: Payer: Self-pay | Admitting: Family Medicine

## 2016-05-27 ENCOUNTER — Encounter (INDEPENDENT_AMBULATORY_CARE_PROVIDER_SITE_OTHER): Payer: Self-pay

## 2016-05-27 VITALS — BP 132/76 | HR 78 | Resp 18 | Ht 71.0 in | Wt 170.0 lb

## 2016-05-27 DIAGNOSIS — R972 Elevated prostate specific antigen [PSA]: Secondary | ICD-10-CM | POA: Diagnosis not present

## 2016-05-27 DIAGNOSIS — Z0000 Encounter for general adult medical examination without abnormal findings: Secondary | ICD-10-CM | POA: Diagnosis not present

## 2016-05-27 DIAGNOSIS — R634 Abnormal weight loss: Secondary | ICD-10-CM | POA: Diagnosis not present

## 2016-05-27 DIAGNOSIS — Z1211 Encounter for screening for malignant neoplasm of colon: Secondary | ICD-10-CM | POA: Diagnosis not present

## 2016-05-27 DIAGNOSIS — Z125 Encounter for screening for malignant neoplasm of prostate: Secondary | ICD-10-CM

## 2016-05-27 DIAGNOSIS — F329 Major depressive disorder, single episode, unspecified: Secondary | ICD-10-CM | POA: Diagnosis not present

## 2016-05-27 DIAGNOSIS — I10 Essential (primary) hypertension: Secondary | ICD-10-CM | POA: Diagnosis not present

## 2016-05-27 DIAGNOSIS — F32A Depression, unspecified: Secondary | ICD-10-CM

## 2016-05-27 LAB — HEMOCCULT GUIAC POC 1CARD (OFFICE): FECAL OCCULT BLD: NEGATIVE

## 2016-05-27 MED ORDER — ERGOCALCIFEROL 1.25 MG (50000 UT) PO CAPS: 50000.0000 [IU] | ORAL_CAPSULE | ORAL | Status: DC

## 2016-05-27 MED ORDER — MIRTAZAPINE 7.5 MG PO TABS: 7.5000 mg | ORAL_TABLET | Freq: Every day | ORAL | Status: DC

## 2016-05-27 NOTE — Assessment & Plan Note (Signed)
Prostate veery enlarged on exam, last seen in 2016 by urology, no further testing recommended at the time, however, n light of general overall decline in health, will get pSA for next visit and a cBC,in thehope that i will have some better understanding as far as anorexia and weight loss are concerned. Very  Conservative patient and family driven care will be continued

## 2016-05-27 NOTE — Progress Notes (Signed)
   Subjective:    Patient ID: Kyle Mosley, male    DOB: October 24, 1935, 81 y.o.   MRN: [id number]  HPI    Review of Systems     Objective:   Physical Exam        Assessment & Plan:  Annual physical exam Annual exam as documented. Counseling done  re healthy lifestyle involving commitment to 150 minutes exercise per week, heart healthy diet, and attaining healthy weight.The importance of adequate sleep also discussed. Regular seat belt use and home safety, is also discussed. Hospital bed offered , patient declined Immunization and cancer screening needs are specifically addressed at this visit.   Loss of weight Unintentional weight loss , 14 pounds in 1 year, reports early satiety, poor appetite and poor sleep. Not suicidal or homicidal,facing  his own mortality and failing health, does report some anxiety and probable mild depression No interest in imaging studies and testing , trial of Remeron and f/u in 2 month. Encourage pt to work on appetite improvement  Elevated PSA Prostate veery enlarged on exam, last seen in 2016 by urology, no further testing recommended at the time, however, n light of general overall decline in health, will get pSA for next visit and a cBC,in thehope that i will have some better understanding as far as anorexia and weight loss are concerned. Very  Conservative patient and family driven care will be continued

## 2016-05-27 NOTE — Assessment & Plan Note (Signed)
Annual exam as documented. Counseling done  re healthy lifestyle involving commitment to 150 minutes exercise per week, heart healthy diet, and attaining healthy weight.The importance of adequate sleep also discussed. Regular seat belt use and home safety, is also discussed. Hospital bed offered , patient declined Immunization and cancer screening needs are specifically addressed at this visit.

## 2016-05-27 NOTE — Patient Instructions (Addendum)
F/u in 2 months, call if you need me sooner  Start once weekly vitamin D  Start Remeron one at bedtime  Blood work 2 days before next visit PSA and CBC   Blood work in June is excellent , just shows that you need to eat more often and larger quantities of food  Thank you  for choosing [facility]. We consider it a privelige to serve you.  Delivering excellent health care in a caring and  compassionate way is our goal.  Partnering with you,  so that together we can achieve this goal is our strategy.

## 2016-05-27 NOTE — Progress Notes (Signed)
   Kyle Mosley     MRN: [id number]      DOB: [birth year]   HPI: Patient is in for annual physical exam. C/o poor appetite with ongoing weight loss, states he may eat one good meal per day, also does state that he "has a lot on his mind" , knows we all have to leave here. Due to long smoking history , weight loss and early satiety, discussed possibility of chest and abdominal scanning, wants to hold off at this time, denies any pain will resume the Remeron which he ahd been started on. Reports poor sleep. Recent labs, if  are reviewed, and just show low albumin and low vit D, needs CBC and will do PSA based on today's exam , to try to get better understanding of possible cause for weight loss Immunization is reviewed ..    PE; Pleasant male, alert and oriented x 3, in no cardio-pulmonary distress.Bitemporal wasting. Chronically ill appearing Afebrile. HEENT No facial trauma or asymetry. Sinuses non tender. EOMI, pupils equally reactive to light. External ears normal, tympanic membranes clear. Oropharynx moist, no exudate. Neck: supple, no adenopathy,JVD or thyromegaly.No bruits.  Chest: Clear to ascultation bilaterally.markedly reduced air entry bilaterally Non tender to palpation  Breast: No asymetry,no masses. No nipple discharge or inversion. No axillary or supraclavicular adenopathy  Cardiovascular system; Heart sounds normal,  S1 and  S2 ,no S3.  No murmur, or thrill. Apical beat not displaced Peripheral pulses normal.  Abdomen: Soft, non tender, no organomegaly or masses. No bruits. Bowel sounds normal. No guarding, tenderness or rebound.  Rectal:  Normal sphincter tone. No hemorrhoids or  masses. guaiac negative stool. Prostate enlarged and nodular    Musculoskeletal exam: Full but reduced ROM of spine, hips , shoulders and knees. No deformity ,swelling or crepitus noted.  muscle wasting or atrophy from mild malnutrtion   Neurologic: Cranial nerves 2 to 12  intact. Power, tone ,sensation and reflexes normal throughout. No disturbance in gait. No tremor.  Skin: Intact, no ulceration, erythema , scaling or rash noted. Pigmentation normal throughout  Psych; Normal mood and affect. Judgement and concentration normal   Assessment & Plan:

## 2016-05-27 NOTE — Assessment & Plan Note (Signed)
Unintentional weight loss , 14 pounds in 1 year, reports early satiety, poor appetite and poor sleep. Not suicidal or homicidal,facing  his own mortality and failing health, does report some anxiety and probable mild depression No interest in imaging studies and testing , trial of Remeron and f/u in 2 month. Encourage pt to work on appetite improvement

## 2016-07-12 LAB — CBC
HCT: 43 % (ref 38.5–50.0)
Hemoglobin: 14.3 g/dL (ref 13.2–17.1)
MCH: 30.1 pg (ref 27.0–33.0)
MCHC: 33.3 g/dL (ref 32.0–36.0)
MCV: 90.5 fL (ref 80.0–100.0)
MPV: 9.6 fL (ref 7.5–12.5)
PLATELETS: 367 10*3/uL (ref 140–400)
RBC: 4.75 MIL/uL (ref 4.20–5.80)
RDW: 13.4 % (ref 11.0–15.0)
WBC: 6.4 10*3/uL (ref 3.8–10.8)

## 2016-07-12 LAB — PSA: PSA: 6.8 ng/mL — ABNORMAL HIGH (ref <=4.0)

## 2016-07-13 ENCOUNTER — Encounter: Payer: Self-pay | Admitting: Family Medicine

## 2016-07-13 ENCOUNTER — Ambulatory Visit (INDEPENDENT_AMBULATORY_CARE_PROVIDER_SITE_OTHER): Payer: Medicare HMO | Admitting: Family Medicine

## 2016-07-13 VITALS — BP 122/72 | HR 83 | Resp 20 | Ht 71.0 in | Wt 169.0 lb

## 2016-07-13 DIAGNOSIS — R972 Elevated prostate specific antigen [PSA]: Secondary | ICD-10-CM | POA: Diagnosis not present

## 2016-07-13 DIAGNOSIS — I10 Essential (primary) hypertension: Secondary | ICD-10-CM

## 2016-07-13 DIAGNOSIS — Z23 Encounter for immunization: Secondary | ICD-10-CM | POA: Diagnosis not present

## 2016-07-13 DIAGNOSIS — R634 Abnormal weight loss: Secondary | ICD-10-CM | POA: Diagnosis not present

## 2016-07-13 LAB — PSA, MEDICARE

## 2016-07-13 MED ORDER — RANITIDINE HCL 150 MG PO TABS: ORAL_TABLET | ORAL | 1 refills | Status: DC

## 2016-07-13 MED ORDER — HYDROCHLOROTHIAZIDE 25 MG PO TABS: 25.0000 mg | ORAL_TABLET | Freq: Every day | ORAL | 1 refills | Status: DC

## 2016-07-13 MED ORDER — LOVASTATIN 40 MG PO TABS: 40.0000 mg | ORAL_TABLET | Freq: Every day | ORAL | 1 refills | Status: DC

## 2016-07-13 NOTE — Progress Notes (Signed)
NOTED & DONE 

## 2016-07-13 NOTE — Patient Instructions (Signed)
Annual wellness and visit with MD  in January , ,call if you need me before  Flu vaccine today  Thank you  for choosing [facility]. We consider it a privelige to serve you.  Delivering excellent health care in a caring and  compassionate way is our goal.  Partnering with you,  so that together we can achieve this goal is our strategy.

## 2016-07-19 ENCOUNTER — Other Ambulatory Visit: Payer: Self-pay | Admitting: Family Medicine

## 2016-07-26 DIAGNOSIS — Z23 Encounter for immunization: Secondary | ICD-10-CM | POA: Insufficient documentation

## 2016-07-26 NOTE — Assessment & Plan Note (Signed)
Slightly elevated since checked approx 2 years ago, pt prefers to follow through with plan when he last saw urologist which is to "do nothing", states if he changes his mind will call Does c/o some difficulty with urination, last exam revealed enlarged harp gland

## 2016-07-26 NOTE — Assessment & Plan Note (Signed)
Controlled, no change in medication  

## 2016-07-26 NOTE — Assessment & Plan Note (Signed)
Weight essentially stable since last visit, pt very aware of his need to eat to maintain this weight , states this is what he will do, will re evaluate in 4 months

## 2016-07-26 NOTE — Progress Notes (Signed)
   TRANELL GUTZMER     MRN: [id number]      DOB: 1935-03-24   HPI Mr. Rouland is here for follow up and re-evaluation of chronic medical conditions, medication management and review of any available recent lab and radiology data.  Preventive health is updated, specifically  Cancer screening and Immunization.    There are no new concerns.  There are no specific complaints  Wife accompanies him today, she remains concerned that despite a good apetite , he continues to lose weight.Also here to review fact that recent PSA is abnormal, not much different from previous, thankfully and direction pt wants to take with this, which is nothing  ROS Denies recent fever or chills. Denies sinus pressure, nasal congestion, ear pain or sore throat. Denies chest pains, palpitations and leg swelling Denies abdominal pain, nausea, vomiting,diarrhea or constipation.   Denies dysuria, frequency, hesitancy or incontinence. Denies joint pain, swelling and limitation in mobility. Denies headaches, seizures, numbness, or tingling. Denies depression, anxiety or insomnia.  PE  BP 122/72   Pulse 83   Resp 20   Ht 5\' 11"  (1.803 m)   Wt 169 lb (76.7 kg)   SpO2 90%   BMI 23.57 kg/m   Patient alert and oriented and in no cardiopulmonary distress.  HEENT: No facial asymmetry, EOMI,   oropharynx pink and moist.  Neck supple no JVD, no mass.  Chest: Clear to auscultation bilaterally.Decreased air entry throughout  CVS: S1, S2 no murmurs, no S3.Regular rate.  ABD: Soft non tender.   Ext: No edema  MS: Adequate though reduced  ROM spine, shoulders, hips and knees.  Skin: Intact, no ulcerations or rash noted.  Psych: Good eye contact, normal affect. Memory intact not anxious or depressed appearing.  CNS: CN 2-12 intact, power,  normal throughout.no focal deficits noted.   Assessment & Plan  Elevated PSA Slightly elevated since checked approx 2 years ago, pt prefers to follow through with plan when he  last saw urologist which is to "do nothing", states if he changes his mind will call Does c/o some difficulty with urination, last exam revealed enlarged harp gland  Loss of weight Weight essentially stable since last visit, pt very aware of his need to eat to maintain this weight , states this is what he will do, will re evaluate in 4 months  Essential hypertension Controlled, no change in medication   Need for prophylactic vaccination and inoculation against influenza After obtaining informed consent, the vaccine is  administered by LPN.

## 2016-07-26 NOTE — Assessment & Plan Note (Signed)
After obtaining informed consent, the vaccine is  administered by LPN.  

## 2016-08-30 ENCOUNTER — Other Ambulatory Visit: Payer: Self-pay | Admitting: Family Medicine

## 2016-09-07 ENCOUNTER — Other Ambulatory Visit: Payer: Self-pay

## 2016-09-15 ENCOUNTER — Other Ambulatory Visit: Payer: Self-pay

## 2016-09-15 ENCOUNTER — Telehealth: Payer: Self-pay | Admitting: Family Medicine

## 2016-09-15 DIAGNOSIS — F3289 Other specified depressive episodes: Secondary | ICD-10-CM

## 2016-09-15 MED ORDER — IPRATROPIUM BROMIDE 0.02 % IN SOLN: RESPIRATORY_TRACT | 5 refills | Status: DC

## 2016-09-15 MED ORDER — FLUTICASONE-SALMETEROL 500-50 MCG/DOSE IN AEPB: 1.0000 | INHALATION_SPRAY | Freq: Two times a day (BID) | RESPIRATORY_TRACT | 5 refills | Status: DC

## 2016-09-15 MED ORDER — LOVASTATIN 40 MG PO TABS: 40.0000 mg | ORAL_TABLET | Freq: Every day | ORAL | 1 refills | Status: DC

## 2016-09-15 MED ORDER — RANITIDINE HCL 150 MG PO TABS: ORAL_TABLET | ORAL | 1 refills | Status: DC

## 2016-09-15 MED ORDER — ALBUTEROL SULFATE (2.5 MG/3ML) 0.083% IN NEBU: INHALATION_SOLUTION | RESPIRATORY_TRACT | 5 refills | Status: DC

## 2016-09-15 MED ORDER — ERGOCALCIFEROL 1.25 MG (50000 UT) PO CAPS: 50000.0000 [IU] | ORAL_CAPSULE | ORAL | 1 refills | Status: DC

## 2016-09-15 MED ORDER — PREDNISONE 5 MG PO TABS: ORAL_TABLET | ORAL | 0 refills | Status: DC

## 2016-09-15 MED ORDER — HYDROCHLOROTHIAZIDE 25 MG PO TABS: 25.0000 mg | ORAL_TABLET | Freq: Every day | ORAL | 1 refills | Status: DC

## 2016-09-15 MED ORDER — ALBUTEROL SULFATE HFA 108 (90 BASE) MCG/ACT IN AERS: INHALATION_SPRAY | RESPIRATORY_TRACT | 3 refills | Status: DC

## 2016-09-15 MED ORDER — MIRTAZAPINE 7.5 MG PO TABS: 7.5000 mg | ORAL_TABLET | Freq: Every day | ORAL | 0 refills | Status: DC

## 2016-09-16 NOTE — Telephone Encounter (Signed)
OPENED IN ERROR

## 2016-11-24 ENCOUNTER — Ambulatory Visit (INDEPENDENT_AMBULATORY_CARE_PROVIDER_SITE_OTHER): Payer: Medicare HMO

## 2016-11-24 VITALS — BP 110/78 | HR 87 | Temp 97.6°F | Resp 18 | Ht 71.0 in | Wt 173.1 lb

## 2016-11-24 DIAGNOSIS — Z0000 Encounter for general adult medical examination without abnormal findings: Secondary | ICD-10-CM

## 2016-11-24 NOTE — Patient Instructions (Addendum)
Health maintenance: Due for Tetanus, please check with insurance to see if this is covered.  Abnormal screenings: None    Patient concerns: Medication assistance with Advair and Proair. Community resource referral sent to Care guide today. You should receive a call within 1-2 weeks.   Nurse concerns: None   Next PCP appt: 12/28/2016 with Dr. Moshe Cipro at 8:00 am  Please bring a copy of your POA (Power of [place]) and/or Living Will to your next appointment.    Health Maintenance, Male A healthy lifestyle and preventative care can promote health and wellness.  Maintain regular health, dental, and eye exams.  Eat a healthy diet. Foods like vegetables, fruits, whole grains, low-fat dairy products, and lean protein foods contain the nutrients you need and are low in calories. Decrease your intake of foods high in solid fats, added sugars, and salt. Get information about a proper diet from your health care provider, if necessary.  Regular physical exercise is one of the most important things you can do for your health. Most adults should get at least 150 minutes of moderate-intensity exercise (any activity that increases your heart rate and causes you to sweat) each week. In addition, most adults need muscle-strengthening exercises on 2 or more days a week.   Maintain a healthy weight. The body mass index (BMI) is a screening tool to identify possible weight problems. It provides an estimate of body fat based on height and weight. Your health care provider can find your BMI and can help you achieve or maintain a healthy weight. For males 20 years and older:  A BMI below 18.5 is considered underweight.  A BMI of 18.5 to 24.9 is normal.  A BMI of 25 to 29.9 is considered overweight.  A BMI of 30 and above is considered obese.  Maintain normal blood lipids and cholesterol by exercising and minimizing your intake of saturated fat. Eat a balanced diet with plenty of fruits and vegetables.  Blood tests for lipids and cholesterol should begin at age 34 and be repeated every 5 years. If your lipid or cholesterol levels are high, you are over age 78, or you are at high risk for heart disease, you may need your cholesterol levels checked more frequently.Ongoing high lipid and cholesterol levels should be treated with medicines if diet and exercise are not working.  If you smoke, find out from your health care provider how to quit. If you do not use tobacco, do not start.  Lung cancer screening is recommended for adults aged 65-80 years who are at high risk for developing lung cancer because of a history of smoking. A yearly low-dose CT scan of the lungs is recommended for people who have at least a 30-pack-year history of smoking and are current smokers or have quit within the past 15 years. A pack year of smoking is smoking an average of 1 pack of cigarettes a day for 1 year (for example, a 30-pack-year history of smoking could mean smoking 1 pack a day for 30 years or 2 packs a day for 15 years). Yearly screening should continue until the smoker has stopped smoking for at least 15 years. Yearly screening should be stopped for people who develop a health problem that would prevent them from having lung cancer treatment.  If you choose to drink alcohol, do not have more than 2 drinks per day. One drink is considered to be 12 oz (360 mL) of beer, 5 oz (150 mL) of wine, or 1.5 oz (  45 mL) of liquor.  Avoid the use of street drugs. Do not share needles with anyone. Ask for help if you need support or instructions about stopping the use of drugs.  High blood pressure causes heart disease and increases the risk of stroke. High blood pressure is more likely to develop in:  People who have blood pressure in the end of the normal range (100-139/85-89 mm Hg).  People who are overweight or obese.  People who are African American.  If you are 60-81 years of age, have your blood pressure checked  every 3-5 years. If you are 58 years of age or older, have your blood pressure checked every year. You should have your blood pressure measured twice-once when you are at a hospital or clinic, and once when you are not at a hospital or clinic. Record the average of the two measurements. To check your blood pressure when you are not at a hospital or clinic, you can use:  An automated blood pressure machine at a pharmacy.  A home blood pressure monitor.  If you are 61-60 years old, ask your health care provider if you should take aspirin to prevent heart disease.  Diabetes screening involves taking a blood sample to check your fasting blood sugar level. This should be done once every 3 years after age 70 if you are at a normal weight and without risk factors for diabetes. Testing should be considered at a younger age or be carried out more frequently if you are overweight and have at least 1 risk factor for diabetes.  Colorectal cancer can be detected and often prevented. Most routine colorectal cancer screening begins at the age of 31 and continues through age 92. However, your health care provider may recommend screening at an earlier age if you have risk factors for colon cancer. On a yearly basis, your health care provider may provide home test kits to check for hidden blood in the stool. A small camera at the end of a tube may be used to directly examine the colon (sigmoidoscopy or colonoscopy) to detect the earliest forms of colorectal cancer. Talk to your health care provider about this at age 8 when routine screening begins. A direct exam of the colon should be repeated every 5-10 years through age 24, unless early forms of precancerous polyps or small growths are found.  People who are at an increased risk for hepatitis B should be screened for this virus. You are considered at high risk for hepatitis B if:  You were born in a country where hepatitis B occurs often. Talk with your health care  provider about which countries are considered high risk.  Your parents were born in a high-risk country and you have not received a shot to protect against hepatitis B (hepatitis B vaccine).  You have HIV or AIDS.  You use needles to inject street drugs.  You live with, or have sex with, someone who has hepatitis B.  You are a man who has sex with other men (MSM).  You get hemodialysis treatment.  You take certain medicines for conditions like cancer, organ transplantation, and autoimmune conditions.  Hepatitis C blood testing is recommended for all people born from [birth year] through 1965 and any individual with known risk factors for hepatitis C.  Healthy men should no longer receive prostate-specific antigen (PSA) blood tests as part of routine cancer screening. Talk to your health care provider about prostate cancer screening.  Testicular cancer screening is not recommended for  adolescents or adult males who have no symptoms. Screening includes self-exam, a health care provider exam, and other screening tests. Consult with your health care provider about any symptoms you have or any concerns you have about testicular cancer.  Practice safe sex. Use condoms and avoid high-risk sexual practices to reduce the spread of sexually transmitted infections (STIs).  You should be screened for STIs, including gonorrhea and chlamydia if:  You are sexually active and are younger than 24 years.  You are older than 24 years, and your health care provider tells you that you are at risk for this type of infection.  Your sexual activity has changed since you were last screened, and you are at an increased risk for chlamydia or gonorrhea. Ask your health care provider if you are at risk.  If you are at risk of being infected with HIV, it is recommended that you take a prescription medicine daily to prevent HIV infection. This is called pre-exposure prophylaxis (PrEP). You are considered at risk if:  You  are a man who has sex with other men (MSM).  You are a heterosexual man who is sexually active with multiple partners.  You take drugs by injection.  You are sexually active with a partner who has HIV.  Talk with your health care provider about whether you are at high risk of being infected with HIV. If you choose to begin PrEP, you should first be tested for HIV. You should then be tested every 3 months for as long as you are taking PrEP.  Use sunscreen. Apply sunscreen liberally and repeatedly throughout the day. You should seek shade when your shadow is shorter than you. Protect yourself by wearing long sleeves, pants, a wide-brimmed hat, and sunglasses year round whenever you are outdoors.  Tell your health care provider of new moles or changes in moles, especially if there is a change in shape or color. Also, tell your health care provider if a mole is larger than the size of a pencil eraser.  A one-time screening for abdominal aortic aneurysm (AAA) and surgical repair of large AAAs by ultrasound is recommended for men aged 65-75 years who are current or former smokers.  Stay current with your vaccines (immunizations). This information is not intended to replace advice given to you by your health care provider. Make sure you discuss any questions you have with your health care provider. Document Released: 05/06/2008 Document Revised: 11/29/2014 Document Reviewed: 08/12/2015 Elsevier Interactive Patient Education  2017 [employer].

## 2016-11-24 NOTE — Progress Notes (Signed)
Subjective:   Kyle Mosley is a 82 y.o. male who presents for Medicare Annual/Subsequent preventive examination.  Review of Systems:  Cardiac Risk Factors include: advanced age (>76men, >7 women);family history of premature cardiovascular disease;dyslipidemia;hypertension;male gender;smoking/ tobacco exposure     Objective:    Vitals: BP 110/78   Pulse 87   Temp 97.6 F (36.4 C) (Oral)   Resp 18   Ht 5\' 11"  (1.803 m)   Wt 173 lb 1.3 oz (78.5 kg)   SpO2 (!) 84%   BMI 24.14 kg/m   Body mass index is 24.14 kg/m.  Tobacco History  Smoking Status  . Former Smoker  . Packs/day: 1.00  . Years: 16.00  . Quit date: 11/24/1996  Smokeless Tobacco  . Never Used     Counseling given: Not Answered   Past Medical History:  Diagnosis Date  . Asthma   . Colon polyps   . COPD (chronic obstructive pulmonary disease) ([place])   . Diverticulosis   . Enlarged prostate    with elevated PSA  . Hyperlipidemia   . Hypertension   . Prediabetes 2011  . Pulmonary hypertension 2012   Past Surgical History:  Procedure Laterality Date  . COLONOSCOPY  03/21/2007   Dr. Jonny Ruiz diverticula, normal rectum  . COLONOSCOPY  05/04/2012   Procedure: COLONOSCOPY;  Surgeon: Daneil Dolin, MD;  Location: [facility];  Service: Endoscopy;  Laterality: N/A;  7:30  . PROSTATE BIOPSY     Family History  Problem Relation Age of Onset  . Heart attack Father   . Alcohol abuse Mother   . Hypertension Brother   . COPD Sister    History  Sexual Activity  . Sexual activity: Not Currently    Outpatient Encounter Prescriptions as of 11/24/2016  Medication Sig  . albuterol (PROAIR HFA) 108 (90 Base) MCG/ACT inhaler INHALE 2 PUFFS BY MOUTH EVERY 6 TO 8 HOURS AS NEEDED FOR WHEEZING  . albuterol (PROVENTIL) (2.5 MG/3ML) 0.083% nebulizer solution USE ONE VIAL IN NEBULIZER TWO TO THREE TIMES A DAY.  Marland Kitchen aspirin EC 81 MG tablet Take 81 mg by mouth daily.  . ergocalciferol (VITAMIN D2) 50000 units  capsule Take 1 capsule (50,000 Units total) by mouth once a week. One capsule once weekly  . Fluticasone-Salmeterol (ADVAIR DISKUS) 500-50 MCG/DOSE AEPB Inhale 1 puff into the lungs 2 (two) times daily.  . hydrochlorothiazide (HYDRODIURIL) 25 MG tablet Take 1 tablet (25 mg total) by mouth daily.  Marland Kitchen ipratropium (ATROVENT) 0.02 % nebulizer solution USE ONE VIAL IN NEBULIZER TWO TO THREE TIMES A DAY.  Marland Kitchen lovastatin (MEVACOR) 40 MG tablet Take 1 tablet (40 mg total) by mouth at bedtime.  . mirtazapine (REMERON) 7.5 MG tablet Take 1 tablet (7.5 mg total) by mouth at bedtime.  . predniSONE (DELTASONE) 5 MG tablet TAKE 1 TABLET(5 MG) BY MOUTH DAILY  . ranitidine (ZANTAC) 150 MG tablet TAKE 1 TABLET(150 MG) BY MOUTH DAILY   No facility-administered encounter medications on file as of 11/24/2016.     Activities of Daily Living In your present state of health, do you have any difficulty performing the following activities: 11/24/2016 05/27/2016  Hearing? Y N  Vision? N N  Difficulty concentrating or making decisions? N N  Walking or climbing stairs? Y Y  Dressing or bathing? N N  Doing errands, shopping? N N  Preparing Food and eating ? N -  Using the Toilet? N -  In the past six months, have you accidently leaked urine?  N -  Do you have problems with loss of bowel control? N -  Managing your Medications? N -  Managing your Finances? N -  Housekeeping or managing your Housekeeping? N -  Some recent data might be hidden    Patient Care Team: Fayrene Helper, MD as PCP - General Daneil Dolin, MD (Gastroenterology) Herminio Commons, MD as Attending Physician (Cardiology) Madelin Headings, DO (Optometry) Irine Seal, MD as Attending Physician (Urology)   Assessment:    Exercise Activities and Dietary recommendations Current Exercise Habits: Home exercise routine, Type of exercise: stretching, Time (Minutes): 10, Frequency (Times/Week): 6, Weekly Exercise (Minutes/Week): 60, Intensity: Mild,  Exercise limited by: respiratory conditions(s)  Goals    . Increase water intake          Patient would like to increase his water intake to 20 ounces a day, or 5 small bottles of water.      Fall Risk Fall Risk  11/24/2016 01/22/2016 09/10/2015 03/26/2015 05/27/2014  Falls in the past year? Yes Yes Yes Yes Yes  Number falls in past yr: 1 1 1 1 1   Injury with Fall? No - No No No  Risk for fall due to : - - - - -  Follow up Falls prevention discussed - - - -   Depression Screen PHQ 2/9 Scores 11/24/2016 01/22/2016 06/07/2013  PHQ - 2 Score 0 3 0  PHQ- 9 Score - 16 -    Cognitive Function  Normal   6CIT Screen 11/24/2016  What Year? 0 points  What month? 0 points  What time? 0 points  Count back from 20 0 points  Months in reverse 2 points  Repeat phrase 0 points  Total Score 2    Immunization History  Administered Date(s) Administered  . H1N1 09/17/2008  . Influenza Split 08/13/2014  . Influenza Whole 09/06/2007, 09/04/2008, 07/31/2009, 08/07/2010  . Influenza,inj,Quad PF,36+ Mos 09/10/2015, 07/13/2016  . Pneumococcal Conjugate-13 05/27/2014  . Pneumococcal Polysaccharide-23 04/05/2004  . Td 07/20/2005  . Zoster 02/15/2012   Screening Tests Health Maintenance  Topic Date Due  . TETANUS/TDAP  10/05/2017 (Originally 07/21/2015)  . COLONOSCOPY  05/04/2017  . INFLUENZA VACCINE  Completed  . ZOSTAVAX  Completed  . PNA vac Low Risk Adult  Completed      Plan:    I have personally reviewed and addressed the Medicare Annual Wellness questionnaire and have noted the following in the patient's chart:  A. Medical and social history B. Use of alcohol, tobacco or illicit drugs  C. Current medications and supplements D. Functional ability and status E.  Nutritional status F.  Physical activity G. Advance directives H. List of other physicians I.  Hospitalizations, surgeries, and ER visits in previous 12 months J.  [place] to include hearing, vision, cognitive,  depression L. Referrals and appointments   In addition, I have reviewed and discussed with patient certain preventive protocols, quality metrics, and best practice recommendations. A written personalized care plan for preventive services as well as general preventive health recommendations were provided to patient.  Signed,   Stormy Fabian, LPN Lead Nurse Health Advisor

## 2016-11-25 ENCOUNTER — Ambulatory Visit: Payer: Medicare HMO

## 2016-11-25 ENCOUNTER — Ambulatory Visit: Payer: Medicare HMO | Admitting: Family Medicine

## 2016-12-03 DIAGNOSIS — J449 Chronic obstructive pulmonary disease, unspecified: Secondary | ICD-10-CM | POA: Diagnosis not present

## 2016-12-03 DIAGNOSIS — E785 Hyperlipidemia, unspecified: Secondary | ICD-10-CM | POA: Diagnosis not present

## 2016-12-27 ENCOUNTER — Other Ambulatory Visit: Payer: Self-pay | Admitting: Family Medicine

## 2016-12-27 DIAGNOSIS — F3289 Other specified depressive episodes: Secondary | ICD-10-CM

## 2016-12-28 ENCOUNTER — Ambulatory Visit (INDEPENDENT_AMBULATORY_CARE_PROVIDER_SITE_OTHER): Payer: Medicare HMO | Admitting: Family Medicine

## 2016-12-28 ENCOUNTER — Encounter: Payer: Self-pay | Admitting: Family Medicine

## 2016-12-28 VITALS — BP 120/80 | HR 88 | Resp 17 | Ht 71.0 in | Wt 164.0 lb

## 2016-12-28 DIAGNOSIS — E785 Hyperlipidemia, unspecified: Secondary | ICD-10-CM | POA: Diagnosis not present

## 2016-12-28 DIAGNOSIS — R0902 Hypoxemia: Secondary | ICD-10-CM | POA: Insufficient documentation

## 2016-12-28 DIAGNOSIS — R972 Elevated prostate specific antigen [PSA]: Secondary | ICD-10-CM

## 2016-12-28 DIAGNOSIS — I272 Pulmonary hypertension, unspecified: Secondary | ICD-10-CM

## 2016-12-28 DIAGNOSIS — I10 Essential (primary) hypertension: Secondary | ICD-10-CM

## 2016-12-28 DIAGNOSIS — E559 Vitamin D deficiency, unspecified: Secondary | ICD-10-CM

## 2016-12-28 DIAGNOSIS — J449 Chronic obstructive pulmonary disease, unspecified: Secondary | ICD-10-CM

## 2016-12-28 DIAGNOSIS — R634 Abnormal weight loss: Secondary | ICD-10-CM

## 2016-12-28 MED ORDER — ALBUTEROL SULFATE HFA 108 (90 BASE) MCG/ACT IN AERS: 2.0000 | INHALATION_SPRAY | Freq: Four times a day (QID) | RESPIRATORY_TRACT | 5 refills | Status: DC | PRN

## 2016-12-28 MED ORDER — PREDNISONE 5 MG PO TABS: ORAL_TABLET | ORAL | 0 refills | Status: DC

## 2016-12-28 NOTE — Assessment & Plan Note (Signed)
Stable weight since last visit

## 2016-12-28 NOTE — Progress Notes (Signed)
120 80 

## 2016-12-28 NOTE — Assessment & Plan Note (Signed)
Stony hard prostate, enlarged in 2017, concern remains that he has prostate cancer, no interest in additional biopsies and has been advised against this in the past by urology,

## 2016-12-28 NOTE — Progress Notes (Signed)
   Kyle Mosley     MRN: [id number]      DOB: 08-06-1935   HPI Kyle Mosley is here for follow up and re-evaluation of chronic medical conditions, medication management and review of any available recent lab and radiology data.  The PT denies any adverse reactions to current medications since the last visit.  Does c/o increased shortness of breath and fatigue States he is doing his best as far as eating is concerned. Does not get out as much as in the past  Wants to hold off on CXR, labs or pulmonary consult at this time  ROS Denies recent fever or chills.Dores feel weak and short of breath with activity. Denies sinus pressure, nasal congestion, ear pain or sore throat. Denies chest congestion, productive cough or uncontrolled  wheezing. Denies chest pains, palpitations and leg swelling Denies abdominal pain, nausea, vomiting,diarrhea or constipation.   Denies dysuria, frequency, hesitancy or incontinence. C/o  limitation in mobility. Denies headaches, seizures, numbness, or tingling. Denies depression, anxiety or insomnia. Denies skin break down or rash.   PE  BP 120/80   Pulse 88   Resp 17   Ht 5\' 11"  (1.803 m)   Wt 164 lb (74.4 kg)   SpO2 (!) 87% Comment: 3 liters  BMI 22.87 kg/m   Patient alert and oriented and in pulmonary distress.Chronically ill appearing with bitemporal wasting, hypoXic to 83% ON 2 L OXYGEN, INCREASED RATE TO 3 LITERS , OXYGEN LEVEL UP TO 87%, LIKELY NEEDS HIGHER OXYGEN LEVEL, BUT NEEDS BLOOD GASES DRAWN TO ASSESS CORRECT SETTING, AND ALSO PULMONARY INVOLVEMENT, DECLINES AT THIS TIME  HEENT: No facial asymmetry, EOMI,   oropharynx pink and moist.  Neck decreased ROM no JVD, no mass.  Chest: Clear to auscultation bilaterally.Markedly reduced air entry bilaterally,  Left base worse, with nearly absent breath sounds  CVS: S1, S2 no murmurs, no S3.Regular rate.  ABD: Soft non tender.   Ext: No edema  MS: decreased  ROM spine, shoulders, hips and  knees.  Skin: Intact, no ulcerations or rash noted.  Psych: Good eye contact, normal affect. Memory intact not anxious or depressed appearing.  CNS: CN 2-12 intact, power,  normal throughout.no focal deficits noted.   Assessment & Plan  COPD (chronic obstructive pulmonary disease) (HCC) Deteriorating, oxygen increased to 3 l/ min, reecximmend re evaluation by pulmonary however currently refusing Also recommended CXR , pt refused  Elevated PSA Stony hard prostate, enlarged in 2017, concern remains that he has prostate cancer, no interest in additional biopsies and has been advised against this in the past by urology,   Loss of weight Stable weight since last visit  Hypoxia Increase  In supplemental oxygen required, needs pulmonary eval for correct dose adjustment , currently refusing

## 2016-12-28 NOTE — Assessment & Plan Note (Signed)
Deteriorating, oxygen increased to 3 l/ min, reecximmend re evaluation by pulmonary however currently refusing Also recommended CXR , pt refused

## 2016-12-28 NOTE — Assessment & Plan Note (Signed)
Increase  In supplemental oxygen required, needs pulmonary eval for correct dose adjustment , currently refusing

## 2016-12-28 NOTE — Patient Instructions (Addendum)
F/u in 4 month, call if you need me me before  Increase oxygen flow to 3 liters, new order sent  Get CXR which is ordered when you are ready please , you just need to go to the hospital  We will order new breathing machine and higher oxygen flow   Fasting lipid, cmp, CBC, pSA and vit D in 4 month     Continue all current meds  Let me know if you decide to gethelp from a lung specialist since oxygen is still low please

## 2016-12-28 NOTE — Assessment & Plan Note (Deleted)
Controlled, no change in medication  

## 2017-01-03 DIAGNOSIS — J449 Chronic obstructive pulmonary disease, unspecified: Secondary | ICD-10-CM | POA: Diagnosis not present

## 2017-01-03 DIAGNOSIS — E785 Hyperlipidemia, unspecified: Secondary | ICD-10-CM | POA: Diagnosis not present

## 2017-01-13 ENCOUNTER — Other Ambulatory Visit: Payer: Self-pay

## 2017-01-13 DIAGNOSIS — F329 Major depressive disorder, single episode, unspecified: Secondary | ICD-10-CM

## 2017-01-13 DIAGNOSIS — F32A Depression, unspecified: Secondary | ICD-10-CM

## 2017-01-13 MED ORDER — RANITIDINE HCL 150 MG PO TABS: ORAL_TABLET | ORAL | 1 refills | Status: DC

## 2017-01-13 MED ORDER — LOVASTATIN 40 MG PO TABS: 40.0000 mg | ORAL_TABLET | Freq: Every day | ORAL | 1 refills | Status: DC

## 2017-01-13 MED ORDER — HYDROCHLOROTHIAZIDE 25 MG PO TABS: 25.0000 mg | ORAL_TABLET | Freq: Every day | ORAL | 1 refills | Status: DC

## 2017-01-13 MED ORDER — IPRATROPIUM BROMIDE 0.02 % IN SOLN: RESPIRATORY_TRACT | 5 refills | Status: DC

## 2017-01-13 MED ORDER — ERGOCALCIFEROL 1.25 MG (50000 UT) PO CAPS: 50000.0000 [IU] | ORAL_CAPSULE | ORAL | 1 refills | Status: DC

## 2017-01-13 MED ORDER — ALBUTEROL SULFATE (2.5 MG/3ML) 0.083% IN NEBU: INHALATION_SOLUTION | RESPIRATORY_TRACT | 5 refills | Status: DC

## 2017-01-13 MED ORDER — PREDNISONE 5 MG PO TABS: ORAL_TABLET | ORAL | 0 refills | Status: DC

## 2017-01-13 MED ORDER — MIRTAZAPINE 7.5 MG PO TABS: 7.5000 mg | ORAL_TABLET | Freq: Every day | ORAL | 0 refills | Status: DC

## 2017-01-31 DIAGNOSIS — E785 Hyperlipidemia, unspecified: Secondary | ICD-10-CM | POA: Diagnosis not present

## 2017-01-31 DIAGNOSIS — J449 Chronic obstructive pulmonary disease, unspecified: Secondary | ICD-10-CM | POA: Diagnosis not present

## 2017-02-17 ENCOUNTER — Encounter (HOSPITAL_COMMUNITY): Payer: Self-pay

## 2017-02-17 ENCOUNTER — Emergency Department (HOSPITAL_COMMUNITY): Payer: Medicare HMO

## 2017-02-17 ENCOUNTER — Inpatient Hospital Stay (HOSPITAL_COMMUNITY)
Admission: EM | Admit: 2017-02-17 | Discharge: 2017-02-20 | DRG: 190 | Disposition: A | Discharge status: Home / Self Care | Payer: Medicare HMO | Attending: Internal Medicine | Admitting: Internal Medicine

## 2017-02-17 DIAGNOSIS — R7303 Prediabetes: Secondary | ICD-10-CM | POA: Diagnosis present

## 2017-02-17 DIAGNOSIS — Z7952 Long term (current) use of systemic steroids: Secondary | ICD-10-CM | POA: Diagnosis not present

## 2017-02-17 DIAGNOSIS — Z7982 Long term (current) use of aspirin: Secondary | ICD-10-CM

## 2017-02-17 DIAGNOSIS — E43 Unspecified severe protein-calorie malnutrition: Secondary | ICD-10-CM | POA: Insufficient documentation

## 2017-02-17 DIAGNOSIS — E785 Hyperlipidemia, unspecified: Secondary | ICD-10-CM | POA: Diagnosis present

## 2017-02-17 DIAGNOSIS — Z86010 Personal history of colonic polyps: Secondary | ICD-10-CM | POA: Diagnosis not present

## 2017-02-17 DIAGNOSIS — J441 Chronic obstructive pulmonary disease with (acute) exacerbation: Secondary | ICD-10-CM | POA: Diagnosis not present

## 2017-02-17 DIAGNOSIS — Z87891 Personal history of nicotine dependence: Secondary | ICD-10-CM

## 2017-02-17 DIAGNOSIS — Z6821 Body mass index (BMI) 21.0-21.9, adult: Secondary | ICD-10-CM | POA: Diagnosis not present

## 2017-02-17 DIAGNOSIS — Z825 Family history of asthma and other chronic lower respiratory diseases: Secondary | ICD-10-CM

## 2017-02-17 DIAGNOSIS — R7302 Impaired glucose tolerance (oral): Secondary | ICD-10-CM | POA: Diagnosis present

## 2017-02-17 DIAGNOSIS — J9621 Acute and chronic respiratory failure with hypoxia: Secondary | ICD-10-CM | POA: Diagnosis not present

## 2017-02-17 DIAGNOSIS — J189 Pneumonia, unspecified organism: Secondary | ICD-10-CM | POA: Diagnosis present

## 2017-02-17 DIAGNOSIS — Z9114 Patient's other noncompliance with medication regimen: Secondary | ICD-10-CM

## 2017-02-17 DIAGNOSIS — R972 Elevated prostate specific antigen [PSA]: Secondary | ICD-10-CM | POA: Diagnosis present

## 2017-02-17 DIAGNOSIS — Z79899 Other long term (current) drug therapy: Secondary | ICD-10-CM

## 2017-02-17 DIAGNOSIS — J9620 Acute and chronic respiratory failure, unspecified whether with hypoxia or hypercapnia: Secondary | ICD-10-CM | POA: Diagnosis not present

## 2017-02-17 DIAGNOSIS — N400 Enlarged prostate without lower urinary tract symptoms: Secondary | ICD-10-CM | POA: Diagnosis present

## 2017-02-17 DIAGNOSIS — J9622 Acute and chronic respiratory failure with hypercapnia: Secondary | ICD-10-CM | POA: Diagnosis not present

## 2017-02-17 DIAGNOSIS — Z9981 Dependence on supplemental oxygen: Secondary | ICD-10-CM | POA: Diagnosis not present

## 2017-02-17 DIAGNOSIS — R0989 Other specified symptoms and signs involving the circulatory and respiratory systems: Secondary | ICD-10-CM | POA: Diagnosis not present

## 2017-02-17 DIAGNOSIS — T380X5A Adverse effect of glucocorticoids and synthetic analogues, initial encounter: Secondary | ICD-10-CM | POA: Diagnosis not present

## 2017-02-17 DIAGNOSIS — I10 Essential (primary) hypertension: Secondary | ICD-10-CM | POA: Diagnosis not present

## 2017-02-17 DIAGNOSIS — Z8249 Family history of ischemic heart disease and other diseases of the circulatory system: Secondary | ICD-10-CM

## 2017-02-17 DIAGNOSIS — R0602 Shortness of breath: Secondary | ICD-10-CM | POA: Diagnosis not present

## 2017-02-17 DIAGNOSIS — R0609 Other forms of dyspnea: Secondary | ICD-10-CM | POA: Diagnosis not present

## 2017-02-17 DIAGNOSIS — I272 Pulmonary hypertension, unspecified: Secondary | ICD-10-CM | POA: Diagnosis not present

## 2017-02-17 DIAGNOSIS — H109 Unspecified conjunctivitis: Secondary | ICD-10-CM | POA: Diagnosis present

## 2017-02-17 DIAGNOSIS — R0902 Hypoxemia: Secondary | ICD-10-CM | POA: Diagnosis not present

## 2017-02-17 LAB — PROCALCITONIN

## 2017-02-17 LAB — BASIC METABOLIC PANEL
Anion gap: 4 — ABNORMAL LOW (ref 5–15)
BUN: 19 mg/dL (ref 6–20)
CO2: 42 mmol/L — ABNORMAL HIGH (ref 22–32)
Calcium: 8.8 mg/dL — ABNORMAL LOW (ref 8.9–10.3)
Chloride: 90 mmol/L — ABNORMAL LOW (ref 101–111)
Creatinine, Ser: 0.78 mg/dL (ref 0.61–1.24)
GFR calc Af Amer: >60 mL/min (ref >60)
GFR calc non Af Amer: >60 mL/min (ref >60)
Glucose, Bld: 90 mg/dL (ref 65–99)
Potassium: 4.4 mmol/L (ref 3.5–5.1)
Sodium: 136 mmol/L (ref 135–145)

## 2017-02-17 LAB — CBC WITH DIFFERENTIAL/PLATELET
Basophils Absolute: 0 10*3/uL (ref 0.0–0.1)
Basophils Relative: 0 %
Eosinophils Absolute: 0 10*3/uL (ref 0.0–0.7)
Eosinophils Relative: 0 %
HCT: 49.9 % (ref 39.0–52.0)
Hemoglobin: 15.4 g/dL (ref 13.0–17.0)
Lymphocytes Relative: 5 %
Lymphs Abs: 0.3 10*3/uL — ABNORMAL LOW (ref 0.7–4.0)
MCH: 30.1 pg (ref 26.0–34.0)
MCHC: 30.9 g/dL (ref 30.0–36.0)
MCV: 97.7 fL (ref 78.0–100.0)
Monocytes Absolute: 0.5 10*3/uL (ref 0.1–1.0)
Monocytes Relative: 8 %
Neutro Abs: 4.9 10*3/uL (ref 1.7–7.7)
Neutrophils Relative %: 87 %
Platelets: 230 10*3/uL (ref 150–400)
RBC: 5.11 MIL/uL (ref 4.22–5.81)
RDW: 13.4 % (ref 11.5–15.5)
WBC: 5.7 10*3/uL (ref 4.0–10.5)

## 2017-02-17 MED ORDER — ACETAMINOPHEN 325 MG PO TABS: 650.0000 mg | ORAL_TABLET | Freq: Four times a day (QID) | ORAL | Status: DC | PRN

## 2017-02-17 MED ORDER — DOXYCYCLINE HYCLATE 100 MG PO TABS
100.0000 mg | ORAL_TABLET | Freq: Two times a day (BID) | ORAL | Status: DC
  Administered 2017-02-17: 100 mg via ORAL
  Filled 2017-02-17: qty 1

## 2017-02-17 MED ORDER — PRAVASTATIN SODIUM 40 MG PO TABS
40.0000 mg | ORAL_TABLET | Freq: Every day | ORAL | Status: DC
  Administered 2017-02-18 – 2017-02-19 (×2): 40 mg via ORAL
  Filled 2017-02-17 (×2): qty 1

## 2017-02-17 MED ORDER — IPRATROPIUM BROMIDE 0.02 % IN SOLN
0.5000 mg | Freq: Once | RESPIRATORY_TRACT | Status: AC
  Administered 2017-02-17: 0.5 mg via RESPIRATORY_TRACT
  Filled 2017-02-17: qty 2.5

## 2017-02-17 MED ORDER — ONDANSETRON HCL 4 MG/2ML IJ SOLN: 4.0000 mg | Freq: Four times a day (QID) | INTRAMUSCULAR | Status: DC | PRN

## 2017-02-17 MED ORDER — ALBUTEROL (5 MG/ML) CONTINUOUS INHALATION SOLN
15.0000 mg/h | INHALATION_SOLUTION | RESPIRATORY_TRACT | Status: DC
Start: 2017-02-17 — End: 2017-02-17
  Administered 2017-02-17: 15 mg/h via RESPIRATORY_TRACT
  Filled 2017-02-17: qty 20

## 2017-02-17 MED ORDER — ACETAMINOPHEN 650 MG RE SUPP: 650.0000 mg | Freq: Four times a day (QID) | RECTAL | Status: DC | PRN

## 2017-02-17 MED ORDER — HYDROCHLOROTHIAZIDE 25 MG PO TABS
25.0000 mg | ORAL_TABLET | Freq: Every day | ORAL | Status: DC
Start: 2017-02-18 — End: 2017-02-18

## 2017-02-17 MED ORDER — METHYLPREDNISOLONE SODIUM SUCC 125 MG IJ SOLR
60.0000 mg | Freq: Two times a day (BID) | INTRAMUSCULAR | Status: DC
  Administered 2017-02-17: 60 mg via INTRAVENOUS
  Filled 2017-02-17: qty 2

## 2017-02-17 MED ORDER — ALBUTEROL SULFATE (2.5 MG/3ML) 0.083% IN NEBU: 2.5000 mg | INHALATION_SOLUTION | RESPIRATORY_TRACT | Status: DC | PRN

## 2017-02-17 MED ORDER — IPRATROPIUM-ALBUTEROL 0.5-2.5 (3) MG/3ML IN SOLN
3.0000 mL | Freq: Three times a day (TID) | RESPIRATORY_TRACT | Status: DC
  Administered 2017-02-18 – 2017-02-20 (×7): 3 mL via RESPIRATORY_TRACT
  Filled 2017-02-17 (×7): qty 3

## 2017-02-17 MED ORDER — ENOXAPARIN SODIUM 40 MG/0.4ML ~~LOC~~ SOLN
40.0000 mg | SUBCUTANEOUS | Status: DC
  Administered 2017-02-17 – 2017-02-19 (×3): 40 mg via SUBCUTANEOUS
  Filled 2017-02-17 (×3): qty 0.4

## 2017-02-17 MED ORDER — ONDANSETRON HCL 4 MG PO TABS: 4.0000 mg | ORAL_TABLET | Freq: Four times a day (QID) | ORAL | Status: DC | PRN

## 2017-02-17 MED ORDER — MIRTAZAPINE 15 MG PO TABS
7.5000 mg | ORAL_TABLET | Freq: Every day | ORAL | Status: DC
  Administered 2017-02-17 – 2017-02-19 (×3): 7.5 mg via ORAL
  Filled 2017-02-17 (×3): qty 1

## 2017-02-17 MED ORDER — PREDNISONE 20 MG PO TABS
40.0000 mg | ORAL_TABLET | Freq: Once | ORAL | Status: AC
  Administered 2017-02-17: 40 mg via ORAL
  Filled 2017-02-17: qty 2

## 2017-02-17 MED ORDER — ASPIRIN EC 81 MG PO TBEC
81.0000 mg | DELAYED_RELEASE_TABLET | Freq: Every day | ORAL | Status: DC
  Administered 2017-02-18 – 2017-02-20 (×3): 81 mg via ORAL
  Filled 2017-02-17 (×3): qty 1

## 2017-02-17 MED ORDER — DEXTROSE 5 % IV SOLN
INTRAVENOUS | Status: AC
  Filled 2017-02-17: qty 10

## 2017-02-17 MED ORDER — FAMOTIDINE 20 MG PO TABS
20.0000 mg | ORAL_TABLET | Freq: Every day | ORAL | Status: DC
  Administered 2017-02-18 – 2017-02-20 (×3): 20 mg via ORAL
  Filled 2017-02-17 (×3): qty 1

## 2017-02-17 MED ORDER — DOCUSATE SODIUM 100 MG PO CAPS
100.0000 mg | ORAL_CAPSULE | Freq: Two times a day (BID) | ORAL | Status: DC
  Administered 2017-02-18 – 2017-02-20 (×5): 100 mg via ORAL
  Filled 2017-02-17 (×5): qty 1

## 2017-02-17 MED ORDER — IPRATROPIUM-ALBUTEROL 0.5-2.5 (3) MG/3ML IN SOLN
3.0000 mL | Freq: Four times a day (QID) | RESPIRATORY_TRACT | Status: DC
  Administered 2017-02-17: 3 mL via RESPIRATORY_TRACT
  Filled 2017-02-17: qty 3

## 2017-02-17 MED ORDER — DEXTROSE 5 % IV SOLN
1.0000 g | INTRAVENOUS | Status: DC
  Administered 2017-02-17: 1 g via INTRAVENOUS
  Filled 2017-02-17 (×3): qty 10

## 2017-02-17 NOTE — H&P (Addendum)
History and Physical    Kyle Mosley HAL:[id number] DOB: [birth year] DOA: 02/17/2017  PCP: Tula Nakayama, MD Consultants:  Neurology - ?[place]; Urology in [place]; [place] - GI; [facility] - pulmonology Patient coming from: home - lives with wife; NOK: wife, [phone]  Chief Complaint: SOB  HPI: Kyle Mosley is a 82 y.o. male with medical history significant of plumonary HTN, HTN, HLD, BPH, and COPD on 3L home O2/CPAP qhs presenting because "I got stopped up and it got worser and worser.  I thought I could fight it off but the medicine ran out" - albuterol inhaler and Advair, too early to fill.  +SOB all there time.  3L home O2.  CPAP at night - uncertain setting.  +cough, minimally productive, whitish sputum like a dry mouth.  No fevers.  No known sick contacts.  He was last seen by his PCP on 2/6.  She noted worsening oxygen requirement and recommendation to see pulmonology, which he refused.  She also suggested a CXR, which he refused.     ED Course: Given a continuous neb treatment and 40 mg PO Prednisone  Review of Systems: As per HPI; otherwise 10 point review of systems reviewed and negative.   Ambulatory Status:  Unsteady when he gets up and starts walking but okay once he gets going  Past Medical History:  Diagnosis Date  . Asthma   . Colon polyps   . COPD (chronic obstructive pulmonary disease) ([place])    3L home O2  . Diverticulosis   . Enlarged prostate    with elevated PSA  . Hyperlipidemia   . Hypertension   . Prediabetes 2011  . Pulmonary hypertension 2012    Past Surgical History:  Procedure Laterality Date  . COLONOSCOPY  03/21/2007   Dr. Jonny Ruiz diverticula, normal rectum  . COLONOSCOPY  05/04/2012   Procedure: COLONOSCOPY;  Surgeon: Daneil Dolin, MD;  Location: [facility];  Service: Endoscopy;  Laterality: N/A;  7:30  . PROSTATE BIOPSY      Social History   Social History  . Marital status: Married    Spouse name: N/A  . Number of  children: 3  . Years of education: N/A   Occupational History  . retired     Social History Main Topics  . Smoking status: Former Smoker    Packs/day: 1.00    Years: 25.00    Quit date: 11/24/1996  . Smokeless tobacco: Never Used  . Alcohol use No  . Drug use: No  . Sexual activity: Not Currently   Other Topics Concern  . Not on file   Social History Narrative  . No narrative on file    No Known Allergies  Family History  Problem Relation Age of Onset  . Heart attack Father   . Alcohol abuse Mother   . Hypertension Brother   . COPD Sister     Prior to Admission medications   Medication Sig Start Date End Date Taking? Authorizing Provider  albuterol (PROVENTIL) (2.5 MG/3ML) 0.083% nebulizer solution USE ONE VIAL IN NEBULIZER TWO TO THREE TIMES A DAY. 01/13/17  Yes Fayrene Helper, MD  albuterol (VENTOLIN HFA) 108 (90 Base) MCG/ACT inhaler Inhale 2 puffs into the lungs every 6 (six) hours as needed for wheezing or shortness of breath. 12/28/16  Yes Fayrene Helper, MD  aspirin EC 81 MG tablet Take 81 mg by mouth daily.   Yes Historical Provider, MD  ergocalciferol (VITAMIN D2) 50000 units capsule Take 1 capsule (  50,000 Units total) by mouth once a week. One capsule once weekly 01/13/17  Yes Fayrene Helper, MD  Fluticasone-Salmeterol (ADVAIR DISKUS) 500-50 MCG/DOSE AEPB Inhale 1 puff into the lungs 2 (two) times daily. 09/15/16  Yes Fayrene Helper, MD  hydrochlorothiazide (HYDRODIURIL) 25 MG tablet Take 1 tablet (25 mg total) by mouth daily. 01/13/17  Yes Fayrene Helper, MD  ipratropium (ATROVENT) 0.02 % nebulizer solution USE ONE VIAL IN NEBULIZER TWO TO THREE TIMES A DAY. 01/13/17  Yes Fayrene Helper, MD  lovastatin (MEVACOR) 40 MG tablet Take 1 tablet (40 mg total) by mouth at bedtime. Patient taking differently: Take 40 mg by mouth daily.  01/13/17  Yes Fayrene Helper, MD  mirtazapine (REMERON) 7.5 MG tablet Take 1 tablet (7.5 mg total) by mouth at  bedtime. 01/13/17  Yes Fayrene Helper, MD  predniSONE (DELTASONE) 5 MG tablet TAKE 1 TABLET(5 MG) BY MOUTH DAILY 01/13/17  Yes Fayrene Helper, MD  ranitidine (ZANTAC) 150 MG tablet TAKE 1 TABLET(150 MG) BY MOUTH DAILY 01/13/17  Yes Fayrene Helper, MD    Physical Exam: Vitals:   02/17/17 1751 02/17/17 1800 02/17/17 1830 02/17/17 1919  BP: 137/83 (!) 142/87 (!) 146/80 (!) 143/83  Pulse: 79 73 78 82  Resp: 16   20  Temp:    98.1 F (36.7 C)  TempSrc:    Oral  SpO2: 99% 95% 96% 94%  Weight:      Height:         General:   Appears calm and comfortable and is NAD Eyes:  PERRL, EOMI, normal lids, iris; right conjunctival injection with purulent drainage ENT:  grossly normal hearing, lips & tongue, mmm, poor dentition Neck:  no LAD, masses or thyromegaly Cardiovascular:  RRR, no m/r/g. 1-2+ LE edema, R slightly worse than L.  Respiratory:  Diffuse wheezing throughout, moderate air movement. Normal respiratory effort at rest but increased WOB with any movement including deep respirations. Abdomen:  soft, ntnd, NABS Skin:  no rash or induration seen on limited exam Musculoskeletal:  grossly normal tone BUE/BLE, good ROM, no bony abnormality Psychiatric:  grossly normal mood and affect, speech fluent and appropriate, AOx3 Neurologic:  CN 2-12 grossly intact, moves all extremities in coordinated fashion, sensation intact  Labs on Admission: I have personally reviewed following labs and imaging studies  CBC:  Recent Labs Lab 02/17/17 1623  WBC 5.7  NEUTROABS 4.9  HGB 15.4  HCT 49.9  MCV 97.7  PLT 272   Basic Metabolic Panel:  Recent Labs Lab 02/17/17 1710  NA 136  K 4.4  CL 90*  CO2 42*  GLUCOSE 90  BUN 19  CREATININE 0.78  CALCIUM 8.8*   GFR: Estimated Creatinine Clearance: 77.5 mL/min (by C-G formula based on SCr of 0.78 mg/dL). Liver Function Tests: No results for input(s): AST, ALT, ALKPHOS, BILITOT, PROT, ALBUMIN in the last 168 hours. No results for  input(s): LIPASE, AMYLASE in the last 168 hours. No results for input(s): AMMONIA in the last 168 hours. Coagulation Profile: No results for input(s): INR, PROTIME in the last 168 hours. Cardiac Enzymes: No results for input(s): CKTOTAL, CKMB, CKMBINDEX, TROPONINI in the last 168 hours. BNP (last 3 results) No results for input(s): PROBNP in the last 8760 hours. HbA1C: No results for input(s): HGBA1C in the last 72 hours. CBG: No results for input(s): GLUCAP in the last 168 hours. Lipid Profile: No results for input(s): CHOL, HDL, LDLCALC, TRIG, CHOLHDL, LDLDIRECT in the last  72 hours. Thyroid Function Tests: No results for input(s): TSH, T4TOTAL, FREET4, T3FREE, THYROIDAB in the last 72 hours. Anemia Panel: No results for input(s): VITAMINB12, FOLATE, FERRITIN, TIBC, IRON, RETICCTPCT in the last 72 hours. Urine analysis: No results found for: COLORURINE, APPEARANCEUR, LABSPEC, PHURINE, GLUCOSEU, HGBUR, BILIRUBINUR, KETONESUR, PROTEINUR, UROBILINOGEN, NITRITE, LEUKOCYTESUR  Creatinine Clearance: Estimated Creatinine Clearance: 77.5 mL/min (by C-G formula based on SCr of 0.78 mg/dL).  Sepsis Labs: @LABRCNTIP (procalcitonin:4,lacticidven:4) )No results found for this or any previous visit (from the past 240 hour(s)).   Radiological Exams on Admission: Dg Chest 2 View  Result Date: 02/17/2017 CLINICAL DATA:  Short of breath EXAM: CHEST  2 VIEW COMPARISON:  05/22/2013 FINDINGS: Patchy opacities of developed at the right lung base. There is subsegmental atelectasis at the left base. Linear interface over the left upper lung zone laterally likely represents a skin fold. Emphysema is noted. IMPRESSION: Findings are worrisome for patchy right lower lobe pneumonia Followup PA and lateral chest X-ray is recommended in 3-4 weeks following trial of antibiotic therapy to ensure resolution and exclude underlying malignancy. Electronically Signed   By: Marybelle Killings M.D.   On: 02/17/2017 17:11     EKG: Independently reviewed.  NSR with rate 76; RBBB and LAFB with no obvious evidence of acute ischemia  Assessment/Plan Principal Problem:   Acute on chronic respiratory failure with hypoxia and hypercapnia (HCC) Active Problems:   Elevated PSA   COPD exacerbation (HCC)   Community acquired pneumonia   Conjunctivitis   Acute on chronic respiratory failure  -CO2 42 -WBC 5.7 -Patient presenting with hypoxia to the 70s -Given cough, decreased oxygen saturation, and infiltrate in right lower lobe on chest x-ray , most likely community-acquired pneumonia.  -He does also have COPD with marked bronchoconstriction and so a superimposed COPD exacerbation seems likely. -CURB-65 score is 1; mortality rate is estimated to be 2.1% - will admit the patient to Med Surg. -Initial plan (prior to abnormal CXR) was for doxycyline to also cover conjunctivitis (see below), will add Rocephin -Fever control -Repeat CBC in am -Sputum cultures -Blood cultures -Strep pneumo testing -Will order non-ICU procalcitonin algorithm.  >0.5 indicates infection and >>0.5 indicates more serious disease.  As the procalcitonin level normalizes, it will be reasonable to consider de-escalation of antibiotic coverage. -albuterol PRN -Standing Duonebs -Solu-Medrol 60 mg IV BID -Chronic respiratory failure is related to COPD and pulmonary HTN.  - He has had worsening oxygenation and has been reluctant to seek expert consultation. -Will request pulm consult tomorrow. -Also, his CXR was abnormal and in review of chart he does not appear to have ever had low-dose CT for lung cancer screening.   -Suggest f/u CT after this infection resolves - unless the patient would not pursue treatment (as with his prostate issue). -Continue home O2 at 3L. -Continue nightly CPAP.  Conjunctivitis -Often mixed respiratory infection and conjunctivitis are associated with H. Flu infection. -Will cover with doxycycline, as  above.  Elevated PSA -Patient has declined further evaluation despite concern for prostate CA   DVT prophylaxis: Lovenox Code Status:  Full - confirmed with patient Family Communication: None present  Disposition Plan:  Home once clinically improved Consults called: Pulmonology; CM, SW, nutrition, PT, OT, RT Admission status: Admit - It is my clinical opinion that admission to INPATIENT is reasonable and necessary because this patient will require at least 2 midnights in the hospital to treat this condition based on the medical complexity of the problems presented.  Given the aforementioned information,  the predictability of an adverse outcome is felt to be significant.     Karmen Bongo MD [facility]  If 7PM-7AM, please contact night-coverage [email or web address] Password [email or web address]  02/17/2017, 8:16 PM

## 2017-02-17 NOTE — ED Notes (Signed)
Patient called to use restroom. Assisted patient with help from family. Patient had wet pants refused to take them off til he gets to his room. Patient short winded when returning to room. Placed patient in bed. states that he is not having any pain at this time.

## 2017-02-17 NOTE — ED Triage Notes (Signed)
Pt reports that he has increased SOB for the past 2 days. He has a productive cough. Wears chronic O2 at 3 L and sats 78%. O2 increased to 4 L/via [place] in triage and remain 78%

## 2017-02-17 NOTE — ED Notes (Signed)
Pt placed on non rebreather with 10 L O2

## 2017-02-17 NOTE — ED Notes (Signed)
Patient is resting comfortably. 

## 2017-02-17 NOTE — ED Notes (Signed)
Attempted to call report. Was advised nurse receiving pt will call this nurse back for report. 

## 2017-02-17 NOTE — ED Provider Notes (Signed)
[facility] DEPT Provider Note   CSN: [id number] Arrival date & time: 02/17/17  1535  By signing my name below, I, Reola Mosher, attest that this documentation has been prepared under the direction and in the presence of Virgel Manifold, MD. Electronically Signed: Reola Mosher, ED Scribe. 02/17/17. 4:31 PM.  History   Chief Complaint Chief Complaint  Patient presents with  . Shortness of Breath   The history is provided by the patient and medical records. No language interpreter was used.    HPI Comments: Kyle Mosley is a 82 y.o. male with a PMHx of asthma, COPD, HTN/HLD, and pulmonary HTN, who presents to the Emergency Department complaining of persistent, gradually worsening shortness of breath beginning two days ago. Per pt, his shortness of breath has been acutely worse over the past two days, however, today he had a new onset of generalized weakness as well which prompted him to come into the ED. He also reports that he has had a mild productive cough which has been an ongoing issue for him. Per prior chart review, pt was last seen by his PCP for this on 12/28/16 (approx. 2 months ago) and at that time his home O2 requirement was upped to 3L/min. At that time, pt was recommended to f/u w/ a Pulmonologist d/t his decreasing O2 saturations while on home oxygen therapy; however, he has not followed up with a specialist. Since the worsening of his shortness of breath he has also been using nebulizing treatments and his albuterol inhaler at home without significant control of his shortness of breath. He is also prescribed Advair and Ventolin at home to take, but notes that he is currently out of this. Pt is also currently on a steroid taper which has also not been improving his symptoms. He denies chest pain, abdominal pain, fever, leg swelling, hematochezia, melena, or any other associated symptoms.   PCP: Tula Nakayama, MD  Past Medical History:  Diagnosis Date  .  Asthma   . Colon polyps   . COPD (chronic obstructive pulmonary disease) ([place])   . Diverticulosis   . Enlarged prostate    with elevated PSA  . Hyperlipidemia   . Hypertension   . Prediabetes 2011  . Pulmonary hypertension 2012   Patient Active Problem List   Diagnosis Date Noted  . Hypoxia 12/28/2016  . Loss of weight 05/27/2016  . Elevated PSA 03/26/2015  . Reduced vision 05/27/2014  . Repeated falls 09/08/2013  . Pulmonary HTN 06/07/2013  . COPD (chronic obstructive pulmonary disease) ([place]) 05/08/2013  . Primary generalized (osteo)arthritis 01/01/2013  . IGT (impaired glucose tolerance) 02/15/2012  . Bronchial asthma 09/30/2009  . FATIGUE 06/23/2009  . POLYP, COLON 04/08/2008  . Hyperlipidemia LDL goal <100 04/08/2008   Past Surgical History:  Procedure Laterality Date  . COLONOSCOPY  03/21/2007   Dr. Jonny Ruiz diverticula, normal rectum  . COLONOSCOPY  05/04/2012   Procedure: COLONOSCOPY;  Surgeon: Daneil Dolin, MD;  Location: [facility];  Service: Endoscopy;  Laterality: N/A;  7:30  . PROSTATE BIOPSY      Home Medications    Prior to Admission medications   Medication Sig Start Date End Date Taking? Authorizing Provider  albuterol (PROVENTIL) (2.5 MG/3ML) 0.083% nebulizer solution USE ONE VIAL IN NEBULIZER TWO TO THREE TIMES A DAY. 01/13/17   Fayrene Helper, MD  albuterol (VENTOLIN HFA) 108 (90 Base) MCG/ACT inhaler Inhale 2 puffs into the lungs every 6 (six) hours as needed for wheezing or shortness  of breath. 12/28/16   Fayrene Helper, MD  aspirin EC 81 MG tablet Take 81 mg by mouth daily.    Historical Provider, MD  ergocalciferol (VITAMIN D2) 50000 units capsule Take 1 capsule (50,000 Units total) by mouth once a week. One capsule once weekly 01/13/17   Fayrene Helper, MD  Fluticasone-Salmeterol (ADVAIR DISKUS) 500-50 MCG/DOSE AEPB Inhale 1 puff into the lungs 2 (two) times daily. 09/15/16   Fayrene Helper, MD  hydrochlorothiazide  (HYDRODIURIL) 25 MG tablet Take 1 tablet (25 mg total) by mouth daily. 01/13/17   Fayrene Helper, MD  ipratropium (ATROVENT) 0.02 % nebulizer solution USE ONE VIAL IN NEBULIZER TWO TO THREE TIMES A DAY. 01/13/17   Fayrene Helper, MD  lovastatin (MEVACOR) 40 MG tablet Take 1 tablet (40 mg total) by mouth at bedtime. 01/13/17   Fayrene Helper, MD  mirtazapine (REMERON) 7.5 MG tablet Take 1 tablet (7.5 mg total) by mouth at bedtime. 01/13/17   Fayrene Helper, MD  predniSONE (DELTASONE) 5 MG tablet TAKE 1 TABLET(5 MG) BY MOUTH DAILY 01/13/17   Fayrene Helper, MD  ranitidine (ZANTAC) 150 MG tablet TAKE 1 TABLET(150 MG) BY MOUTH DAILY 01/13/17   Fayrene Helper, MD   Family History Family History  Problem Relation Age of Onset  . Heart attack Father   . Alcohol abuse Mother   . Hypertension Brother   . COPD Sister    Social History Social History  Substance Use Topics  . Smoking status: Former Smoker    Packs/day: 1.00    Years: 16.00    Quit date: 11/24/1996  . Smokeless tobacco: Never Used  . Alcohol use No   Allergies   Patient has no known allergies.  Review of Systems Review of Systems  Constitutional: Negative for fever.  Respiratory: Positive for cough and shortness of breath.   Cardiovascular: Negative for chest pain and leg swelling.  Gastrointestinal: Negative for abdominal pain and blood in stool.  Neurological: Positive for weakness.  All other systems reviewed and are negative.  Physical Exam Updated Vital Signs BP 137/85 (BP Location: Left Arm)   Pulse 83   Temp 98.6 F (37 C) (Oral)   Resp 20   Ht 6' (1.829 m)   Wt 164 lb (74.4 kg)   SpO2 90%   BMI 22.24 kg/m   Physical Exam  Constitutional: He appears well-developed and well-nourished.  HENT:  Head: Normocephalic.  Right Ear: External ear normal.  Left Ear: External ear normal.  Nose: Nose normal.  Eyes: Conjunctivae are normal. Right eye exhibits no discharge. Left eye exhibits no  discharge.  Neck: Normal range of motion.  Cardiovascular: Normal rate, regular rhythm and normal heart sounds.   No murmur heard. Pulmonary/Chest: Effort normal and breath sounds normal. Tachypnea noted. No respiratory distress. He has no wheezes. He has no rales.  There is tachypnea. He has a prolonged expiratory phase and bilateral wheezing.   Abdominal: Soft. There is no tenderness. There is no rebound and no guarding.  Musculoskeletal: Normal range of motion. He exhibits edema. He exhibits no tenderness.  Mild symmetric pitting lower extremity edema.   Neurological: He is alert. No cranial nerve deficit. Coordination normal.  Skin: Skin is warm and dry. No rash noted. No erythema. No pallor.  Psychiatric: He has a normal mood and affect. His behavior is normal.  Nursing note and vitals reviewed.  ED Treatments / Results  DIAGNOSTIC STUDIES: Oxygen Saturation is 90% on  4L via , low by my interpretation.   COORDINATION OF CARE: 4:21 PM-Discussed next steps with pt. Pt verbalized understanding and is agreeable with the plan.   Labs (all labs ordered are listed, but only abnormal results are displayed) Labs Reviewed - No data to display  EKG  EKG Interpretation None      Radiology No results found.  Procedures Procedures   Medications Ordered in ED Medications - No data to display  Initial Impression / Assessment and Plan / ED Course  I have reviewed the triage vital signs and the nursing notes.  Pertinent labs & imaging results that were available during my care of the patient were reviewed by me and considered in my medical decision making (see chart for details).     82 year old male with dyspnea. Chest x-ray with right-sided infiltrate. We'll treat for acute acquired pneumonia on underlying COPD.   Final Clinical Impressions(s) / ED Diagnoses   Final diagnoses:  COPD exacerbation ([place])  Community acquired pneumonia, unspecified laterality   New  Prescriptions New Prescriptions   No medications on file   I personally preformed the services scribed in my presence. The recorded information has been reviewed is accurate. Virgel Manifold, MD.     Virgel Manifold, MD 02/21/17 [phone]

## 2017-02-17 NOTE — Progress Notes (Signed)
Pharmacy Note:  Antibiotics for Rocephin ordered by MD for CAP.  Estimated Creatinine Clearance: 77.5 mL/min (by C-G formula based on SCr of 0.78 mg/dL).   No Known Allergies  Vitals:   02/17/17 1830 02/17/17 1919  BP: (!) 146/80 (!) 143/83  Pulse: 78 82  Resp:  20  Temp:  98.1 F (36.7 C)    Anti-infectives    Start     Dose/Rate Route Frequency Ordered Stop   02/17/17 2100  cefTRIAXone (ROCEPHIN) 1 g in dextrose 5 % 50 mL IVPB     1 g 100 mL/hr over 30 Minutes Intravenous Every 24 hours 02/17/17 2039        Plan: Rocephin 1gm IV every 24 hours. Dose stable, sign off.  Pricilla Larsson, [facility] 02/17/2017 8:41 PM

## 2017-02-17 NOTE — Progress Notes (Signed)
Patient unable to tolerated CPAP machine. Tried patient on AutoBIPAP mode and regular CPAP mode at the minimal settings but patient states he cannot tolerate it. Placed patient back on 3 lpm nasal cannula. Circuit left in room but machine taken out.

## 2017-02-18 DIAGNOSIS — R0609 Other forms of dyspnea: Secondary | ICD-10-CM

## 2017-02-18 DIAGNOSIS — J9621 Acute and chronic respiratory failure with hypoxia: Secondary | ICD-10-CM

## 2017-02-18 DIAGNOSIS — R0989 Other specified symptoms and signs involving the circulatory and respiratory systems: Secondary | ICD-10-CM

## 2017-02-18 DIAGNOSIS — R0902 Hypoxemia: Secondary | ICD-10-CM

## 2017-02-18 DIAGNOSIS — R7302 Impaired glucose tolerance (oral): Secondary | ICD-10-CM

## 2017-02-18 DIAGNOSIS — J441 Chronic obstructive pulmonary disease with (acute) exacerbation: Principal | ICD-10-CM

## 2017-02-18 DIAGNOSIS — J9620 Acute and chronic respiratory failure, unspecified whether with hypoxia or hypercapnia: Secondary | ICD-10-CM

## 2017-02-18 LAB — BASIC METABOLIC PANEL
Anion gap: 5 (ref 5–15)
BUN: 16 mg/dL (ref 6–20)
CALCIUM: 8.7 mg/dL — AB (ref 8.9–10.3)
CO2: 42 mmol/L — AB (ref 22–32)
CREATININE: 0.63 mg/dL (ref 0.61–1.24)
Chloride: 91 mmol/L — ABNORMAL LOW (ref 101–111)
GFR calc Af Amer: >60 mL/min (ref >60)
Glucose, Bld: 136 mg/dL — ABNORMAL HIGH (ref 65–99)
Potassium: 4.2 mmol/L (ref 3.5–5.1)
SODIUM: 138 mmol/L (ref 135–145)

## 2017-02-18 LAB — CBC
HCT: 48 % (ref 39.0–52.0)
Hemoglobin: 14.8 g/dL (ref 13.0–17.0)
MCH: 29.8 pg (ref 26.0–34.0)
MCHC: 30.8 g/dL (ref 30.0–36.0)
MCV: 96.8 fL (ref 78.0–100.0)
PLATELETS: 217 10*3/uL (ref 150–400)
RBC: 4.96 MIL/uL (ref 4.22–5.81)
RDW: 13.2 % (ref 11.5–15.5)
WBC: 4.2 10*3/uL (ref 4.0–10.5)

## 2017-02-18 LAB — GLUCOSE, CAPILLARY
GLUCOSE-CAPILLARY: 141 mg/dL — AB (ref 65–99)
GLUCOSE-CAPILLARY: 128 mg/dL — AB (ref 65–99)
GLUCOSE-CAPILLARY: 129 mg/dL — AB (ref 65–99)

## 2017-02-18 LAB — STREP PNEUMONIAE URINARY ANTIGEN: STREP PNEUMO URINARY ANTIGEN: NEGATIVE

## 2017-02-18 MED ORDER — ENSURE ENLIVE PO LIQD
237.0000 mL | Freq: Two times a day (BID) | ORAL | Status: DC
  Administered 2017-02-18 – 2017-02-19 (×4): 237 mL via ORAL

## 2017-02-18 MED ORDER — METHYLPREDNISOLONE SODIUM SUCC 125 MG IJ SOLR
60.0000 mg | Freq: Three times a day (TID) | INTRAMUSCULAR | Status: AC
  Administered 2017-02-18 – 2017-02-19 (×5): 60 mg via INTRAVENOUS
  Filled 2017-02-18 (×5): qty 2

## 2017-02-18 MED ORDER — INSULIN ASPART 100 UNIT/ML ~~LOC~~ SOLN
0.0000 [IU] | Freq: Three times a day (TID) | SUBCUTANEOUS | Status: DC
Start: 2017-02-18 — End: 2017-02-19
  Administered 2017-02-18 – 2017-02-19 (×5): 1 [IU] via SUBCUTANEOUS

## 2017-02-18 MED ORDER — BUDESONIDE 0.5 MG/2ML IN SUSP
0.5000 mg | Freq: Two times a day (BID) | RESPIRATORY_TRACT | Status: DC
  Administered 2017-02-18 – 2017-02-20 (×4): 0.5 mg via RESPIRATORY_TRACT
  Filled 2017-02-18 (×4): qty 2

## 2017-02-18 MED ORDER — AZITHROMYCIN 250 MG PO TABS
500.0000 mg | ORAL_TABLET | Freq: Every day | ORAL | Status: DC
  Administered 2017-02-18 – 2017-02-20 (×3): 500 mg via ORAL
  Filled 2017-02-18 (×3): qty 2

## 2017-02-18 MED ORDER — ADULT MULTIVITAMIN W/MINERALS CH
1.0000 | ORAL_TABLET | Freq: Every day | ORAL | Status: DC
  Administered 2017-02-18 – 2017-02-20 (×3): 1 via ORAL
  Filled 2017-02-18 (×3): qty 1

## 2017-02-18 NOTE — Progress Notes (Signed)
PROGRESS NOTE  Kyle Mosley:[id number] DOB: 1935-10-09 DOA: 02/17/2017 PCP: Tula Nakayama, MD  Brief History:  82 year old male with a history of hypertension, COPD, chronic respiratory failure on 3 L, so on CPAP, hyperlipidemia, and impaired glucose tolerance presenting with 3 day history of worsening shortness of breath and nonproductive cough. The patient denies any fevers, chills, chest pain, nausea, vomiting, diarrhea, headache, sore throat. The patient's wife provided supplemental history. She states that the patient has not been using his albuterol/atv nebulizer as directed. Instead of using one vial of medication 3 times daily, the patient has only been using 1/3 of a vial TID. In addition, the patient states that he ran out of his Ventolin and Advair approximately 2-3 days prior to this admission. The patient's home oxygen was increased from 2 L to 3 L during his office visit on 12/28/2016. The patient last saw pulmonary medicine, Dr. Luan Pulling, a little over one year ago, but has yet to follow back up despite instructions from his primary care provider. In the emergency department, the patient was noted to have oxygen saturation 78% on 3 L. The patient was treated with bronchodilators and intravenous steroids. Chest x-ray showed worsening of right lower lobe opacity.  Assessment/Plan: Acute on chronic respiratory failure with hypoxia -Secondary to COPD exacerbation -Wean oxygen back to his baseline as tolerated -Pulmonary hygiene  COPD exacerbation -Continue intravenous steroids--increase to q 8 hours -Continue duo nebs -And Pulmicort -Flutter valve  Abnormal chest x-ray -clinical presentation not consistent with pneumonia -no fever or leukocytosis -procalcitonin <0.10 -d/c ceftriaxone -start azithromycin  Hypertension -holding HCTZ and monitor clinically -BP acceptable  Hyperlipidemia -Continue statin  Impaired glucose tolerance -Hemoglobin A1c -NovoLog  sliding scale -Anticipate elevated CBGs secondary to steroids  Deconditioning -PT eval     Disposition Plan:   Home in 1-2 days  Family Communication:   Spouse updated at bedside 3/30  Consultants:  none  Code Status:  FULL   DVT Prophylaxis:  [place] Lovenox   Procedures: As Listed in Progress Note Above  Antibiotics: Ceftriaxone 3/29 Doxy 3/29 Azithromycin 3/30>>>    Subjective: Patient has been better but still remained short of breath. Denies any fevers, chills, chest pain, nausea, vomiting, diarrhea, vomiting. No dysuria or hematuria. No rashes.  Objective: Vitals:   02/17/17 2251 02/17/17 2348 02/18/17 0604 02/18/17 0804  BP:   138/80   Pulse:   88   Resp:   16   Temp:   98.2 F (36.8 C)   TempSrc:   Oral   SpO2:  94% 94% 97%  Weight: 73.3 kg (161 lb 9.6 oz)     Height: 6' (1.829 m)       Intake/Output Summary (Last 24 hours) at 02/18/17 0855 Last data filed at 02/18/17 0605  Gross per 24 hour  Intake               50 ml  Output              750 ml  Net             -700 ml   Weight change:  Exam:   General:  Pt is alert, follows commands appropriately, not in acute distress  HEENT: No icterus, No thrush, No neck mass, [place]/AT  Cardiovascular: RRR, S1/S2, no rubs, no gallops  Respiratory: Bilateral rhonchi. Mild basilar wheezing. Good air movement.  Abdomen: Soft/+BS, non tender, non distended, no guarding  Extremities:  1 + LE edema, No lymphangitis, No petechiae, No rashes, no synovitis   Data Reviewed: I have personally reviewed following labs and imaging studies Basic Metabolic Panel:  Recent Labs Lab 02/17/17 1710 02/18/17 0501  NA 136 138  K 4.4 4.2  CL 90* 91*  CO2 42* 42*  GLUCOSE 90 136*  BUN 19 16  CREATININE 0.78 0.63  CALCIUM 8.8* 8.7*   Liver Function Tests: No results for input(s): AST, ALT, ALKPHOS, BILITOT, PROT, ALBUMIN in the last 168 hours. No results for input(s): LIPASE, AMYLASE in the last 168 hours. No  results for input(s): AMMONIA in the last 168 hours. Coagulation Profile: No results for input(s): INR, PROTIME in the last 168 hours. CBC:  Recent Labs Lab 02/17/17 1623 02/18/17 0501  WBC 5.7 4.2  NEUTROABS 4.9  --   HGB 15.4 14.8  HCT 49.9 48.0  MCV 97.7 96.8  PLT 230 217   Cardiac Enzymes: No results for input(s): CKTOTAL, CKMB, CKMBINDEX, TROPONINI in the last 168 hours. BNP: Invalid input(s): POCBNP CBG: No results for input(s): GLUCAP in the last 168 hours. HbA1C: No results for input(s): HGBA1C in the last 72 hours. Urine analysis: No results found for: COLORURINE, APPEARANCEUR, LABSPEC, PHURINE, GLUCOSEU, HGBUR, BILIRUBINUR, KETONESUR, PROTEINUR, UROBILINOGEN, NITRITE, LEUKOCYTESUR Sepsis Labs: @LABRCNTIP (procalcitonin:4,lacticidven:4) ) Recent Results (from the past 240 hour(s))  Culture, blood (routine x 2)     Status: None (Preliminary result)   Collection Time: 02/17/17  8:43 PM  Result Value Ref Range Status   Specimen Description BLOOD RIGHT FOREARM  Final   Special Requests BOTTLES DRAWN AEROBIC AND ANAEROBIC [place]  Final   Culture PENDING  Incomplete   Report Status PENDING  Incomplete  Culture, blood (routine x 2)     Status: None (Preliminary result)   Collection Time: 02/17/17  8:55 PM  Result Value Ref Range Status   Specimen Description LEFT ANTECUBITAL  Final   Special Requests BOTTLES DRAWN AEROBIC AND ANAEROBIC [place]  Final   Culture PENDING  Incomplete   Report Status PENDING  Incomplete     Scheduled Meds: . aspirin EC  81 mg Oral Daily  . cefTRIAXone (ROCEPHIN)  IV  1 g Intravenous Q24H  . docusate sodium  100 mg Oral BID  . doxycycline  100 mg Oral Q12H  . enoxaparin (LOVENOX) injection  40 mg Subcutaneous Q24H  . famotidine  20 mg Oral Daily  . hydrochlorothiazide  25 mg Oral Daily  . ipratropium-albuterol  3 mL Nebulization TID  . methylPREDNISolone (SOLU-MEDROL) injection  60 mg Intravenous Q12H  . mirtazapine  7.5 mg Oral QHS  .  pravastatin  40 mg Oral q1800   Continuous Infusions:  Procedures/Studies: Dg Chest 2 View  Result Date: 02/17/2017 CLINICAL DATA:  Short of breath EXAM: CHEST  2 VIEW COMPARISON:  05/22/2013 FINDINGS: Patchy opacities of developed at the right lung base. There is subsegmental atelectasis at the left base. Linear interface over the left upper lung zone laterally likely represents a skin fold. Emphysema is noted. IMPRESSION: Findings are worrisome for patchy right lower lobe pneumonia Followup PA and lateral chest X-ray is recommended in 3-4 weeks following trial of antibiotic therapy to ensure resolution and exclude underlying malignancy. Electronically Signed   By: Marybelle Killings M.D.   On: 02/17/2017 17:11    Marylan Glore, DO  [facility] Pager [phone]  If 7PM-7AM, please contact night-coverage [email or web address] Password [email or web address] 02/18/2017, 8:55 AM   LOS: 1 day

## 2017-02-18 NOTE — Care Management Important Message (Signed)
Important Message  Patient Details  Name: Kyle Mosley MRN: [id number] Date of Birth: 08-May-1935   Medicare Important Message Given:  Yes    Elier Zellars, [name], RN 02/18/2017, 12:40 PM

## 2017-02-18 NOTE — Evaluation (Addendum)
Occupational Therapy Evaluation Patient Details Name: Kyle Mosley MRN: [id number] DOB: [birth year] Today's Date: 02/18/2017    History of Present Illness Kyle Mosley is a 82 y.o. male with medical history significant of plumonary HTN, HTN, HLD, BPH, and COPD on 3L home O2/CPAP qhs presenting because "I got stopped up and it got worser and worser.  I thought I could fight it off but the medicine ran out" - albuterol inhaler and Advair, too early to fill.  +SOB all there time.  3L home O2.  CPAP at night - uncertain setting.  +cough, minimally productive, whitish sputum like a dry mouth.  No fevers.  No known sick contacts.   Clinical Impression   Patient was agreeable to participate in OT evaluation. Patient presents with BUE weakness and SOB with activity. Recommend patient to receive HHOT at home to focus on UB strengthening and endurance and be further educated on energy conservation techniques.     Follow Up Recommendations  Home health OT    Equipment Recommendations  None recommended by OT       Precautions / Restrictions Precautions Precautions: None Precaution Comments: pt reports one fall a year ago. None since then.       Mobility Bed Mobility Overal bed mobility: Independent                Transfers Overall transfer level: Modified independent Equipment used: Rolling walker (2 wheeled)                      ADL either performed or assessed with clinical judgement   ADL                                         General ADL Comments: Patient complete LBD of donning/doffing hospital socks while seated on EOB with Supervision and increased time. Education for breathing techniques to conserve energy. Pt then transfered to recliner using RW at [place].      Vision Baseline Vision/History: No visual deficits Patient Visual Report: No change from baseline              Pertinent Vitals/Pain Pain Assessment: No/denies pain     Hand  Dominance Right   Extremity/Trunk Assessment Upper Extremity Assessment Upper Extremity Assessment: Generalized weakness;LUE deficits/detail;RUE deficits/detail RUE Deficits / Details: Shoulder strength: 4/5 for flexion, abduction, IR/er. functional gross grasp. LUE Deficits / Details: Shoulder strength: 3+/5 flexion, 4-/5 abduction and IR/er. Decreased functional grasp.            Communication Communication Communication: No difficulties   Cognition Arousal/Alertness: Awake/alert Behavior During Therapy: WFL for tasks assessed/performed Overall Cognitive Status: Within Functional Limits for tasks assessed                                                Home Living Family/patient expects to be discharged to:: Private residence Living Arrangements: Spouse/significant other   Type of Home: House Home Access: Stairs to enter [employer] of Steps: 5  Entrance Stairs-Rails: Right Home Layout: One level     Bathroom Shower/Tub: Tub/shower unit;Walk-in shower (patient uses both)   Biochemist, clinical: Standard     Home Equipment: Shower seat;Grab bars - tub/shower;Wheelchair - manual   Additional Comments: Wife uses a  cane and walker. She has mobility difficulties. Wife completes cooking one meal a day and completes cleaning tasks. Patient reports that he'll prepare a simple meal/snack for himself for the remaining meals.       Prior Functioning/Environment Level of Independence: Independent                          OT Goals(Current goals can be found in the care plan section) Acute Rehab OT Goals Patient Stated Goal: To go home  OT Frequency:      End of Session Equipment Utilized During Treatment: Rolling walker  Activity Tolerance: Patient tolerated treatment well Patient left: in chair;with call bell/phone within reach;with nursing/sitter in room  OT Visit Diagnosis: Muscle weakness (generalized) (M62.81)            OT  Assessment  OT Recommendation/Assessment All further OT needs can be met in the next venue of care           Time: 2703-5009 OT Time Calculation (min): 22 min Charges:    G-Codes: OT G-codes **NOT FOR INPATIENT CLASS** Functional Assessment Tool Used: AM-PAC 6 Clicks Daily Activity Functional Limitation: Self care Self Care Current Status (F8182): At least 20 percent but less than 40 percent impaired, limited or restricted Self Care Goal Status (X9371): At least 20 percent but less than 40 percent impaired, limited or restricted Self Care Discharge Status [phone]): At least 20 percent but less than 40 percent impaired, limited or restricted   Ailene Ravel, OTR/L,CBIS  [phone]   Louise Rawson, Clarene Duke 02/18/2017, 1:41 PM

## 2017-02-18 NOTE — Care Management Note (Signed)
Case Management Note  Patient Details  Name: Kyle Mosley MRN: [id number] Date of Birth: December 12, 1934  Subjective/Objective:  Adm with Acute on chronic respiratory failure with hypoxia and hypercapnia. He is ind with ADL's. Has home O2 provided by [employer] 2-3L, CPAP and neb machine. He has PCP (Dr. Tula Mosley) still drives to appointments and reports no issues affording medications. CM discussed Home health and patient states he does want an Therapist, sports or PT. He states he gets around fine. He says his wife can help him if needed and that they have dogs in the house and may be difficult for home health staff to come in.         Action/Plan: Anticipate DC home with self care.   Expected Discharge Date:       02/19/2017           Expected Discharge Plan:  Home/Self Care  In-House Referral:     Discharge planning Services  CM Consult  Post Acute Care Choice:    Choice offered to:     DME Arranged:    DME Agency:     HH Arranged:    HH Agency:     Status of Service:  In process, will continue to follow  If discussed at Long Length of Stay Meetings, dates discussed:    Additional Comments:  Kyle Mosley, Kyle Reading, RN 02/18/2017, 12:31 PM

## 2017-02-18 NOTE — Progress Notes (Signed)
LCSW aware of consult for COPD Gold Protocol Patient does not meet criteria for screening due to having only 1 admission in last 6 months.  COPD gold patients have had 3 or more admissions to hospital in last 6 months.  Will be available if needs arise during hospitalization, please re-consult if needed.  Kielan Dreisbach LCSW, MSW Clinical Social Work: System Wide Float Coverage for :  [phone] 

## 2017-02-18 NOTE — Evaluation (Signed)
Physical Therapy Evaluation Patient Details Name: Kyle Mosley MRN: [id number] DOB: [birth year] Today's Date: 02/18/2017   History of Present Illness  Kyle Mosley is a 82 y.o. male with medical history significant of plumonary HTN, HTN, HLD, BPH, and COPD on 3L home O2/CPAP qhs presenting because "I got stopped up and it got worser and worser.  I thought I could fight it off but the medicine ran out" - albuterol inhaler and Advair, too early to fill.  +SOB all there time.  3L home O2.  CPAP at night - uncertain setting.  +cough, minimally productive, whitish sputum like a dry mouth.  No fevers.  No known sick contacts.  Clinical Impression  Pt states he normally does not use an assistive device but on occasion will use a cane.  Therapist recommended to pt that he ambulates with a cane.  Pt appears to be at prior functional level.  No skilled physical therapy recommended. Pt will benefit from nursing staff ambulating with pt in the halls while he is in the hospital to keep activity level up.     Follow Up Recommendations No PT follow up    Equipment Recommendations   (PT has a cane at home. )    Recommendations for Other Services       Precautions / Restrictions Precautions Precautions: None Precaution Comments: pt reports one fall a year ago. None since then.  Restrictions Weight Bearing Restrictions: No      Mobility  Bed Mobility Overal bed mobility: Modified Independent                Transfers Overall transfer level: Modified independent Equipment used: cane                 Ambulation/Gait Ambulation/Gait assistance: Modified independent (Device/Increase time) Ambulation Distance (Feet): 60 Feet Assistive device: Straight cane Gait Pattern/deviations: Trunk flexed   Gait velocity interpretation: Below normal speed for age/gender               Pertinent Vitals/Pain Pain Assessment: No/denies pain    Home Living Family/patient expects to be  discharged to:: Private residence Living Arrangements: Spouse/significant other Available Help at Discharge: Family Type of Home: House Home Access: Stairs to enter Entrance Stairs-Rails: Right Entrance Stairs-Number of Steps: 5 Home Layout: One level Home Equipment: Cane - single point;Walker - 2 wheels Additional Comments: Wife uses a cane and walker. She has mobility difficulties. Wife completes cooking one meal a day and completes cleaning tasks. Patient reports that he'll prepare a simple meal/snack for himself for the remaining meals.     Prior Function Level of Independence: Independent               Hand Dominance   Dominant Hand: Right    Extremity/Trunk Assessment   Upper Extremity Assessment Upper Extremity Assessment: Generalized weakness;LUE deficits/detail;RUE deficits/detail RUE Deficits / Details: Shoulder strength: 4/5 for flexion, abduction, IR/er. functional gross grasp. LUE Deficits / Details: Shoulder strength: 3+/5 flexion, 4-/5 abduction and IR/er. Decreased functional grasp.     Lower Extremity Assessment Lower Extremity Assessment: Overall WFL for tasks assessed       Communication   Communication: No difficulties  Cognition Arousal/Alertness: Awake/alert Behavior During Therapy: WFL for tasks assessed/performed Overall Cognitive Status: Within Functional Limits for tasks assessed  Assessment/Plan    PT Assessment Patent does not need any further PT services         PT Goals (Current goals can be found in the Care Plan section)  Acute Rehab PT Goals Patient Stated Goal: To go home            End of Session Equipment Utilized During Treatment: Gait belt Activity Tolerance: Patient tolerated treatment well Patient left: in bed   PT Visit Diagnosis: Muscle weakness (generalized) (M62.81)    Time: 4932-4199 PT Time Calculation (min) (ACUTE ONLY): 23  min   Charges:   PT Evaluation $PT Eval Low Complexity: 1 Procedure     PT G Codes:   PT G-Codes **NOT FOR INPATIENT CLASS** Functional Assessment Tool Used: AM-PAC 6 Clicks Basic Mobility Functional Limitation: Mobility: Walking and moving around Mobility: Walking and Moving Around Current Status (V4445): At least 1 percent but less than 20 percent impaired, limited or restricted Mobility: Walking and Moving Around Goal Status [phone]): At least 1 percent but less than 20 percent impaired, limited or restricted Mobility: Walking and Moving Around Discharge Status [phone]): At least 1 percent but less than 20 percent impaired, limited or restricted     Rayetta Humphrey, PT CLT [phone] 02/18/2017, 4:24 PM

## 2017-02-18 NOTE — Progress Notes (Signed)
Initial Nutrition Assessment  DOCUMENTATION CODES:   Severe malnutrition in context of chronic illness  INTERVENTION:  Education needs addressed (Sources of protein discussed as well as ways to increase caloric intake)  Ensure Enlive po BID, each supplement provides 350 kcal and 20 grams of protein   Multivitamin daily   NUTRITION DIAGNOSIS:   Malnutrition (Severe) related to catabolic illness (COPD) as evidenced by severe depletion of body fat, severe depletion of muscle mass.   GOAL:   Patient will meet greater than or equal to 90% of their needs   MONITOR:   Supplement acceptance, PO intake, Labs, Weight trends  REASON FOR ASSESSMENT:   Consult Assessment of nutrition requirement/status  ASSESSMENT: Kyle Mosley is an 82 yo gentleman with hx of COPD and O2 dependent (3L) at home. His wife is here with him and helped with hx.   His home diet is regular and his usual intake is cereal (cheerios) with milk, a light sandwich or snack for lunch and she cooks dinner every day. Kyle Mosley comments she can't understand why he is losing weight because he had not changed his eating habits. However, RD explained that his wt loss may be  based on respiratory disease progression and that with his increasing O2 dependence that his body's increased metabolic demand and that just eating the same amount of food would automatically contribute to a chronic deficit and would result in unplanned wt loss. His wt is down 7% since January which is trending toward significant and he has severe muscle and fat depletions as noted below.      Recent Labs Lab 02/17/17 1710 02/18/17 0501  NA 136 138  K 4.4 4.2  CL 90* 91*  CO2 42* 42*  BUN 19 16  CREATININE 0.78 0.63  CALCIUM 8.8* 8.7*  GLUCOSE 90 136*   Labs and meds: reviewed. Remeron for appetite.  Nutrition-Focused physical exam completed. Findings are severe fat depletions (thoracic and upper arms), severe muscle depletion (temporal, clavicle,  acromion regions) , and mild BLE edema.    Diet Order:  Diet regular Room service appropriate? Yes; Fluid consistency: Thin  Skin:  Dry, scaly and multiple bruises   Last BM:  3/29  Height:   Ht Readings from Last 1 Encounters:  02/17/17 6' (1.829 m)    Weight:   Wt Readings from Last 1 Encounters:  02/17/17 161 lb 9.6 oz (73.3 kg)    Ideal Body Weight:  81 kg  BMI:  Body mass index is 21.92 kg/m.  Estimated Nutritional Needs:   Kcal:  2044-2190   Protein:  88-95 gr  Fluid:  >1.8 liters daily  EDUCATION NEEDS:   Education needs addressed (Sources of protein discussed as well as ways to increase caloric intake )- using heavy cream, powdered milk, adding healthy fats.  Colman Cater Kyle,RD,CSG,LDN Office: [phone] Pager: [phone]

## 2017-02-19 DIAGNOSIS — E43 Unspecified severe protein-calorie malnutrition: Secondary | ICD-10-CM | POA: Insufficient documentation

## 2017-02-19 LAB — GLUCOSE, CAPILLARY
GLUCOSE-CAPILLARY: 131 mg/dL — AB (ref 65–99)
GLUCOSE-CAPILLARY: 135 mg/dL — AB (ref 65–99)
Glucose-Capillary: 121 mg/dL — ABNORMAL HIGH (ref 65–99)

## 2017-02-19 LAB — HEMOGLOBIN A1C
HEMOGLOBIN A1C: 5.8 % — AB (ref 4.8–5.6)
MEAN PLASMA GLUCOSE: 120 mg/dL

## 2017-02-19 LAB — PROCALCITONIN: Procalcitonin: <0.1 ng/mL

## 2017-02-19 MED ORDER — PREDNISONE 20 MG PO TABS
60.0000 mg | ORAL_TABLET | Freq: Every day | ORAL | Status: DC
  Administered 2017-02-20: 60 mg via ORAL
  Filled 2017-02-19: qty 3

## 2017-02-19 MED ORDER — PREDNISONE 10 MG PO TABS: 60.0000 mg | ORAL_TABLET | Freq: Every day | ORAL | 0 refills | Status: DC

## 2017-02-19 MED ORDER — PREDNISONE 5 MG PO TABS: ORAL_TABLET | ORAL | 0 refills | Status: DC

## 2017-02-19 MED ORDER — AZITHROMYCIN 500 MG PO TABS: 500.0000 mg | ORAL_TABLET | Freq: Every day | ORAL | 0 refills | Status: DC

## 2017-02-19 MED ORDER — ENSURE ENLIVE PO LIQD: 237.0000 mL | Freq: Two times a day (BID) | ORAL | 0 refills | Status: DC

## 2017-02-19 NOTE — Progress Notes (Signed)
Patient had uneventful night.   Patient slept all night with no issues or complaints of pain.

## 2017-02-19 NOTE — Discharge Summary (Addendum)
Physician Discharge Summary  Kyle Mosley XTK:[id number] DOB: November 16, 1935 DOA: 02/17/2017  PCP: Tula Nakayama, MD  Admit date: 02/17/2017 Discharge date:  02/20/17  Admitted From: Home Disposition:  Home  Recommendations for Outpatient Follow-up:  1. Follow up with PCP in 1-2 weeks 2. Please obtain BMP/CBC in one week    Discharge Condition: Stable CODE STATUS: FULL Diet recommendation: Heart Healthy   Brief/Interim Summary: 82 year old male with a history of hypertension, COPD, chronic respiratory failure on 3 L, so on CPAP, hyperlipidemia, and impaired glucose tolerance presenting with 3 day history of worsening shortness of breath and nonproductive cough. The patient denies any fevers, chills, chest pain, nausea, vomiting, diarrhea, headache, sore throat. The patient's wife provided supplemental history. She states that the patient has not been using his albuterol/atvnebulizer as directed. Instead of using one vial of medication 3 times daily, the patient has only been using 1/3 of a vial TID.In addition, the patient states that he ran out of his Ventolin and Advair approximately 2-3 days prior to this admission. The patient's home oxygen was increased from 2 L to 3 L during his office visit on 12/28/2016. The patient last saw pulmonary medicine, Dr. Luan Pulling, a little over one year ago, but has yet to follow back up despite instructions from his primary care provider. In the emergency department, the patient was noted to have oxygen saturation 78% on 3 L. The patient was treated with bronchodilators and intravenous steroids with clinical improvement. Chest x-ray showed worsening of right lower lobe opacity, but clinically did not have pneumonia.  Discharge Diagnoses:  Acute on chronic respiratory failure with hypoxia -Secondary to COPD exacerbation -Weaned oxygen back to his baseline as tolerated -on 3 L at home -Pulmonary hygiene  COPD exacerbation -Continue intravenous  steroids--increase to q 8 hours-->transition to po steroids 4/1 -Continue duo nebs -Added Pulmicort during hospitalization -Flutter valve -home with prednisone taper  Abnormal chest x-ray -clinical presentation not consistent with pneumonia -no fever or leukocytosis -procalcitonin <0.10 -d/c ceftriaxone -continue azithromycin--2 more days after d/c to complete 5 days of therapy  Hypertension -holding HCTZ and monitor clinically -BP acceptable throughout hospitalization -will not restart after d/c -follow up with PCP    Hyperlipidemia -Continue statin  Impaired glucose tolerance -Hemoglobin A1c--5.8 -NovoLog sliding scale -Anticipate elevated CBGs secondary to steroids  Deconditioning -PT eval-->no PT follow up  Severe Malnutrition -continue supplements    Discharge Instructions  Discharge Instructions    Diet - low sodium heart healthy    Complete by:  As directed    Increase activity slowly    Complete by:  As directed      Allergies as of 02/20/2017   No Known Allergies     Medication List    STOP taking these medications   hydrochlorothiazide 25 MG tablet Commonly known as:  HYDRODIURIL     TAKE these medications   albuterol 108 (90 Base) MCG/ACT inhaler Commonly known as:  VENTOLIN HFA Inhale 2 puffs into the lungs every 6 (six) hours as needed for wheezing or shortness of breath.   albuterol (2.5 MG/3ML) 0.083% nebulizer solution Commonly known as:  PROVENTIL USE ONE VIAL IN NEBULIZER TWO TO THREE TIMES A DAY.   aspirin EC 81 MG tablet Take 81 mg by mouth daily.   azithromycin 500 MG tablet Commonly known as:  ZITHROMAX Take 1 tablet (500 mg total) by mouth daily. Start taking on:  02/21/2017   ergocalciferol 50000 units capsule Commonly known as:  VITAMIN D2 Take  1 capsule (50,000 Units total) by mouth once a week. One capsule once weekly   feeding supplement (ENSURE ENLIVE) Liqd Take 237 mLs by mouth 2 (two) times daily between  meals.   Fluticasone-Salmeterol 500-50 MCG/DOSE Aepb Commonly known as:  ADVAIR DISKUS Inhale 1 puff into the lungs 2 (two) times daily.   ipratropium 0.02 % nebulizer solution Commonly known as:  ATROVENT USE ONE VIAL IN NEBULIZER TWO TO THREE TIMES A DAY.   lovastatin 40 MG tablet Commonly known as:  MEVACOR Take 1 tablet (40 mg total) by mouth at bedtime. What changed:  when to take this   mirtazapine 7.5 MG tablet Commonly known as:  REMERON Take 1 tablet (7.5 mg total) by mouth at bedtime.   predniSONE 5 MG tablet Commonly known as:  DELTASONE TAKE 1 TABLET(5 MG) BY MOUTH DAILY--resume after finished with prednisone taper What changed:  additional instructions   predniSONE 10 MG tablet Commonly known as:  DELTASONE Take 6 tablets (60 mg total) by mouth daily with breakfast. And decrease by 1 tablet daily Start taking on:  02/21/2017 What changed:  You were already taking a medication with the same name, and this prescription was added. Make sure you understand how and when to take each.   ranitidine 150 MG tablet Commonly known as:  ZANTAC TAKE 1 TABLET(150 MG) BY MOUTH DAILY       No Known Allergies  Consultations:  none   Procedures/Studies: Dg Chest 2 View  Result Date: 02/17/2017 CLINICAL DATA:  Short of breath EXAM: CHEST  2 VIEW COMPARISON:  05/22/2013 FINDINGS: Patchy opacities of developed at the right lung base. There is subsegmental atelectasis at the left base. Linear interface over the left upper lung zone laterally likely represents a skin fold. Emphysema is noted. IMPRESSION: Findings are worrisome for patchy right lower lobe pneumonia Followup PA and lateral chest X-ray is recommended in 3-4 weeks following trial of antibiotic therapy to ensure resolution and exclude underlying malignancy. Electronically Signed   By: Marybelle Killings M.D.   On: 02/17/2017 17:11        Discharge Exam: Vitals:   02/19/17 2047 02/20/17 0545  BP:  124/78  Pulse: 70  74  Resp: 16 18  Temp:  98.9 F (37.2 C)   Vitals:   02/19/17 2046 02/19/17 2047 02/20/17 0545 02/20/17 0721  BP:   124/78   Pulse:  70 74   Resp:  16 18   Temp:   98.9 F (37.2 C)   TempSrc:   Oral   SpO2: 94% 94% 95% 96%  Weight:      Height:        General: Pt is alert, awake, not in acute distress Cardiovascular: RRR, S1/S2 +, no rubs, no gallops Respiratory: CTA bilaterally, no wheezing, no rhonchi Abdominal: Soft, NT, ND, bowel sounds + Extremities: no edema, no cyanosis   The results of significant diagnostics from this hospitalization (including imaging, microbiology, ancillary and laboratory) are listed below for reference.    Significant Diagnostic Studies: Dg Chest 2 View  Result Date: 02/17/2017 CLINICAL DATA:  Short of breath EXAM: CHEST  2 VIEW COMPARISON:  05/22/2013 FINDINGS: Patchy opacities of developed at the right lung base. There is subsegmental atelectasis at the left base. Linear interface over the left upper lung zone laterally likely represents a skin fold. Emphysema is noted. IMPRESSION: Findings are worrisome for patchy right lower lobe pneumonia Followup PA and lateral chest X-ray is recommended in 3-4 weeks following trial  of antibiotic therapy to ensure resolution and exclude underlying malignancy. Electronically Signed   By: Marybelle Killings M.D.   On: 02/17/2017 17:11     Microbiology: Recent Results (from the past 240 hour(s))  Culture, blood (routine x 2)     Status: None (Preliminary result)   Collection Time: 02/17/17  8:43 PM  Result Value Ref Range Status   Specimen Description BLOOD RIGHT FOREARM  Final   Special Requests BOTTLES DRAWN AEROBIC AND ANAEROBIC 6CC  Final   Culture NO GROWTH 3 DAYS  Final   Report Status PENDING  Incomplete  Culture, blood (routine x 2)     Status: None (Preliminary result)   Collection Time: 02/17/17  8:55 PM  Result Value Ref Range Status   Specimen Description LEFT ANTECUBITAL  Final   Special Requests  BOTTLES DRAWN AEROBIC AND ANAEROBIC 6CC  Final   Culture NO GROWTH 3 DAYS  Final   Report Status PENDING  Incomplete     Labs: Basic Metabolic Panel:  Recent Labs Lab 02/17/17 1710 02/18/17 0501  NA 136 138  K 4.4 4.2  CL 90* 91*  CO2 42* 42*  GLUCOSE 90 136*  BUN 19 16  CREATININE 0.78 0.63  CALCIUM 8.8* 8.7*   Liver Function Tests: No results for input(s): AST, ALT, ALKPHOS, BILITOT, PROT, ALBUMIN in the last 168 hours. No results for input(s): LIPASE, AMYLASE in the last 168 hours. No results for input(s): AMMONIA in the last 168 hours. CBC:  Recent Labs Lab 02/17/17 1623 02/18/17 0501  WBC 5.7 4.2  NEUTROABS 4.9  --   HGB 15.4 14.8  HCT 49.9 48.0  MCV 97.7 96.8  PLT 230 217   Cardiac Enzymes: No results for input(s): CKTOTAL, CKMB, CKMBINDEX, TROPONINI in the last 168 hours. BNP: Invalid input(s): POCBNP CBG:  Recent Labs Lab 02/18/17 1608 02/18/17 2034 02/19/17 0744 02/19/17 1124 02/19/17 1620  GLUCAP 128* 129* 131* 135* 121*    Time coordinating discharge:  Greater than 30 minutes  Signed:  Mariellen Blaney, DO [facility] Pager: [phone] 02/20/2017, 9:48 AM

## 2017-02-19 NOTE — Progress Notes (Signed)
PROGRESS NOTE  Kyle SCIASCIA ZOX:[id number] DOB: 1935/06/12 DOA: 02/17/2017 PCP: Tula Nakayama, MD Brief History:  82 year old male with a history of hypertension, COPD, chronic respiratory failure on 3 L, so on CPAP, hyperlipidemia, and impaired glucose tolerance presenting with 3 day history of worsening shortness of breath and nonproductive cough. The patient denies any fevers, chills, chest pain, nausea, vomiting, diarrhea, headache, sore throat. The patient's wife provided supplemental history. She states that the patient has not been using his albuterol/atv nebulizer as directed. Instead of using one vial of medication 3 times daily, the patient has only been using 1/3 of a vial TID. In addition, the patient states that he ran out of his Ventolin and Advair approximately 2-3 days prior to this admission. The patient's home oxygen was increased from 2 L to 3 L during his office visit on 12/28/2016. The patient last saw pulmonary medicine, Dr. Luan Pulling, a little over one year ago, but has yet to follow back up despite instructions from his primary care provider. In the emergency department, the patient was noted to have oxygen saturation 78% on 3 L. The patient was treated with bronchodilators and intravenous steroids. Chest x-ray showed worsening of right lower lobe opacity.  Assessment/Plan: Acute on chronic respiratory failure with hypoxia -Secondary to COPD exacerbation -Wean oxygen back to his baseline as tolerated -on 3 L at home -Pulmonary hygiene  COPD exacerbation -Continue intravenous steroids--increase to q 8 hours-->transition to po steroids 4/1 -Continue duo nebs -Added Pulmicort -Flutter valve  Abnormal chest x-ray -clinical presentation not consistent with pneumonia -no fever or leukocytosis -procalcitonin <0.10 -d/c ceftriaxone -continue azithromycin  Hypertension -holding HCTZ and monitor clinically -BP acceptable  Hyperlipidemia -Continue  statin  Impaired glucose tolerance -Hemoglobin A1c--5.8 -NovoLog sliding scale -Anticipate elevated CBGs secondary to steroids  Deconditioning -PT eval-->no PT follow up  Severe Malnutrition -continue supplements     Disposition Plan:   Home 4/1 if stable Family Communication:   Spouse updated at bedside 3/31  Consultants:  none  Code Status:  FULL   DVT Prophylaxis:  [place] Lovenox   Procedures: As Listed in Progress Note Above  Antibiotics: Ceftriaxone 3/29 Doxy 3/29 Azithromycin 3/30>>>    Subjective: Patient denies fevers, chills, headache, chest pain, dyspnea, nausea, vomiting, diarrhea, abdominal pain, dysuria, hematuria, hematochezia, and melena.   Objective: Vitals:   02/18/17 2036 02/19/17 0552 02/19/17 0730 02/19/17 1331  BP: 118/61 125/70    Pulse: 81 69    Resp: 18 18    Temp: 98.6 F (37 C) 97.9 F (36.6 C)    TempSrc: Oral Oral    SpO2: 94% 93% 94% 90%  Weight:      Height:        Intake/Output Summary (Last 24 hours) at 02/19/17 1536 Last data filed at 02/19/17 0900  Gross per 24 hour  Intake              480 ml  Output              850 ml  Net             -370 ml   Weight change:  Exam:   General:  Pt is alert, follows commands appropriately, not in acute distress  HEENT: No icterus, No thrush, No neck mass, [place]/AT  Cardiovascular: RRR, S1/S2, no rubs, no gallops  Respiratory: Bibasilar rales. No wheezing. Good air movement.  Abdomen: Soft/+BS, non tender, non distended, no guarding  Extremities: No edema, No lymphangitis, No petechiae, No rashes, no synovitis   Data Reviewed: I have personally reviewed following labs and imaging studies Basic Metabolic Panel:  Recent Labs Lab 02/17/17 1710 02/18/17 0501  NA 136 138  K 4.4 4.2  CL 90* 91*  CO2 42* 42*  GLUCOSE 90 136*  BUN 19 16  CREATININE 0.78 0.63  CALCIUM 8.8* 8.7*   Liver Function Tests: No results for input(s): AST, ALT, ALKPHOS, BILITOT,  PROT, ALBUMIN in the last 168 hours. No results for input(s): LIPASE, AMYLASE in the last 168 hours. No results for input(s): AMMONIA in the last 168 hours. Coagulation Profile: No results for input(s): INR, PROTIME in the last 168 hours. CBC:  Recent Labs Lab 02/17/17 1623 02/18/17 0501  WBC 5.7 4.2  NEUTROABS 4.9  --   HGB 15.4 14.8  HCT 49.9 48.0  MCV 97.7 96.8  PLT 230 217   Cardiac Enzymes: No results for input(s): CKTOTAL, CKMB, CKMBINDEX, TROPONINI in the last 168 hours. BNP: Invalid input(s): POCBNP CBG:  Recent Labs Lab 02/18/17 1134 02/18/17 1608 02/18/17 2034 02/19/17 0744 02/19/17 1124  GLUCAP 141* 128* 129* 131* 135*   HbA1C:  Recent Labs  02/18/17 0501  HGBA1C 5.8*   Urine analysis: No results found for: COLORURINE, APPEARANCEUR, LABSPEC, PHURINE, GLUCOSEU, HGBUR, BILIRUBINUR, KETONESUR, PROTEINUR, UROBILINOGEN, NITRITE, LEUKOCYTESUR Sepsis Labs: @LABRCNTIP (procalcitonin:4,lacticidven:4) ) Recent Results (from the past 240 hour(s))  Culture, blood (routine x 2)     Status: None (Preliminary result)   Collection Time: 02/17/17  8:43 PM  Result Value Ref Range Status   Specimen Description BLOOD RIGHT FOREARM  Final   Special Requests BOTTLES DRAWN AEROBIC AND ANAEROBIC [place]  Final   Culture NO GROWTH 2 DAYS  Final   Report Status PENDING  Incomplete  Culture, blood (routine x 2)     Status: None (Preliminary result)   Collection Time: 02/17/17  8:55 PM  Result Value Ref Range Status   Specimen Description LEFT ANTECUBITAL  Final   Special Requests BOTTLES DRAWN AEROBIC AND ANAEROBIC 6CC  Final   Culture NO GROWTH 2 DAYS  Final   Report Status PENDING  Incomplete     Scheduled Meds: . aspirin EC  81 mg Oral Daily  . azithromycin  500 mg Oral Daily  . budesonide (PULMICORT) nebulizer solution  0.5 mg Nebulization BID  . docusate sodium  100 mg Oral BID  . enoxaparin (LOVENOX) injection  40 mg Subcutaneous Q24H  . famotidine  20 mg Oral  Daily  . feeding supplement (ENSURE ENLIVE)  237 mL Oral BID BM  . insulin aspart  0-9 Units Subcutaneous TID WC  . ipratropium-albuterol  3 mL Nebulization TID  . methylPREDNISolone (SOLU-MEDROL) injection  60 mg Intravenous Q8H  . mirtazapine  7.5 mg Oral QHS  . multivitamin with minerals  1 tablet Oral Daily  . pravastatin  40 mg Oral q1800   Continuous Infusions:  Procedures/Studies: Dg Chest 2 View  Result Date: 02/17/2017 CLINICAL DATA:  Short of breath EXAM: CHEST  2 VIEW COMPARISON:  05/22/2013 FINDINGS: Patchy opacities of developed at the right lung base. There is subsegmental atelectasis at the left base. Linear interface over the left upper lung zone laterally likely represents a skin fold. Emphysema is noted. IMPRESSION: Findings are worrisome for patchy right lower lobe pneumonia Followup PA and lateral chest X-ray is recommended in 3-4 weeks following trial of antibiotic therapy to ensure resolution and exclude underlying malignancy. Electronically Signed   By:  Marybelle Killings M.D.   On: 02/17/2017 17:11    Noya Santarelli, DO  [facility] Pager [phone]  If 7PM-7AM, please contact night-coverage [email or web address] Password [email or web address] 02/19/2017, 3:36 PM   LOS: 2 days

## 2017-02-20 NOTE — Progress Notes (Signed)
Pt's IV catheter removed and intact. Pt's IV site clean dry and intact. Discharge instructions including medications and follow up appointments were reviewed and discussed with patient's wife. All questions were answered and no further questions at this time. Pt's wife verbalized understanding of discharge instructions. Pt in stable condition and in no acute distress at time of discharge. Pt escorted by nurse tech.

## 2017-02-21 ENCOUNTER — Telehealth: Payer: Self-pay

## 2017-02-21 NOTE — Telephone Encounter (Signed)
Transition Care Management Follow-up Telephone Call   Date discharged? 02/20/2017   How have you been since you were released from the hospital? Patient's wife states yesterday he was doing really good and was a lot better. However, today she states he has went downhill and has increased confusion as well as increased weakness. No further coughing or wheezing noted per patient's wife. She is currently helping him with dressing, walking, as well as going to the bathroom.   Do you understand why you were in the hospital? yes   Do you understand the discharge instructions? yes   Where were you discharged to? Home   Items Reviewed:  Medications reviewed: yes  Allergies reviewed: yes  Dietary changes reviewed: yes  Referrals reviewed: yes   Functional Questionnaire:   Activities of Daily Living (ADLs):   He states they are independent in the following: bathing and hygiene, feeding, continence and grooming States they require assistance with the following: ambulation, toileting and dressing   Any transportation issues/concerns?: no   Any patient concerns? Yes, patient's wife is requesting in home care to assist patient with bathing, and dressing.     Confirmed importance and date/time of follow-up visits scheduled yes  Provider Appointment booked with Dr. Moshe Cipro on 02/28/2017 at 10:00 am  Confirmed with patient if condition begins to worsen call PCP or go to the ER.  Patient was given the office number and encouraged to call back with question or concerns.  : yes

## 2017-02-22 LAB — CULTURE, BLOOD (ROUTINE X 2)
CULTURE: NO GROWTH
Culture: NO GROWTH

## 2017-02-28 ENCOUNTER — Encounter: Payer: Self-pay | Admitting: Family Medicine

## 2017-02-28 ENCOUNTER — Ambulatory Visit (INDEPENDENT_AMBULATORY_CARE_PROVIDER_SITE_OTHER): Payer: Medicare HMO | Admitting: Family Medicine

## 2017-02-28 VITALS — BP 128/76 | HR 80 | Temp 98.9°F | Resp 20 | Ht 72.0 in | Wt 166.1 lb

## 2017-02-28 DIAGNOSIS — R9389 Abnormal findings on diagnostic imaging of other specified body structures: Secondary | ICD-10-CM

## 2017-02-28 DIAGNOSIS — R938 Abnormal findings on diagnostic imaging of other specified body structures: Secondary | ICD-10-CM

## 2017-02-28 DIAGNOSIS — E785 Hyperlipidemia, unspecified: Secondary | ICD-10-CM

## 2017-02-28 DIAGNOSIS — I272 Pulmonary hypertension, unspecified: Secondary | ICD-10-CM

## 2017-02-28 DIAGNOSIS — R296 Repeated falls: Secondary | ICD-10-CM | POA: Diagnosis not present

## 2017-02-28 DIAGNOSIS — Z125 Encounter for screening for malignant neoplasm of prostate: Secondary | ICD-10-CM

## 2017-02-28 DIAGNOSIS — Z09 Encounter for follow-up examination after completed treatment for conditions other than malignant neoplasm: Secondary | ICD-10-CM

## 2017-02-28 MED ORDER — FLUTICASONE-SALMETEROL 250-50 MCG/DOSE IN AEPB: 1.0000 | INHALATION_SPRAY | Freq: Two times a day (BID) | RESPIRATORY_TRACT | 11 refills | Status: DC

## 2017-02-28 NOTE — Patient Instructions (Addendum)
f/u in June as before, call if you need me sooner  Please get CXR first week in May  You are referred for in home pT twice weekly for 4 weeks, due to weakness and high fall risk  Fasting cholesterol, cmp and eGFR and pSA, cBC  And tSH  1 week before June visit   Careful not to fall!  Eat regularly , increased amounts and take ensure daily  Medications are being sent to mail order as you requested, 3 month supplies  Thank you  for choosing [facility]. We consider it a privelige to serve you.  Delivering excellent health care in a caring and  compassionate way is our goal.  Partnering with you,  so that together we can achieve this goal is our strategy.

## 2017-03-03 DIAGNOSIS — E785 Hyperlipidemia, unspecified: Secondary | ICD-10-CM | POA: Diagnosis not present

## 2017-03-03 DIAGNOSIS — J449 Chronic obstructive pulmonary disease, unspecified: Secondary | ICD-10-CM | POA: Diagnosis not present

## 2017-03-05 DIAGNOSIS — Z09 Encounter for follow-up examination after completed treatment for conditions other than malignant neoplasm: Secondary | ICD-10-CM | POA: Insufficient documentation

## 2017-03-05 NOTE — Assessment & Plan Note (Signed)
Improved since discharge, states his appetite is better and he is working on increased mobility Discharge summary reviewed with pt and his wife and all questions were answered Tests will be done in the near future

## 2017-03-05 NOTE — Assessment & Plan Note (Signed)
Poor balance weakness and recurrent fallnes, in home PT twice weekly for 4 weeks Home safety reviewed

## 2017-03-05 NOTE — Progress Notes (Signed)
   [name]     MRN: [id number]      DOB: 12-30-1934   HPI [name]  Patient in for follow up of recent hospitalization.from 3/29 to 04/01 for COPD exaccerbation Discharge summary, and laboratory and radiology data are reviewed, and any questions or concerns about recent hospitalization are discussed. Specific issues requiring follow up are specifically addressed.   ROS Reports improvement in breathing and in appetite Denies chest pains, palpitations and leg swelling Denies abdominal pain, nausea, vomiting,diarrhea or constipation.   Denies dysuria, frequency, hesitancy or incontinence. c/o weakness and limitation in mobility. Denies headaches, seizures, numbness, or tingling. Denies depression, anxiety or insomnia. Denies skin break down or rash.   PE  BP 128/76 (BP Location: Right Arm, Patient Position: Sitting, Cuff Size: Normal)   Pulse 80   Temp 98.9 F (37.2 C) (Temporal)   Resp 20   Ht 6' (1.829 m)   Wt 166 lb 1.3 oz (75.3 kg)   SpO2 94% Comment: o2 3 lpm via n/c  BMI 22.52 kg/m   Patient alert and oriented and in no cardiopulmonary distress.  HEENT: No facial asymmetry, EOMI,   oropharynx pink and moist.  Neck supple no JVD, no mass.  Chest: Clear to auscultation bilaterally.Decreased air entry throughout  CVS: S1, S2 no murmurs, no S3.Regular rate.  ABD: Soft non tender.   Ext: No edema  MS: Decreased  ROM spine, shoulders, hips and knees.  Skin: Intact, no ulcerations or rash noted.  Psych: Good eye contact, normal affect. Memory intact not anxious or depressed appearing.  CNS: CN 2-12 intact, power,  normal throughout.no focal deficits noted.   Assessment & Plan Repeated falls Poor balance weakness and recurrent fallnes, in home PT twice weekly for 4 weeks Home safety reviewed  Hospital discharge follow-up Improved since discharge, states his appetite is better and he is working on increased mobility Discharge summary reviewed with pt and  his wife and all questions were answered Tests will be done in the near future

## 2017-03-18 DIAGNOSIS — R296 Repeated falls: Secondary | ICD-10-CM | POA: Diagnosis not present

## 2017-03-18 DIAGNOSIS — Z9981 Dependence on supplemental oxygen: Secondary | ICD-10-CM | POA: Diagnosis not present

## 2017-03-18 DIAGNOSIS — J441 Chronic obstructive pulmonary disease with (acute) exacerbation: Secondary | ICD-10-CM | POA: Diagnosis not present

## 2017-03-18 DIAGNOSIS — Z7952 Long term (current) use of systemic steroids: Secondary | ICD-10-CM | POA: Diagnosis not present

## 2017-03-18 DIAGNOSIS — Z87891 Personal history of nicotine dependence: Secondary | ICD-10-CM | POA: Diagnosis not present

## 2017-03-18 DIAGNOSIS — I272 Pulmonary hypertension, unspecified: Secondary | ICD-10-CM | POA: Diagnosis not present

## 2017-03-22 DIAGNOSIS — I272 Pulmonary hypertension, unspecified: Secondary | ICD-10-CM | POA: Diagnosis not present

## 2017-03-22 DIAGNOSIS — R296 Repeated falls: Secondary | ICD-10-CM | POA: Diagnosis not present

## 2017-03-22 DIAGNOSIS — J441 Chronic obstructive pulmonary disease with (acute) exacerbation: Secondary | ICD-10-CM | POA: Diagnosis not present

## 2017-03-22 DIAGNOSIS — Z9981 Dependence on supplemental oxygen: Secondary | ICD-10-CM | POA: Diagnosis not present

## 2017-03-22 DIAGNOSIS — Z7952 Long term (current) use of systemic steroids: Secondary | ICD-10-CM | POA: Diagnosis not present

## 2017-03-22 DIAGNOSIS — Z87891 Personal history of nicotine dependence: Secondary | ICD-10-CM | POA: Diagnosis not present

## 2017-03-25 ENCOUNTER — Other Ambulatory Visit: Payer: Self-pay | Admitting: Family Medicine

## 2017-03-25 DIAGNOSIS — R296 Repeated falls: Secondary | ICD-10-CM | POA: Diagnosis not present

## 2017-03-25 DIAGNOSIS — Z7952 Long term (current) use of systemic steroids: Secondary | ICD-10-CM | POA: Diagnosis not present

## 2017-03-25 DIAGNOSIS — Z9981 Dependence on supplemental oxygen: Secondary | ICD-10-CM | POA: Diagnosis not present

## 2017-03-25 DIAGNOSIS — J441 Chronic obstructive pulmonary disease with (acute) exacerbation: Secondary | ICD-10-CM | POA: Diagnosis not present

## 2017-03-25 DIAGNOSIS — I272 Pulmonary hypertension, unspecified: Secondary | ICD-10-CM | POA: Diagnosis not present

## 2017-03-25 DIAGNOSIS — Z87891 Personal history of nicotine dependence: Secondary | ICD-10-CM | POA: Diagnosis not present

## 2017-03-29 ENCOUNTER — Ambulatory Visit (HOSPITAL_COMMUNITY)
Admission: RE | Admit: 2017-03-29 | Discharge: 2017-03-29 | Disposition: A | Discharge status: Home / Self Care | Payer: Medicare HMO | Source: Ambulatory Visit | Attending: Family Medicine | Admitting: Family Medicine

## 2017-03-29 ENCOUNTER — Encounter: Payer: Self-pay | Admitting: Internal Medicine

## 2017-03-29 DIAGNOSIS — I272 Pulmonary hypertension, unspecified: Secondary | ICD-10-CM | POA: Diagnosis not present

## 2017-03-29 DIAGNOSIS — R938 Abnormal findings on diagnostic imaging of other specified body structures: Secondary | ICD-10-CM | POA: Diagnosis not present

## 2017-03-29 DIAGNOSIS — Z9981 Dependence on supplemental oxygen: Secondary | ICD-10-CM | POA: Diagnosis not present

## 2017-03-29 DIAGNOSIS — R05 Cough: Secondary | ICD-10-CM | POA: Diagnosis not present

## 2017-03-29 DIAGNOSIS — Z7952 Long term (current) use of systemic steroids: Secondary | ICD-10-CM | POA: Diagnosis not present

## 2017-03-29 DIAGNOSIS — R0602 Shortness of breath: Secondary | ICD-10-CM | POA: Diagnosis not present

## 2017-03-29 DIAGNOSIS — R918 Other nonspecific abnormal finding of lung field: Secondary | ICD-10-CM | POA: Diagnosis not present

## 2017-03-29 DIAGNOSIS — E785 Hyperlipidemia, unspecified: Secondary | ICD-10-CM | POA: Diagnosis not present

## 2017-03-29 DIAGNOSIS — Z125 Encounter for screening for malignant neoplasm of prostate: Secondary | ICD-10-CM | POA: Diagnosis not present

## 2017-03-29 DIAGNOSIS — R9389 Abnormal findings on diagnostic imaging of other specified body structures: Secondary | ICD-10-CM

## 2017-03-29 DIAGNOSIS — J441 Chronic obstructive pulmonary disease with (acute) exacerbation: Secondary | ICD-10-CM | POA: Diagnosis not present

## 2017-03-29 DIAGNOSIS — Z87891 Personal history of nicotine dependence: Secondary | ICD-10-CM | POA: Diagnosis not present

## 2017-03-29 DIAGNOSIS — R296 Repeated falls: Secondary | ICD-10-CM | POA: Diagnosis not present

## 2017-03-29 LAB — COMPLETE METABOLIC PANEL WITH GFR
ALT: 9 U/L (ref 9–46)
AST: 16 U/L (ref 10–35)
Albumin: 3.3 g/dL — ABNORMAL LOW (ref 3.6–5.1)
Alkaline Phosphatase: 61 U/L (ref 40–115)
BUN: 18 mg/dL (ref 7–25)
CHLORIDE: 100 mmol/L (ref 98–110)
CO2: 37 mmol/L — AB (ref 20–31)
Calcium: 8.8 mg/dL (ref 8.6–10.3)
Creat: 0.77 mg/dL (ref 0.70–1.11)
GFR, EST NON AFRICAN AMERICAN: 86 mL/min (ref >=60)
Glucose, Bld: 86 mg/dL (ref 65–99)
POTASSIUM: 4.4 mmol/L (ref 3.5–5.3)
Sodium: 143 mmol/L (ref 135–146)
Total Bilirubin: 0.6 mg/dL (ref 0.2–1.2)
Total Protein: 6.4 g/dL (ref 6.1–8.1)

## 2017-03-29 LAB — LIPID PANEL
CHOL/HDL RATIO: 1.6 ratio (ref <5.0)
Cholesterol: 154 mg/dL (ref <200)
HDL: 99 mg/dL (ref >40)
LDL CALC: 46 mg/dL (ref <100)
Triglycerides: 44 mg/dL (ref <150)
VLDL: 9 mg/dL (ref <30)

## 2017-03-29 LAB — TSH: TSH: 2.19 m[IU]/L (ref 0.40–4.50)

## 2017-03-29 LAB — CBC
HCT: 48.9 % (ref 38.5–50.0)
HEMOGLOBIN: 15.7 g/dL (ref 13.2–17.1)
MCH: 30 pg (ref 27.0–33.0)
MCHC: 32.1 g/dL (ref 32.0–36.0)
MCV: 93.3 fL (ref 80.0–100.0)
MPV: 11 fL (ref 7.5–12.5)
Platelets: 234 10*3/uL (ref 140–400)
RBC: 5.24 MIL/uL (ref 4.20–5.80)
RDW: 14.4 % (ref 11.0–15.0)
WBC: 6.4 10*3/uL (ref 3.8–10.8)

## 2017-03-30 LAB — PSA: PSA: 6.4 ng/mL — ABNORMAL HIGH (ref <=4.0)

## 2017-03-31 DIAGNOSIS — Z7952 Long term (current) use of systemic steroids: Secondary | ICD-10-CM | POA: Diagnosis not present

## 2017-03-31 DIAGNOSIS — I272 Pulmonary hypertension, unspecified: Secondary | ICD-10-CM | POA: Diagnosis not present

## 2017-03-31 DIAGNOSIS — Z9981 Dependence on supplemental oxygen: Secondary | ICD-10-CM | POA: Diagnosis not present

## 2017-03-31 DIAGNOSIS — R296 Repeated falls: Secondary | ICD-10-CM | POA: Diagnosis not present

## 2017-03-31 DIAGNOSIS — J441 Chronic obstructive pulmonary disease with (acute) exacerbation: Secondary | ICD-10-CM | POA: Diagnosis not present

## 2017-03-31 DIAGNOSIS — Z87891 Personal history of nicotine dependence: Secondary | ICD-10-CM | POA: Diagnosis not present

## 2017-04-02 DIAGNOSIS — E785 Hyperlipidemia, unspecified: Secondary | ICD-10-CM | POA: Diagnosis not present

## 2017-04-02 DIAGNOSIS — J449 Chronic obstructive pulmonary disease, unspecified: Secondary | ICD-10-CM | POA: Diagnosis not present

## 2017-04-05 DIAGNOSIS — I272 Pulmonary hypertension, unspecified: Secondary | ICD-10-CM | POA: Diagnosis not present

## 2017-04-05 DIAGNOSIS — Z7952 Long term (current) use of systemic steroids: Secondary | ICD-10-CM | POA: Diagnosis not present

## 2017-04-05 DIAGNOSIS — R296 Repeated falls: Secondary | ICD-10-CM | POA: Diagnosis not present

## 2017-04-05 DIAGNOSIS — J441 Chronic obstructive pulmonary disease with (acute) exacerbation: Secondary | ICD-10-CM | POA: Diagnosis not present

## 2017-04-05 DIAGNOSIS — Z87891 Personal history of nicotine dependence: Secondary | ICD-10-CM | POA: Diagnosis not present

## 2017-04-05 DIAGNOSIS — Z9981 Dependence on supplemental oxygen: Secondary | ICD-10-CM | POA: Diagnosis not present

## 2017-04-06 ENCOUNTER — Telehealth: Payer: Self-pay

## 2017-04-06 NOTE — Telephone Encounter (Signed)
States his BP has started going back up again. Wife states nurse said it was 140/82 yesterday. He was taken off HCTZ 25 qd at the hospital. She wants to know if you want him to go back on it (they already have the med). Unable to come in for BP check today. Please advise

## 2017-04-06 NOTE — Telephone Encounter (Signed)
Patient aware.

## 2017-04-06 NOTE — Telephone Encounter (Signed)
Message left that sharvil' bp is elevated, wanting to know if he should go back on bp med?

## 2017-04-06 NOTE — Telephone Encounter (Signed)
If 140/82 at home would not re start bP med at this time

## 2017-04-07 DIAGNOSIS — Z9981 Dependence on supplemental oxygen: Secondary | ICD-10-CM | POA: Diagnosis not present

## 2017-04-07 DIAGNOSIS — R296 Repeated falls: Secondary | ICD-10-CM | POA: Diagnosis not present

## 2017-04-07 DIAGNOSIS — Z7952 Long term (current) use of systemic steroids: Secondary | ICD-10-CM | POA: Diagnosis not present

## 2017-04-07 DIAGNOSIS — Z87891 Personal history of nicotine dependence: Secondary | ICD-10-CM | POA: Diagnosis not present

## 2017-04-07 DIAGNOSIS — I272 Pulmonary hypertension, unspecified: Secondary | ICD-10-CM | POA: Diagnosis not present

## 2017-04-07 DIAGNOSIS — J441 Chronic obstructive pulmonary disease with (acute) exacerbation: Secondary | ICD-10-CM | POA: Diagnosis not present

## 2017-04-12 DIAGNOSIS — Z7952 Long term (current) use of systemic steroids: Secondary | ICD-10-CM | POA: Diagnosis not present

## 2017-04-12 DIAGNOSIS — Z87891 Personal history of nicotine dependence: Secondary | ICD-10-CM | POA: Diagnosis not present

## 2017-04-12 DIAGNOSIS — Z9981 Dependence on supplemental oxygen: Secondary | ICD-10-CM | POA: Diagnosis not present

## 2017-04-12 DIAGNOSIS — I272 Pulmonary hypertension, unspecified: Secondary | ICD-10-CM | POA: Diagnosis not present

## 2017-04-12 DIAGNOSIS — R296 Repeated falls: Secondary | ICD-10-CM | POA: Diagnosis not present

## 2017-04-12 DIAGNOSIS — J441 Chronic obstructive pulmonary disease with (acute) exacerbation: Secondary | ICD-10-CM | POA: Diagnosis not present

## 2017-04-14 DIAGNOSIS — Z7952 Long term (current) use of systemic steroids: Secondary | ICD-10-CM | POA: Diagnosis not present

## 2017-04-14 DIAGNOSIS — I272 Pulmonary hypertension, unspecified: Secondary | ICD-10-CM | POA: Diagnosis not present

## 2017-04-14 DIAGNOSIS — Z9981 Dependence on supplemental oxygen: Secondary | ICD-10-CM | POA: Diagnosis not present

## 2017-04-14 DIAGNOSIS — J441 Chronic obstructive pulmonary disease with (acute) exacerbation: Secondary | ICD-10-CM | POA: Diagnosis not present

## 2017-04-14 DIAGNOSIS — R296 Repeated falls: Secondary | ICD-10-CM | POA: Diagnosis not present

## 2017-04-14 DIAGNOSIS — Z87891 Personal history of nicotine dependence: Secondary | ICD-10-CM | POA: Diagnosis not present

## 2017-05-02 ENCOUNTER — Other Ambulatory Visit: Payer: Self-pay | Admitting: Family Medicine

## 2017-05-02 ENCOUNTER — Encounter: Payer: Self-pay | Admitting: Family Medicine

## 2017-05-02 ENCOUNTER — Ambulatory Visit (INDEPENDENT_AMBULATORY_CARE_PROVIDER_SITE_OTHER): Payer: Medicare HMO | Admitting: Family Medicine

## 2017-05-02 VITALS — BP 140/80 | HR 72 | Resp 16 | Ht 72.0 in | Wt 163.0 lb

## 2017-05-02 DIAGNOSIS — F329 Major depressive disorder, single episode, unspecified: Secondary | ICD-10-CM

## 2017-05-02 DIAGNOSIS — F32A Depression, unspecified: Secondary | ICD-10-CM

## 2017-05-02 DIAGNOSIS — J9621 Acute and chronic respiratory failure with hypoxia: Secondary | ICD-10-CM

## 2017-05-02 DIAGNOSIS — I272 Pulmonary hypertension, unspecified: Secondary | ICD-10-CM | POA: Diagnosis not present

## 2017-05-02 DIAGNOSIS — E785 Hyperlipidemia, unspecified: Secondary | ICD-10-CM | POA: Diagnosis not present

## 2017-05-02 DIAGNOSIS — J438 Other emphysema: Secondary | ICD-10-CM | POA: Diagnosis not present

## 2017-05-02 DIAGNOSIS — J441 Chronic obstructive pulmonary disease with (acute) exacerbation: Secondary | ICD-10-CM | POA: Diagnosis not present

## 2017-05-02 DIAGNOSIS — R972 Elevated prostate specific antigen [PSA]: Secondary | ICD-10-CM | POA: Diagnosis not present

## 2017-05-02 MED ORDER — METHYLPREDNISOLONE ACETATE 80 MG/ML IJ SUSP
80.0000 mg | Freq: Once | INTRAMUSCULAR | Status: AC
  Administered 2017-05-02: 80 mg via INTRAMUSCULAR

## 2017-05-02 MED ORDER — ALBUTEROL SULFATE (2.5 MG/3ML) 0.083% IN NEBU
2.5000 mg | INHALATION_SOLUTION | Freq: Once | RESPIRATORY_TRACT | Status: AC
  Administered 2017-05-02: 2.5 mg via RESPIRATORY_TRACT

## 2017-05-02 MED ORDER — IPRATROPIUM BROMIDE 0.02 % IN SOLN
0.5000 mg | Freq: Once | RESPIRATORY_TRACT | Status: AC
  Administered 2017-05-02: 0.5 mg via RESPIRATORY_TRACT

## 2017-05-02 MED ORDER — PREDNISONE 5 MG (21) PO TBPK: 5.0000 mg | ORAL_TABLET | ORAL | 0 refills | Status: DC

## 2017-05-02 NOTE — Assessment & Plan Note (Signed)
Progressively worsening

## 2017-05-02 NOTE — Patient Instructions (Signed)
F/u in 4 month, call if you need me sooner  You will get breathing treatment and dep omedrol injection in office today  Take 6 day course of prednisone sent in as your breathing  Is not good today  You need to use your breathing machine 3 times daily at home  We are trying o get your rescue inhaler at a lower cost to you  Your breathing is your big problem  Your cXR and rcent labs were good  Thank you  for choosing [facility]. We consider it a privelige to serve you.  Delivering excellent health care in a caring and  compassionate way is our goal.  Partnering with you,  so that together we can achieve this goal is our strategy.

## 2017-05-02 NOTE — Progress Notes (Signed)
   EMMITTE SURGEON     MRN: [id number]      DOB: [birth year]   HPI Mr. Starlin is here for follow up and re-evaluation of chronic medical conditions, medication management and review of any available recent lab and radiology data.  Preventive health is updated, specifically  Cancer screening and Immunization.   Increased shortness of breath and low oxygen noted , however , wife reports not using neb treatments as recommended, and states he cannot afford the albuterol MDI, cost is $70 The PT denies any adverse reactions to current medications since the last visit.  There are no new concerns.  There are no specific complaints  Reports a good appetite  ROS Denies recent fever or chills. Denies sinus pressure, nasal congestion, ear pain or sore throat. . Denies chest pains, palpitations and leg swelling Denies abdominal pain, nausea, vomiting,diarrhea or constipation.   Denies dysuria, frequency, hesitancy or incontinence. Denies joint pain, swelling and limitation in mobility. Denies headaches, seizures, numbness, or tingling. Denies depression, anxiety or insomnia. Denies skin break down or rash.   PE  BP 140/80   Pulse 72   Resp 16   Ht 6' (1.829 m)   Wt 163 lb (73.9 kg)   SpO2 (!) 86%   BMI 22.11 kg/m   Patient alert and oriented and in no cardiopulmonary distress.  HEENT: No facial asymmetry, EOMI,   oropharynx pink and moist.  Neck supple no JVD, no mass.  Chest: Clear to auscultation bilaterally.  CVS: S1, S2 no murmurs, no S3.Regular rate.  ABD: Soft non tender.   Ext: No edema  MS: Adequate though reduced   ROM spine, shoulders, hips and knees.  Skin: Intact, no ulcerations or rash noted.  Psych: Good eye contact, normal affect. Memory intact not anxious or depressed appearing.  CNS: CN 2-12 intact, power,  normal throughout.no focal deficits noted.   Assessment & Plan  COPD exacerbation (HCC) Neb x 1 and depo medrol 80 mg im and pred dose pack  Acute on  chronic respiratory failure with hypoxia (HCC) Neb treatment and depo medrol 80 mg iM in office with prednisone dose pack . Will follow through on cost of albuterol MDI for the patient CXR shows large bulla, he has severe cOPD and pulmonary hypertension Post neb treatment  Did not improve his oxygen unfortunately lung reserve is severely chanllenged  COPD (chronic obstructive pulmonary disease) (HCC) Progressively worsening  Elevated PSA Most recent PSA in 2018, is lower than it was 2 years ago which is actually a rlief  Hyperlipidemia LDL goal <100 Controlled, no change in medication Hyperlipidemia:Low fat diet discussed and encouraged.   Lipid Panel  Lab Results  Component Value Date   CHOL 154 03/29/2017   HDL 99 03/29/2017   LDLCALC 46 03/29/2017   TRIG 44 03/29/2017   CHOLHDL 1.6 03/29/2017       Pulmonary HTN Progressively worsening as pt hypoxic on 2 liters of oxygen despite neb treatment

## 2017-05-02 NOTE — Assessment & Plan Note (Signed)
Progressively worsening as pt hypoxic on 2 liters of oxygen despite neb treatment

## 2017-05-02 NOTE — Assessment & Plan Note (Signed)
Most recent PSA in 2018, is lower than it was 2 years ago which is actually a rlief

## 2017-05-02 NOTE — Assessment & Plan Note (Signed)
Neb treatment and depo medrol 80 mg iM in office with prednisone dose pack . Will follow through on cost of albuterol MDI for the patient CXR shows large bulla, he has severe cOPD and pulmonary hypertension Post neb treatment  Did not improve his oxygen unfortunately lung reserve is severely chanllenged

## 2017-05-02 NOTE — Assessment & Plan Note (Signed)
Neb x 1 and depo medrol 80 mg im and pred dose pack

## 2017-05-02 NOTE — Assessment & Plan Note (Signed)
Controlled, no change in medication Hyperlipidemia:Low fat diet discussed and encouraged.   Lipid Panel  Lab Results  Component Value Date   CHOL 154 03/29/2017   HDL 99 03/29/2017   LDLCALC 46 03/29/2017   TRIG 44 03/29/2017   CHOLHDL 1.6 03/29/2017

## 2017-05-03 DIAGNOSIS — J449 Chronic obstructive pulmonary disease, unspecified: Secondary | ICD-10-CM | POA: Diagnosis not present

## 2017-05-03 DIAGNOSIS — E785 Hyperlipidemia, unspecified: Secondary | ICD-10-CM | POA: Diagnosis not present

## 2017-06-02 DIAGNOSIS — E785 Hyperlipidemia, unspecified: Secondary | ICD-10-CM | POA: Diagnosis not present

## 2017-06-02 DIAGNOSIS — J449 Chronic obstructive pulmonary disease, unspecified: Secondary | ICD-10-CM | POA: Diagnosis not present

## 2017-06-20 ENCOUNTER — Other Ambulatory Visit: Payer: Self-pay | Admitting: Family Medicine

## 2017-07-03 DIAGNOSIS — J449 Chronic obstructive pulmonary disease, unspecified: Secondary | ICD-10-CM | POA: Diagnosis not present

## 2017-07-03 DIAGNOSIS — E785 Hyperlipidemia, unspecified: Secondary | ICD-10-CM | POA: Diagnosis not present

## 2017-08-03 DIAGNOSIS — J449 Chronic obstructive pulmonary disease, unspecified: Secondary | ICD-10-CM | POA: Diagnosis not present

## 2017-08-03 DIAGNOSIS — E785 Hyperlipidemia, unspecified: Secondary | ICD-10-CM | POA: Diagnosis not present

## 2017-08-29 ENCOUNTER — Encounter: Payer: Self-pay | Admitting: Family Medicine

## 2017-08-29 ENCOUNTER — Ambulatory Visit (INDEPENDENT_AMBULATORY_CARE_PROVIDER_SITE_OTHER): Payer: Medicare HMO | Admitting: Family Medicine

## 2017-08-29 VITALS — BP 148/70 | HR 63 | Temp 97.8°F | Resp 16 | Ht 72.0 in | Wt 158.0 lb

## 2017-08-29 DIAGNOSIS — Z23 Encounter for immunization: Secondary | ICD-10-CM | POA: Diagnosis not present

## 2017-08-29 DIAGNOSIS — Z0000 Encounter for general adult medical examination without abnormal findings: Secondary | ICD-10-CM

## 2017-08-29 DIAGNOSIS — E785 Hyperlipidemia, unspecified: Secondary | ICD-10-CM

## 2017-08-29 NOTE — Patient Instructions (Addendum)
Wellness with nurse 2nd week in January  MD f/u in 5.5 months, call if you need me sooner  Lipid and cmp first week in Novemebr  Flu vaccine today  All the best  Be careful not to fall  Thank you  for choosing [facility]. We consider it a privelige to serve you.  Delivering excellent health care in a caring and  compassionate way is our goal.  Partnering with you,  so that together we can achieve this goal is our strategy.

## 2017-08-29 NOTE — Assessment & Plan Note (Signed)
Annual exam as documented. Counseling done  re healthy lifestyle involving commitment to 150 minutes exercise per week, heart healthy diet, and attaining healthy weight.The importance of adequate sleep also discussed.  Immunization and cancer screening needs are specifically addressed at this visit.  

## 2017-08-29 NOTE — Progress Notes (Signed)
   Kyle Mosley     MRN: [id number]      DOB: 1935-08-08   HPI: Patient is in for annual physical exam. No other health concerns are expressed or addressed at the visit.  Immunization is reviewed , and  updated     PE; BP (!) 148/70   Pulse 63   Temp 97.8 F (36.6 C) (Other (Comment))   Resp 16   Ht 6' (1.829 m)   Wt 158 lb (71.7 kg)   SpO2 90% Comment: 3 liters  BMI 21.43 kg/m  Pleasant male, alert and oriented x 3, in no cardio-pulmonary distress.Chronically ill appearing Afebrile. HEENT No facial trauma or asymetry. Sinuses non tender.bitempioral wasting EOMI, pupils equally reactive to light. External ears normal,  Oropharynx moist, no exudate. Neck: decreased ROM, no adenopathy,JVD or thyromegaly.No bruits.  Chest: Clear to ascultation bilaterally.No crackles or wheezes.decreased air entry throughout Non tender to palpation  Breast: No asymetry,no masses. No nipple discharge or inversion. No axillary or supraclavicular adenopathy  Cardiovascular system; Heart sounds normal,  S1 and  S2 ,no S3.  No murmur, or thrill. Apical beat not displaced Peripheral pulses normal.  Abdomen: Soft, non tender, no organomegaly or masses. No bruits. Bowel sounds normal. No guarding, tenderness or rebound.  Rectal:  Not done, per patient peference    Musculoskeletal exam: Decreased though adequate  ROM of spine, hips , shoulders and knees. No deformity ,swelling or crepitus noted. No muscle wasting or atrophy.   Neurologic: Cranial nerves 2 to 12 intact. Power, tone ,sensation and reflexes normal throughout. No disturbance in gait. No tremor.  Skin: Intact, no ulceration, erythema , scaling or rash noted.Bruising noted Pigmentation normal throughout  Psych; Normal mood and affect. Judgement and concentration normal   Assessment & Plan:  Annual physical exam Annual exam as documented. Counseling done  re healthy lifestyle involving commitment to 150  minutes exercise per week, heart healthy diet, and attaining healthy weight.The importance of adequate sleep also discussed. Immunization and cancer screening needs are specifically addressed at this visit.

## 2017-09-02 DIAGNOSIS — E785 Hyperlipidemia, unspecified: Secondary | ICD-10-CM | POA: Diagnosis not present

## 2017-09-02 DIAGNOSIS — J449 Chronic obstructive pulmonary disease, unspecified: Secondary | ICD-10-CM | POA: Diagnosis not present

## 2017-09-22 ENCOUNTER — Other Ambulatory Visit: Payer: Self-pay

## 2017-09-22 DIAGNOSIS — F329 Major depressive disorder, single episode, unspecified: Secondary | ICD-10-CM

## 2017-09-22 DIAGNOSIS — F32A Depression, unspecified: Secondary | ICD-10-CM

## 2017-09-22 DIAGNOSIS — Z0000 Encounter for general adult medical examination without abnormal findings: Secondary | ICD-10-CM | POA: Diagnosis not present

## 2017-09-22 DIAGNOSIS — E785 Hyperlipidemia, unspecified: Secondary | ICD-10-CM | POA: Diagnosis not present

## 2017-09-22 LAB — COMPLETE METABOLIC PANEL WITH GFR
AG Ratio: 1.2 (calc) (ref 1.0–2.5)
ALBUMIN MSPROF: 3.4 g/dL — AB (ref 3.6–5.1)
ALT: 8 U/L — AB (ref 9–46)
AST: 15 U/L (ref 10–35)
Alkaline phosphatase (APISO): 65 U/L (ref 40–115)
BILIRUBIN TOTAL: 0.5 mg/dL (ref 0.2–1.2)
BUN: 25 mg/dL (ref 7–25)
CALCIUM: 8.8 mg/dL (ref 8.6–10.3)
CO2: 39 mmol/L — AB (ref 20–32)
CREATININE: 0.8 mg/dL (ref 0.70–1.11)
Chloride: 102 mmol/L (ref 98–110)
GFR, EST NON AFRICAN AMERICAN: 84 mL/min/{1.73_m2} (ref >=60)
GFR, Est African American: 98 mL/min/{1.73_m2} (ref >=60)
GLUCOSE: 83 mg/dL (ref 65–99)
Globulin: 2.9 g/dL (calc) (ref 1.9–3.7)
Potassium: 4.5 mmol/L (ref 3.5–5.3)
SODIUM: 143 mmol/L (ref 135–146)
TOTAL PROTEIN: 6.3 g/dL (ref 6.1–8.1)

## 2017-09-22 LAB — LIPID PANEL
Cholesterol: 127 mg/dL (ref <200)
HDL: 86 mg/dL (ref >40)
LDL Cholesterol (Calc): 30 mg/dL (calc)
NON-HDL CHOLESTEROL (CALC): 41 mg/dL (ref <130)
TRIGLYCERIDES: 38 mg/dL (ref <150)
Total CHOL/HDL Ratio: 1.5 (calc) (ref <5.0)

## 2017-09-22 MED ORDER — RANITIDINE HCL 150 MG PO TABS: ORAL_TABLET | ORAL | 1 refills | Status: DC

## 2017-09-22 MED ORDER — IPRATROPIUM BROMIDE 0.02 % IN SOLN: RESPIRATORY_TRACT | 1 refills | Status: DC

## 2017-09-22 MED ORDER — ERGOCALCIFEROL 1.25 MG (50000 UT) PO CAPS: 50000.0000 [IU] | ORAL_CAPSULE | ORAL | 1 refills | Status: DC

## 2017-09-22 MED ORDER — LOVASTATIN 40 MG PO TABS: 40.0000 mg | ORAL_TABLET | Freq: Every day | ORAL | 1 refills | Status: DC

## 2017-09-22 MED ORDER — PREDNISONE 5 MG PO TABS: 5.0000 mg | ORAL_TABLET | Freq: Every day | ORAL | 1 refills | Status: DC

## 2017-09-22 MED ORDER — ALBUTEROL SULFATE HFA 108 (90 BASE) MCG/ACT IN AERS: 2.0000 | INHALATION_SPRAY | Freq: Four times a day (QID) | RESPIRATORY_TRACT | 5 refills | Status: DC | PRN

## 2017-09-22 MED ORDER — MIRTAZAPINE 7.5 MG PO TABS
7.5000 mg | ORAL_TABLET | Freq: Every day | ORAL | 1 refills | Status: DC
Start: 2017-09-22 — End: 2018-04-19

## 2017-09-22 MED ORDER — ALBUTEROL SULFATE (2.5 MG/3ML) 0.083% IN NEBU: INHALATION_SOLUTION | RESPIRATORY_TRACT | 1 refills | Status: DC

## 2017-09-22 MED ORDER — FLUTICASONE-SALMETEROL 250-50 MCG/DOSE IN AEPB: 1.0000 | INHALATION_SPRAY | Freq: Two times a day (BID) | RESPIRATORY_TRACT | 1 refills | Status: DC

## 2017-09-23 ENCOUNTER — Other Ambulatory Visit: Payer: Self-pay | Admitting: Family Medicine

## 2017-09-23 ENCOUNTER — Other Ambulatory Visit: Payer: Self-pay

## 2017-09-23 MED ORDER — LOVASTATIN 10 MG PO TABS: 10.0000 mg | ORAL_TABLET | Freq: Every day | ORAL | 1 refills | Status: DC

## 2017-10-03 DIAGNOSIS — J449 Chronic obstructive pulmonary disease, unspecified: Secondary | ICD-10-CM | POA: Diagnosis not present

## 2017-10-03 DIAGNOSIS — E785 Hyperlipidemia, unspecified: Secondary | ICD-10-CM | POA: Diagnosis not present

## 2017-10-25 ENCOUNTER — Encounter: Payer: Self-pay | Admitting: Family Medicine

## 2017-11-02 DIAGNOSIS — E785 Hyperlipidemia, unspecified: Secondary | ICD-10-CM | POA: Diagnosis not present

## 2017-11-02 DIAGNOSIS — J449 Chronic obstructive pulmonary disease, unspecified: Secondary | ICD-10-CM | POA: Diagnosis not present

## 2017-11-28 ENCOUNTER — Ambulatory Visit: Payer: Medicare HMO

## 2017-11-28 ENCOUNTER — Emergency Department (HOSPITAL_COMMUNITY): Payer: Medicare HMO

## 2017-11-28 ENCOUNTER — Ambulatory Visit (INDEPENDENT_AMBULATORY_CARE_PROVIDER_SITE_OTHER): Payer: Medicare HMO

## 2017-11-28 ENCOUNTER — Encounter: Payer: Self-pay | Admitting: Family Medicine

## 2017-11-28 ENCOUNTER — Other Ambulatory Visit: Payer: Self-pay

## 2017-11-28 ENCOUNTER — Observation Stay (HOSPITAL_COMMUNITY)
Admission: EM | Admit: 2017-11-28 | Discharge: 2017-11-30 | Disposition: A | Discharge status: Home / Self Care | Payer: Medicare HMO | Attending: Internal Medicine | Admitting: Internal Medicine

## 2017-11-28 ENCOUNTER — Encounter (HOSPITAL_COMMUNITY): Payer: Self-pay

## 2017-11-28 ENCOUNTER — Ambulatory Visit (INDEPENDENT_AMBULATORY_CARE_PROVIDER_SITE_OTHER): Payer: Medicare HMO | Admitting: Family Medicine

## 2017-11-28 VITALS — BP 142/74 | HR 97 | Temp 98.5°F | Resp 16 | Ht 72.0 in | Wt 154.5 lb

## 2017-11-28 VITALS — BP 142/74 | HR 80 | Resp 16 | Ht 72.0 in | Wt 154.0 lb

## 2017-11-28 DIAGNOSIS — R0902 Hypoxemia: Secondary | ICD-10-CM

## 2017-11-28 DIAGNOSIS — I4510 Unspecified right bundle-branch block: Secondary | ICD-10-CM | POA: Insufficient documentation

## 2017-11-28 DIAGNOSIS — J189 Pneumonia, unspecified organism: Secondary | ICD-10-CM | POA: Diagnosis present

## 2017-11-28 DIAGNOSIS — R092 Respiratory arrest: Secondary | ICD-10-CM | POA: Diagnosis not present

## 2017-11-28 DIAGNOSIS — Z87891 Personal history of nicotine dependence: Secondary | ICD-10-CM | POA: Diagnosis not present

## 2017-11-28 DIAGNOSIS — J45909 Unspecified asthma, uncomplicated: Secondary | ICD-10-CM | POA: Diagnosis not present

## 2017-11-28 DIAGNOSIS — Z0000 Encounter for general adult medical examination without abnormal findings: Secondary | ICD-10-CM | POA: Diagnosis not present

## 2017-11-28 DIAGNOSIS — J9621 Acute and chronic respiratory failure with hypoxia: Secondary | ICD-10-CM | POA: Diagnosis not present

## 2017-11-28 DIAGNOSIS — I10 Essential (primary) hypertension: Secondary | ICD-10-CM | POA: Insufficient documentation

## 2017-11-28 DIAGNOSIS — I714 Abdominal aortic aneurysm, without rupture: Secondary | ICD-10-CM | POA: Insufficient documentation

## 2017-11-28 DIAGNOSIS — R7302 Impaired glucose tolerance (oral): Secondary | ICD-10-CM | POA: Insufficient documentation

## 2017-11-28 DIAGNOSIS — J441 Chronic obstructive pulmonary disease with (acute) exacerbation: Secondary | ICD-10-CM | POA: Diagnosis not present

## 2017-11-28 DIAGNOSIS — J181 Lobar pneumonia, unspecified organism: Secondary | ICD-10-CM | POA: Insufficient documentation

## 2017-11-28 DIAGNOSIS — I272 Pulmonary hypertension, unspecified: Secondary | ICD-10-CM | POA: Diagnosis present

## 2017-11-28 DIAGNOSIS — R918 Other nonspecific abnormal finding of lung field: Secondary | ICD-10-CM | POA: Insufficient documentation

## 2017-11-28 DIAGNOSIS — I719 Aortic aneurysm of unspecified site, without rupture: Secondary | ICD-10-CM

## 2017-11-28 DIAGNOSIS — E46 Unspecified protein-calorie malnutrition: Secondary | ICD-10-CM | POA: Diagnosis not present

## 2017-11-28 DIAGNOSIS — E785 Hyperlipidemia, unspecified: Secondary | ICD-10-CM | POA: Diagnosis present

## 2017-11-28 DIAGNOSIS — Z7982 Long term (current) use of aspirin: Secondary | ICD-10-CM | POA: Insufficient documentation

## 2017-11-28 DIAGNOSIS — I700 Atherosclerosis of aorta: Secondary | ICD-10-CM | POA: Diagnosis not present

## 2017-11-28 DIAGNOSIS — Z79899 Other long term (current) drug therapy: Secondary | ICD-10-CM | POA: Diagnosis not present

## 2017-11-28 DIAGNOSIS — E43 Unspecified severe protein-calorie malnutrition: Secondary | ICD-10-CM

## 2017-11-28 DIAGNOSIS — R0602 Shortness of breath: Secondary | ICD-10-CM | POA: Diagnosis not present

## 2017-11-28 LAB — TROPONIN I: Troponin I: <0.03 ng/mL (ref <0.03)

## 2017-11-28 LAB — COMPREHENSIVE METABOLIC PANEL
ALK PHOS: 77 U/L (ref 38–126)
ALT: 15 U/L — ABNORMAL LOW (ref 17–63)
ANION GAP: 9 (ref 5–15)
AST: 24 U/L (ref 15–41)
Albumin: 3.1 g/dL — ABNORMAL LOW (ref 3.5–5.0)
BILIRUBIN TOTAL: 0.5 mg/dL (ref 0.3–1.2)
BUN: 26 mg/dL — ABNORMAL HIGH (ref 6–20)
CALCIUM: 9.1 mg/dL (ref 8.9–10.3)
CO2: 35 mmol/L — ABNORMAL HIGH (ref 22–32)
Chloride: 100 mmol/L — ABNORMAL LOW (ref 101–111)
Creatinine, Ser: 0.87 mg/dL (ref 0.61–1.24)
GFR calc Af Amer: >60 mL/min (ref >60)
Glucose, Bld: 98 mg/dL (ref 65–99)
POTASSIUM: 4.7 mmol/L (ref 3.5–5.1)
Sodium: 144 mmol/L (ref 135–145)
TOTAL PROTEIN: 7.4 g/dL (ref 6.5–8.1)

## 2017-11-28 LAB — CBC WITH DIFFERENTIAL/PLATELET
Basophils Absolute: 0 10*3/uL (ref 0.0–0.1)
Basophils Relative: 0 %
Eosinophils Absolute: 0.2 10*3/uL (ref 0.0–0.7)
Eosinophils Relative: 3 %
HEMATOCRIT: 47.7 % (ref 39.0–52.0)
Hemoglobin: 14.7 g/dL (ref 13.0–17.0)
LYMPHS ABS: 0.7 10*3/uL (ref 0.7–4.0)
LYMPHS PCT: 10 %
MCH: 30.1 pg (ref 26.0–34.0)
MCHC: 30.8 g/dL (ref 30.0–36.0)
MCV: 97.5 fL (ref 78.0–100.0)
MONOS PCT: 11 %
Monocytes Absolute: 0.8 10*3/uL (ref 0.1–1.0)
NEUTROS ABS: 5.6 10*3/uL (ref 1.7–7.7)
Neutrophils Relative %: 77 %
Platelets: 256 10*3/uL (ref 150–400)
RBC: 4.89 MIL/uL (ref 4.22–5.81)
RDW: 13.7 % (ref 11.5–15.5)
WBC: 7.3 10*3/uL (ref 4.0–10.5)

## 2017-11-28 LAB — D-DIMER, QUANTITATIVE (NOT AT ARMC)

## 2017-11-28 LAB — BRAIN NATRIURETIC PEPTIDE: B NATRIURETIC PEPTIDE 5: 66 pg/mL (ref 0.0–100.0)

## 2017-11-28 MED ORDER — UNABLE TO FIND: 0 refills | Status: DC

## 2017-11-28 MED ORDER — PANTOPRAZOLE SODIUM 40 MG PO TBEC
40.0000 mg | DELAYED_RELEASE_TABLET | Freq: Every day | ORAL | Status: DC
  Administered 2017-11-29 – 2017-11-30 (×2): 40 mg via ORAL
  Filled 2017-11-28 (×2): qty 1

## 2017-11-28 MED ORDER — IOPAMIDOL (ISOVUE-370) INJECTION 76%
100.0000 mL | Freq: Once | INTRAVENOUS | Status: AC | PRN
  Administered 2017-11-28: 100 mL via INTRAVENOUS

## 2017-11-28 MED ORDER — PRAVASTATIN SODIUM 10 MG PO TABS
10.0000 mg | ORAL_TABLET | Freq: Every day | ORAL | Status: DC
  Administered 2017-11-28 – 2017-11-30 (×3): 10 mg via ORAL
  Filled 2017-11-28 (×3): qty 1

## 2017-11-28 MED ORDER — ALBUTEROL SULFATE (2.5 MG/3ML) 0.083% IN NEBU
2.5000 mg | INHALATION_SOLUTION | Freq: Once | RESPIRATORY_TRACT | Status: AC
  Administered 2017-11-28: 2.5 mg via RESPIRATORY_TRACT

## 2017-11-28 MED ORDER — VITAMIN D (ERGOCALCIFEROL) 1.25 MG (50000 UNIT) PO CAPS: 50000.0000 [IU] | ORAL_CAPSULE | ORAL | Status: DC

## 2017-11-28 MED ORDER — DEXTROSE 5 % IV SOLN
1.0000 g | Freq: Once | INTRAVENOUS | Status: AC
  Administered 2017-11-29: 1 g via INTRAVENOUS
  Filled 2017-11-28: qty 10

## 2017-11-28 MED ORDER — MAGNESIUM SULFATE 2 GM/50ML IV SOLN
2.0000 g | Freq: Once | INTRAVENOUS | Status: AC
  Administered 2017-11-28: 2 g via INTRAVENOUS
  Filled 2017-11-28: qty 50

## 2017-11-28 MED ORDER — DEXTROSE 5 % IV SOLN
500.0000 mg | Freq: Once | INTRAVENOUS | Status: DC
  Filled 2017-11-28: qty 500

## 2017-11-28 MED ORDER — DEXTROSE 5 % IV SOLN
1.0000 g | Freq: Once | INTRAVENOUS | Status: AC
  Administered 2017-11-28: 1 g via INTRAVENOUS
  Filled 2017-11-28: qty 10

## 2017-11-28 MED ORDER — IPRATROPIUM-ALBUTEROL 0.5-2.5 (3) MG/3ML IN SOLN
3.0000 mL | Freq: Four times a day (QID) | RESPIRATORY_TRACT | Status: DC
Start: 2017-11-28 — End: 2017-11-29
  Administered 2017-11-29 (×4): 3 mL via RESPIRATORY_TRACT
  Filled 2017-11-28 (×3): qty 3

## 2017-11-28 MED ORDER — IPRATROPIUM BROMIDE 0.02 % IN SOLN
1.0000 mg | Freq: Once | RESPIRATORY_TRACT | Status: AC
  Administered 2017-11-28: 1 mg via RESPIRATORY_TRACT
  Filled 2017-11-28: qty 5

## 2017-11-28 MED ORDER — ENSURE ENLIVE PO LIQD
237.0000 mL | Freq: Two times a day (BID) | ORAL | Status: DC
  Administered 2017-11-29 – 2017-11-30 (×4): 237 mL via ORAL

## 2017-11-28 MED ORDER — ERGOCALCIFEROL 1.25 MG (50000 UT) PO CAPS: 50000.0000 [IU] | ORAL_CAPSULE | ORAL | 1 refills | Status: DC

## 2017-11-28 MED ORDER — ALBUTEROL (5 MG/ML) CONTINUOUS INHALATION SOLN
15.0000 mg/h | INHALATION_SOLUTION | RESPIRATORY_TRACT | Status: DC
  Administered 2017-11-28: 15 mg/h via RESPIRATORY_TRACT
  Filled 2017-11-28: qty 20

## 2017-11-28 MED ORDER — DEXTROSE 5 % IV SOLN
500.0000 mg | Freq: Once | INTRAVENOUS | Status: AC
  Administered 2017-11-28: 500 mg via INTRAVENOUS
  Filled 2017-11-28: qty 500

## 2017-11-28 MED ORDER — MOMETASONE FURO-FORMOTEROL FUM 200-5 MCG/ACT IN AERO
2.0000 | INHALATION_SPRAY | Freq: Two times a day (BID) | RESPIRATORY_TRACT | Status: DC
Start: 2017-11-28 — End: 2017-11-29
  Administered 2017-11-29: 2 via RESPIRATORY_TRACT
  Filled 2017-11-28 (×2): qty 8.8

## 2017-11-28 MED ORDER — PREDNISONE 5 MG (21) PO TBPK: 5.0000 mg | ORAL_TABLET | ORAL | 0 refills | Status: DC

## 2017-11-28 MED ORDER — ORAL CARE MOUTH RINSE
15.0000 mL | Freq: Two times a day (BID) | OROMUCOSAL | Status: DC
  Administered 2017-11-28 – 2017-11-29 (×3): 15 mL via OROMUCOSAL

## 2017-11-28 MED ORDER — AZITHROMYCIN 250 MG PO TABS: ORAL_TABLET | ORAL | 0 refills | Status: DC

## 2017-11-28 MED ORDER — ENOXAPARIN SODIUM 40 MG/0.4ML ~~LOC~~ SOLN
40.0000 mg | SUBCUTANEOUS | Status: DC
  Administered 2017-11-28 – 2017-11-29 (×2): 40 mg via SUBCUTANEOUS
  Filled 2017-11-28 (×2): qty 0.4

## 2017-11-28 MED ORDER — ASPIRIN EC 81 MG PO TBEC
81.0000 mg | DELAYED_RELEASE_TABLET | Freq: Every day | ORAL | Status: DC
  Administered 2017-11-29 – 2017-11-30 (×2): 81 mg via ORAL
  Filled 2017-11-28 (×2): qty 1

## 2017-11-28 MED ORDER — MIRTAZAPINE 15 MG PO TABS
7.5000 mg | ORAL_TABLET | Freq: Every day | ORAL | Status: DC
  Administered 2017-11-28 – 2017-11-29 (×2): 7.5 mg via ORAL
  Filled 2017-11-28 (×2): qty 1

## 2017-11-28 MED ORDER — PREDNISONE 20 MG PO TABS
50.0000 mg | ORAL_TABLET | Freq: Every day | ORAL | Status: DC
  Administered 2017-11-29: 09:00:00 50 mg via ORAL
  Filled 2017-11-28: qty 2

## 2017-11-28 NOTE — Patient Instructions (Signed)
Kyle Mosley , Thank you for taking time to come for your Medicare Wellness Visit. I appreciate your ongoing commitment to your health goals. Please review the following plan we discussed and let me know if I can assist you in the future.   Screening recommendations/referrals: Colonoscopy: Due per last report- Discuss with Dr. Moshe Cipro Recommended yearly ophthalmology/optometry visit for glaucoma screening and checkup Recommended yearly dental visit for hygiene and checkup  Vaccinations: Influenza vaccine: October 2019 Pneumococcal vaccine: 2020 Tdap vaccine: Due- discuss with Dr. Moshe Cipro Shingles vaccine: Discuss updating to Shingrix    Advanced directives: Discussed today in office  Conditions/risks identified: Fall  Next appointment: 02/09/2018  Preventive Care 81 Years and Older, Male Preventive care refers to lifestyle choices and visits with your health care provider that can promote health and wellness. What does preventive care include?  A yearly physical exam. This is also called an annual well check.  Dental exams once or twice a year.  Routine eye exams. Ask your health care provider how often you should have your eyes checked.  Personal lifestyle choices, including:  Daily care of your teeth and gums.  Regular physical activity.  Eating a healthy diet.  Avoiding tobacco and drug use.  Limiting alcohol use.  Practicing safe sex.  Taking low doses of aspirin every day.  Taking vitamin and mineral supplements as recommended by your health care provider. What happens during an annual well check? The services and screenings done by your health care provider during your annual well check will depend on your age, overall health, lifestyle risk factors, and family history of disease. Counseling  Your health care provider may ask you questions about your:  Alcohol use.  Tobacco use.  Drug use.  Emotional well-being.  Home and relationship  well-being.  Sexual activity.  Eating habits.  History of falls.  Memory and ability to understand (cognition).  Work and work Statistician. Screening  You may have the following tests or measurements:  Height, weight, and BMI.  Blood pressure.  Lipid and cholesterol levels. These may be checked every 5 years, or more frequently if you are over 36 years old.  Skin check.  Lung cancer screening. You may have this screening every year starting at age 54 if you have a 30-pack-year history of smoking and currently smoke or have quit within the past 15 years.  Fecal occult blood test (FOBT) of the stool. You may have this test every year starting at age 23.  Flexible sigmoidoscopy or colonoscopy. You may have a sigmoidoscopy every 5 years or a colonoscopy every 10 years starting at age 30.  Prostate cancer screening. Recommendations will vary depending on your family history and other risks.  Hepatitis C blood test.  Hepatitis B blood test.  Sexually transmitted disease (STD) testing.  Diabetes screening. This is done by checking your blood sugar (glucose) after you have not eaten for a while (fasting). You may have this done every 1-3 years.  Abdominal aortic aneurysm (AAA) screening. You may need this if you are a current or former smoker.  Osteoporosis. You may be screened starting at age 59 if you are at high risk. Talk with your health care provider about your test results, treatment options, and if necessary, the need for more tests. Vaccines  Your health care provider may recommend certain vaccines, such as:  Influenza vaccine. This is recommended every year.  Tetanus, diphtheria, and acellular pertussis (Tdap, Td) vaccine. You may need a Td booster every 10 years.  Zoster vaccine. You may need this after age 75.  Pneumococcal 13-valent conjugate (PCV13) vaccine. One dose is recommended after age 74.  Pneumococcal polysaccharide (PPSV23) vaccine. One dose is  recommended after age 69. Talk to your health care provider about which screenings and vaccines you need and how often you need them. This information is not intended to replace advice given to you by your health care provider. Make sure you discuss any questions you have with your health care provider. Document Released: 12/05/2015 Document Revised: 07/28/2016 Document Reviewed: 09/09/2015 Elsevier Interactive Patient Education  2017 [place] Prevention in the Home Falls can cause injuries. They can happen to people of all ages. There are many things you can do to make your home safe and to help prevent falls. What can I do on the outside of my home?  Regularly fix the edges of walkways and driveways and fix any cracks.  Remove anything that might make you trip as you walk through a door, such as a raised step or threshold.  Trim any bushes or trees on the path to your home.  Use bright outdoor lighting.  Clear any walking paths of anything that might make someone trip, such as rocks or tools.  Regularly check to see if handrails are loose or broken. Make sure that both sides of any steps have handrails.  Any raised decks and porches should have guardrails on the edges.  Have any leaves, snow, or ice cleared regularly.  Use sand or salt on walking paths during winter.  Clean up any spills in your garage right away. This includes oil or grease spills. What can I do in the bathroom?  Use night lights.  Install grab bars by the toilet and in the tub and shower. Do not use towel bars as grab bars.  Use non-skid mats or decals in the tub or shower.  If you need to sit down in the shower, use a plastic, non-slip stool.  Keep the floor dry. Clean up any water that spills on the floor as soon as it happens.  Remove soap buildup in the tub or shower regularly.  Attach bath mats securely with double-sided non-slip rug tape.  Do not have throw rugs and other things on  the floor that can make you trip. What can I do in the bedroom?  Use night lights.  Make sure that you have a light by your bed that is easy to reach.  Do not use any sheets or blankets that are too big for your bed. They should not hang down onto the floor.  Have a firm chair that has side arms. You can use this for support while you get dressed.  Do not have throw rugs and other things on the floor that can make you trip. What can I do in the kitchen?  Clean up any spills right away.  Avoid walking on wet floors.  Keep items that you use a lot in easy-to-reach places.  If you need to reach something above you, use a strong step stool that has a grab bar.  Keep electrical cords out of the way.  Do not use floor polish or wax that makes floors slippery. If you must use wax, use non-skid floor wax.  Do not have throw rugs and other things on the floor that can make you trip. What can I do with my stairs?  Do not leave any items on the stairs.  Make sure that there are  handrails on both sides of the stairs and use them. Fix handrails that are broken or loose. Make sure that handrails are as long as the stairways.  Check any carpeting to make sure that it is firmly attached to the stairs. Fix any carpet that is loose or worn.  Avoid having throw rugs at the top or bottom of the stairs. If you do have throw rugs, attach them to the floor with carpet tape.  Make sure that you have a light switch at the top of the stairs and the bottom of the stairs. If you do not have them, ask someone to add them for you. What else can I do to help prevent falls?  Wear shoes that:  Do not have high heels.  Have rubber bottoms.  Are comfortable and fit you well.  Are closed at the toe. Do not wear sandals.  If you use a stepladder:  Make sure that it is fully opened. Do not climb a closed stepladder.  Make sure that both sides of the stepladder are locked into place.  Ask someone to  hold it for you, if possible.  Clearly mark and make sure that you can see:  Any grab bars or handrails.  First and last steps.  Where the edge of each step is.  Use tools that help you move around (mobility aids) if they are needed. These include:  Canes.  Walkers.  Scooters.  Crutches.  Turn on the lights when you go into a dark area. Replace any light bulbs as soon as they burn out.  Set up your furniture so you have a clear path. Avoid moving your furniture around.  If any of your floors are uneven, fix them.  If there are any pets around you, be aware of where they are.  Review your medicines with your doctor. Some medicines can make you feel dizzy. This can increase your chance of falling. Ask your doctor what other things that you can do to help prevent falls. This information is not intended to replace advice given to you by your health care provider. Make sure you discuss any questions you have with your health care provider. Document Released: 09/04/2009 Document Revised: 04/15/2016 Document Reviewed: 12/13/2014 Elsevier Interactive Patient Education  2017 [employer].

## 2017-11-28 NOTE — H&P (Signed)
History and Physical    Kyle Mosley:[id number] DOB: Feb 11, 1935 DOA: 11/28/2017  PCP: Fayrene Helper, MD   Patient coming from: Home  Chief Complaint: Dyspnea with hypoxemia in office  HPI: Kyle Mosley is a 83 y.o. male with medical history significant for hypertension, dyslipidemia, pulmonary hypertension, impaired fasting glucose, benign prostatic hypertrophy, and COPD with chronic hypoxemia on 3 L home oxygen who presented to his primary care physician's office earlier today for routine checkup and discussed how he was short of breath over the last 3 weeks.  At that time pulse oximetry was checked demonstrating saturation of 79% on his home 3 L nasal cannula.  He was given an albuterol nebulized treatment as well as Depo-Medrol 80 mg IM and was driven to the emergency department by his primary care provider for further evaluation.  Patient denies any recent fever, chills, chest tightness, wheezing, or weight loss.  He states that he has shortness of breath usually as his baseline and did not feel that he had to come to the hospital for further evaluation.  He denies chest pain or any other symptomatology at this time.   ED Course: Vital signs were noted to be stable and telemetry demonstrates a wide complex QRS with EKG findings of left anterior fascicular block.  CT of the chest with contrast was performed to evaluate for PE as his d-dimer was noted to be elevated.  Study was negative for PE but did demonstrate findings of right lower lobe pneumonia as well as possible aortic aneurysm of 3.1 cm dilation.  Patient is otherwise resting comfortably and denies any respiratory distress or complaints at this time.  He is on his 3 L nasal cannula with good O2 saturation noted.  He has been started on IV Rocephin and azithromycin.  Review of Systems: As per HPI otherwise 10 point review of systems negative.   Past Medical History:  Diagnosis Date  . Asthma   . Colon polyps   . COPD  (chronic obstructive pulmonary disease) ([place])    3L home O2  . Diverticulosis   . Enlarged prostate    with elevated PSA  . Hyperlipidemia   . Hypertension   . Prediabetes 2011  . Pulmonary hypertension ([place]) 2012    Past Surgical History:  Procedure Laterality Date  . COLONOSCOPY  03/21/2007   Dr. Jonny Ruiz diverticula, normal rectum  . COLONOSCOPY  05/04/2012   Procedure: COLONOSCOPY;  Surgeon: Daneil Dolin, MD;  Location: [facility];  Service: Endoscopy;  Laterality: N/A;  7:30  . PROSTATE BIOPSY       reports that he quit smoking about 21 years ago. He has a 25.00 pack-year smoking history. he has never used smokeless tobacco. He reports that he does not drink alcohol or use drugs.  No Known Allergies  Family History  Problem Relation Age of Onset  . Heart attack Father   . Alcohol abuse Mother   . Hypertension Brother   . COPD Sister     Prior to Admission medications   Medication Sig Start Date End Date Taking? Authorizing Provider  albuterol (PROVENTIL) (2.5 MG/3ML) 0.083% nebulizer solution INHALE THE CONTENTS OF 1 VIAL VIA NEBULIZER TWO TO THREE TIMES DAILY 09/22/17  Yes Fayrene Helper, MD  albuterol (VENTOLIN HFA) 108 (90 Base) MCG/ACT inhaler Inhale 2 puffs into the lungs every 6 (six) hours as needed for wheezing or shortness of breath. 09/22/17  Yes Fayrene Helper, MD  aspirin EC 81 MG  tablet Take 81 mg by mouth daily.   Yes [provider]  ergocalciferol (VITAMIN D2) 50000 units capsule Take 1 capsule (50,000 Units total) by mouth once a week. One capsule once weekly Patient taking differently: Take 50,000 Units by mouth every Monday. One capsule once weekly 09/22/17  Yes Fayrene Helper, MD  Fluticasone-Salmeterol (ADVAIR) 250-50 MCG/DOSE AEPB Inhale 1 puff into the lungs 2 (two) times daily. 09/22/17  Yes Fayrene Helper, MD  ipratropium (ATROVENT) 0.02 % nebulizer solution USE ONE VIAL IN NEBULIZER TWO TO THREE TIMES A DAY.  09/22/17  Yes Fayrene Helper, MD  lovastatin (MEVACOR) 10 MG tablet Take 1 tablet (10 mg total) by mouth at bedtime. 09/23/17  Yes Fayrene Helper, MD  mirtazapine (REMERON) 7.5 MG tablet Take 1 tablet (7.5 mg total) by mouth at bedtime. 09/22/17  Yes Fayrene Helper, MD  predniSONE (DELTASONE) 5 MG tablet Take 5 mg by mouth daily with breakfast.   Yes [provider]  ranitidine (ZANTAC) 150 MG tablet TAKE 1 TABLET(150 MG) BY MOUTH DAILY 09/22/17  Yes Fayrene Helper, MD  azithromycin (ZITHROMAX) 250 MG tablet Two tablets on day one then one tablet once daily for an additional 4 days 11/28/17   Fayrene Helper, MD  ergocalciferol (VITAMIN D2) 50000 units capsule Take 1 capsule (50,000 Units total) by mouth once a week. One capsule once weekly 11/28/17   Fayrene Helper, MD  predniSONE (STERAPRED UNI-PAK 21 TAB) 5 MG (21) TBPK tablet Take 1 tablet (5 mg total) by mouth as directed. Use as directed 11/28/17   Fayrene Helper, MD  UNABLE TO FIND Nebulizer machine and tubing  DX J44.1 11/28/17   Fayrene Helper, MD    Physical Exam: Vitals:   11/28/17 1915 11/28/17 1930 11/28/17 1945 11/28/17 2000  BP:  121/78  126/79  Pulse: 91 90 93 88  Resp: 16 18 16 16   Temp:      TempSrc:      SpO2: 96% 97% 100% 98%    Constitutional: NAD, calm, comfortable Vitals:   11/28/17 1915 11/28/17 1930 11/28/17 1945 11/28/17 2000  BP:  121/78  126/79  Pulse: 91 90 93 88  Resp: 16 18 16 16   Temp:      TempSrc:      SpO2: 96% 97% 100% 98%   Eyes: lids and conjunctivae normal ENMT: Mucous membranes are moist.  Neck: normal, supple Respiratory: clear to auscultation bilaterally. Normal respiratory effort. No accessory muscle use.  Cardiovascular: Regular rate and rhythm, no murmurs. No extremity edema. Abdomen: no tenderness, no distention. Bowel sounds positive.  Musculoskeletal:  No joint deformity upper and lower extremities.   Skin: no rashes, lesions, ulcers.    Psychiatric: Normal judgment and insight. Alert and oriented x 3. Normal mood.   Labs on Admission: I have personally reviewed following labs and imaging studies  CBC: Recent Labs  Lab 11/28/17 1539  WBC 7.3  NEUTROABS 5.6  HGB 14.7  HCT 47.7  MCV 97.5  PLT 798   Basic Metabolic Panel: Recent Labs  Lab 11/28/17 1539  NA 144  K 4.7  CL 100*  CO2 35*  GLUCOSE 98  BUN 26*  CREATININE 0.87  CALCIUM 9.1   GFR: Estimated Creatinine Clearance: 67 mL/min (by C-G formula based on SCr of 0.87 mg/dL). Liver Function Tests: Recent Labs  Lab 11/28/17 1539  AST 24  ALT 15*  ALKPHOS 77  BILITOT 0.5  PROT 7.4  ALBUMIN  3.1*   No results for input(s): LIPASE, AMYLASE in the last 168 hours. No results for input(s): AMMONIA in the last 168 hours. Coagulation Profile: No results for input(s): INR, PROTIME in the last 168 hours. Cardiac Enzymes: Recent Labs  Lab 11/28/17 1539  TROPONINI <0.03   BNP (last 3 results) No results for input(s): PROBNP in the last 8760 hours. HbA1C: No results for input(s): HGBA1C in the last 72 hours. CBG: No results for input(s): GLUCAP in the last 168 hours. Lipid Profile: No results for input(s): CHOL, HDL, LDLCALC, TRIG, CHOLHDL, LDLDIRECT in the last 72 hours. Thyroid Function Tests: No results for input(s): TSH, T4TOTAL, FREET4, T3FREE, THYROIDAB in the last 72 hours. Anemia Panel: No results for input(s): VITAMINB12, FOLATE, FERRITIN, TIBC, IRON, RETICCTPCT in the last 72 hours. Urine analysis: No results found for: COLORURINE, APPEARANCEUR, LABSPEC, [place], GLUCOSEU, HGBUR, BILIRUBINUR, KETONESUR, PROTEINUR, UROBILINOGEN, NITRITE, LEUKOCYTESUR  Radiological Exams on Admission: Dg Chest 2 View  Result Date: 11/28/2017 CLINICAL DATA:  Three weeks of shortness of breath. Low oxygen saturation in doctor's office today. EXAM: CHEST  2 VIEW COMPARISON:  PA and lateral chest x-ray of Mar 29, 2017. FINDINGS: The lungs remain hyperinflated.  The interstitial markings are coarse. There is no alveolar infiltrate or pleural effusion. There are bullous changes in the right apex which are stable. The heart and pulmonary vascularity are normal. There is calcification in the wall of the aortic arch. There is multilevel degenerative disc disease of the thoracic spine. IMPRESSION: Bullous emphysematous changes, stable. No pneumonia, CHF, nor other acute cardiopulmonary abnormality. Thoracic aortic atherosclerosis. Electronically Signed   By: David  Martinique M.D.   On: 11/28/2017 15:56   Ct Angio Chest Pe W And/or Wo Contrast  Result Date: 11/28/2017 CLINICAL DATA:  Shortness of breath for 3 weeks.  Low 2 sats. EXAM: CT ANGIOGRAPHY CHEST WITH CONTRAST TECHNIQUE: Multidetector CT imaging of the chest was performed using the standard protocol during bolus administration of intravenous contrast. Multiplanar CT image reconstructions and MIPs were obtained to evaluate the vascular anatomy. CONTRAST:  139mL ISOVUE-370 IOPAMIDOL (ISOVUE-370) INJECTION 76% COMPARISON:  Chest radiograph 11/28/2017 FINDINGS: Cardiovascular: Negative for pulmonary embolism. Coronary artery calcifications. Normal caliber of the thoracic aorta without dissection. Heart size is normal without significant pericardial fluid. Few atherosclerotic calcifications. Mediastinum/Nodes: Right hilar tissue is mildly prominent. Precarinal lymph node measures 1.2 cm in the short axis. Mildly prominent left hilar tissue measuring 0.8 cm in the short axis on sequence 5, image 63. Lungs/Pleura: Trachea and mainstem bronchi are patent. Paraseptal and centrilobular emphysema. Large bleb at the right lung apex. Bronchial wall thickening in the proximal right lower lobe bronchi. Multiple small cysts throughout the right lower lobe associated with some bronchiectasis. Patchy parenchymal densities in the right lower lobe with a peripheral opacity in the right lower lobe on sequence 7, image 106. Extensive  parenchymal densities throughout the right lower lobe. Small amount of parenchymal disease in the right middle lobe base. Bronchial wall thickening in the left lower lobe. Few patchy parenchymal densities associated with bronchiectasis in the left lower lobe. No large pleural effusions. Upper Abdomen: Probable cyst in the left kidney lower pole region. Atherosclerotic calcifications in the abdominal aorta. Infrarenal abdominal aorta is mildly enlarged measuring up to 3.1 cm. Musculoskeletal: No acute bone abnormality. Bridging osteophytes in the anterior thoracic spine. Disc space narrowing at cervicothoracic junction and lower cervical spine. Review of the MIP images confirms the above findings. IMPRESSION: Negative for a pulmonary embolism. Right lower  lobe pneumonia. Patchy parenchymal disease in the right lower lobe associated with bronchial wall thickening and extensive bronchiectasis. Cannot exclude mild infectious or inflammatory changes in left lower lobe and right middle lobe. Aortic Atherosclerosis (ICD10-I70.0) and Emphysema (ICD10-J43.9). Aortic aneurysm NOS (ICD10-I71.9). Partially visualized abdominal aorta measures up to 3.1 cm. Consider an abdominal ultrasound for more complete evaluation. Electronically Signed   By: Markus Daft M.D.   On: 11/28/2017 20:02    EKG: Independently reviewed. With RBBB and LAFB.  Assessment/Plan Principal Problem:   Acute on chronic respiratory failure with hypoxia (HCC) Active Problems:   Hyperlipidemia LDL goal <100   Pulmonary HTN (HCC)   COPD exacerbation (HCC)   Pneumonia    Right lower lobe pneumonia-community acquired Continue azithromycin and Rocephin Sputum culture and sensitivities Respiratory panel Urine Legionella and strep pneumonia Blood cultures pending  Mild acute COPD exacerbation secondary to above-improved Prednisone 50 mg daily DuoNeb every 6 hours Continue inhaled steroid  Acute on chronic hypoxemic respiratory failure  secondary to above-resolved Patient back on baseline home O2 of 3 L Ambulatory O2 saturation in a.m.  Abdominal aortic aneurysm-3.1cm Abdominal US for complete evaluation  Bundle branch block on EKG -No prior diagnosed cardiomyopathy Obtain 2D echocardiogram  Pulmonary hypertension Obtain 2D echocardiogram  Dyslipidemia Continue statin  BPH Monitor  DVT prophylaxis: Lovenox Code Status: Full Family Communication: Wife at bedside Disposition Plan:Home by AM Consults called:None Admission status: Obs, tele   Pratik Darleen Crocker DO [facility] Pager [phone]  If 7PM-7AM, please contact night-coverage [email or web address] Password [email or web address]  11/28/2017, 8:45 PM

## 2017-11-28 NOTE — ED Provider Notes (Signed)
[facility] EMERGENCY DEPARTMENT Provider Note   CSN: [id number] Arrival date & time: 11/28/17  1504     History   Chief Complaint Chief Complaint  Patient presents with  . Shortness of Breath    HPI Kyle Mosley is a 83 y.o. male.   Shortness of Breath  This is a new problem. The average episode lasts 2 weeks. The problem occurs continuously.The problem has been gradually worsening. Associated symptoms include cough and wheezing. Pertinent negatives include no fever, no rhinorrhea, no hemoptysis, no syncope, no vomiting, no abdominal pain, no rash, no leg pain and no leg swelling. He has tried ipratropium inhalers and beta-agonist inhalers for the symptoms. The treatment provided no relief. He has had no prior hospitalizations. He has had no prior ED visits. Associated medical issues include COPD. Associated medical issues do not include PE, CAD or DVT.    Past Medical History:  Diagnosis Date  . Asthma   . Colon polyps   . COPD (chronic obstructive pulmonary disease) ([place])    3L home O2  . Diverticulosis   . Enlarged prostate    with elevated PSA  . Hyperlipidemia   . Hypertension   . Prediabetes 2011  . Pulmonary hypertension ([place]) 2012    Patient Active Problem List   Diagnosis Date Noted  . Pneumonia 11/28/2017  . Acute on chronic respiratory failure with hypoxia ([place]) 02/18/2017  . COPD exacerbation ([place]) 02/17/2017  . Hypoxia 12/28/2016  . Annual physical exam 05/27/2016  . Loss of weight 05/27/2016  . Elevated PSA 03/26/2015  . Reduced vision 05/27/2014  . Repeated falls 09/08/2013  . Pulmonary HTN ([place]) 06/07/2013  . COPD (chronic obstructive pulmonary disease) ([place]) 05/08/2013  . Primary generalized (osteo)arthritis 01/01/2013  . IGT (impaired glucose tolerance) 02/15/2012  . Bronchial asthma 09/30/2009  . FATIGUE 06/23/2009  . POLYP, COLON 04/08/2008  . Hyperlipidemia LDL goal <100 04/08/2008    Past Surgical History:  Procedure Laterality Date    . COLONOSCOPY  03/21/2007   Dr. Jonny Ruiz diverticula, normal rectum  . COLONOSCOPY  05/04/2012   Procedure: COLONOSCOPY;  Surgeon: Daneil Dolin, MD;  [facility];  Service: Endoscopy;  Laterality: N/A;  7:30  . PROSTATE BIOPSY         Home Medications    Prior to Admission medications   Medication Sig Start Date End Date Taking? Authorizing Provider  albuterol (PROVENTIL) (2.5 MG/3ML) 0.083% nebulizer solution INHALE THE CONTENTS OF 1 VIAL VIA NEBULIZER TWO TO THREE TIMES DAILY 09/22/17  Yes Fayrene Helper, MD  albuterol (VENTOLIN HFA) 108 (90 Base) MCG/ACT inhaler Inhale 2 puffs into the lungs every 6 (six) hours as needed for wheezing or shortness of breath. 09/22/17  Yes Fayrene Helper, MD  aspirin EC 81 MG tablet Take 81 mg by mouth daily.   Yes [provider]  ergocalciferol (VITAMIN D2) 50000 units capsule Take 1 capsule (50,000 Units total) by mouth once a week. One capsule once weekly Patient taking differently: Take 50,000 Units by mouth every Monday. One capsule once weekly 09/22/17  Yes Fayrene Helper, MD  Fluticasone-Salmeterol (ADVAIR) 250-50 MCG/DOSE AEPB Inhale 1 puff into the lungs 2 (two) times daily. 09/22/17  Yes Fayrene Helper, MD  ipratropium (ATROVENT) 0.02 % nebulizer solution USE ONE VIAL IN NEBULIZER TWO TO THREE TIMES A DAY. 09/22/17  Yes Fayrene Helper, MD  lovastatin (MEVACOR) 10 MG tablet Take 1 tablet (10 mg total) by mouth at bedtime. 09/23/17  Yes Fayrene Helper, MD  mirtazapine (REMERON) 7.5 MG tablet Take 1 tablet (7.5 mg total) by mouth at bedtime. 09/22/17  Yes Fayrene Helper, MD  predniSONE (DELTASONE) 5 MG tablet Take 5 mg by mouth daily with breakfast.   Yes [provider]  ranitidine (ZANTAC) 150 MG tablet TAKE 1 TABLET(150 MG) BY MOUTH DAILY 09/22/17  Yes Fayrene Helper, MD  azithromycin (ZITHROMAX) 250 MG tablet Two tablets on day one then one tablet once daily for an  additional 4 days 11/28/17   Fayrene Helper, MD  ergocalciferol (VITAMIN D2) 50000 units capsule Take 1 capsule (50,000 Units total) by mouth once a week. One capsule once weekly 11/28/17   Fayrene Helper, MD  predniSONE (STERAPRED UNI-PAK 21 TAB) 5 MG (21) TBPK tablet Take 1 tablet (5 mg total) by mouth as directed. Use as directed 11/28/17   Fayrene Helper, MD  UNABLE TO FIND Nebulizer machine and tubing  DX J44.1 11/28/17   Fayrene Helper, MD    Family History Family History  Problem Relation Age of Onset  . Heart attack Father   . Alcohol abuse Mother   . Hypertension Brother   . COPD Sister     Social History Social History   Tobacco Use  . Smoking status: Former Smoker    Packs/day: 1.00    Years: 25.00    Pack years: 25.00    Last attempt to quit: 11/24/1996    Years since quitting: 21.0  . Smokeless tobacco: Never Used  Substance Use Topics  . Alcohol use: No  . Drug use: No     Allergies   Patient has no known allergies.   Review of Systems Review of Systems  Constitutional: Negative for fever.  HENT: Negative for rhinorrhea.   Respiratory: Positive for cough, shortness of breath and wheezing. Negative for hemoptysis.   Cardiovascular: Negative for leg swelling and syncope.  Gastrointestinal: Negative for abdominal pain and vomiting.  Skin: Negative for rash.  All other systems reviewed and are negative.    Physical Exam Updated Vital Signs BP 119/73   Pulse 83   Temp 98.4 F (36.9 C) (Oral)   Resp 13   SpO2 96%   Physical Exam  Constitutional: He is oriented to person, place, and time. He appears well-developed and well-nourished.  HENT:  Head: Normocephalic and atraumatic.  Eyes: Conjunctivae and EOM are normal.  Neck: Normal range of motion.  Cardiovascular: Normal rate.  Pulmonary/Chest: Effort normal. No respiratory distress. He has decreased breath sounds. He has wheezes. He has no rales.  Abdominal: Soft. He exhibits no  distension.  Musculoskeletal: Normal range of motion. He exhibits no edema or deformity.       Right lower leg: Normal.       Left lower leg: Normal.  Neurological: He is alert and oriented to person, place, and time. No cranial nerve deficit. Coordination normal.  Skin: Skin is warm and dry.  Nursing note and vitals reviewed.    ED Treatments / Results  Labs (all labs ordered are listed, but only abnormal results are displayed) Labs Reviewed  COMPREHENSIVE METABOLIC PANEL - Abnormal; Notable for the following components:      Result Value   Chloride 100 (*)    CO2 35 (*)    BUN 26 (*)    Albumin 3.1 (*)    ALT 15 (*)    All other components within normal limits  D-DIMER, QUANTITATIVE (NOT AT [facility]) -  Abnormal; Notable for the following components:   D-Dimer, Quant >20.00 (*)    All other components within normal limits  CULTURE, BLOOD (ROUTINE X 2)  CULTURE, BLOOD (ROUTINE X 2)  CULTURE, EXPECTORATED SPUTUM-ASSESSMENT  GRAM STAIN  CBC WITH DIFFERENTIAL/PLATELET  TROPONIN I  BRAIN NATRIURETIC PEPTIDE  HIV ANTIBODY (ROUTINE TESTING)  STREP PNEUMONIAE URINARY ANTIGEN  LEGIONELLA PNEUMOPHILA SEROGP 1 UR AG  BASIC METABOLIC PANEL  CBC    EKG  EKG Interpretation  Date/Time:  Monday November 28 2017 15:22:20 EST Ventricular Rate:  84 PR Interval:    QRS Duration: 151 QT Interval:  397 QTC Calculation: 470 R Axis:   -92 Text Interpretation:  Sinus rhythm Atrial premature complex RBBB and LAFB Baseline wander in lead(s) V3 V6 No significant change was found Confirmed by Merrily Pew [phone]) on 11/28/2017 4:51:20 PM       Radiology Dg Chest 2 View  Result Date: 11/28/2017 CLINICAL DATA:  Three weeks of shortness of breath. Low oxygen saturation in doctor's office today. EXAM: CHEST  2 VIEW COMPARISON:  PA and lateral chest x-ray of Mar 29, 2017. FINDINGS: The lungs remain hyperinflated. The interstitial markings are coarse. There is no alveolar infiltrate or pleural  effusion. There are bullous changes in the right apex which are stable. The heart and pulmonary vascularity are normal. There is calcification in the wall of the aortic arch. There is multilevel degenerative disc disease of the thoracic spine. IMPRESSION: Bullous emphysematous changes, stable. No pneumonia, CHF, nor other acute cardiopulmonary abnormality. Thoracic aortic atherosclerosis. Electronically Signed   By: David  Martinique M.D.   On: 11/28/2017 15:56   Ct Angio Chest Pe W And/or Wo Contrast  Result Date: 11/28/2017 CLINICAL DATA:  Shortness of breath for 3 weeks.  Low 2 sats. EXAM: CT ANGIOGRAPHY CHEST WITH CONTRAST TECHNIQUE: Multidetector CT imaging of the chest was performed using the standard protocol during bolus administration of intravenous contrast. Multiplanar CT image reconstructions and MIPs were obtained to evaluate the vascular anatomy. CONTRAST:  17mL ISOVUE-370 IOPAMIDOL (ISOVUE-370) INJECTION 76% COMPARISON:  Chest radiograph 11/28/2017 FINDINGS: Cardiovascular: Negative for pulmonary embolism. Coronary artery calcifications. Normal caliber of the thoracic aorta without dissection. Heart size is normal without significant pericardial fluid. Few atherosclerotic calcifications. Mediastinum/Nodes: Right hilar tissue is mildly prominent. Precarinal lymph node measures 1.2 cm in the short axis. Mildly prominent left hilar tissue measuring 0.8 cm in the short axis on sequence 5, image 63. Lungs/Pleura: Trachea and mainstem bronchi are patent. Paraseptal and centrilobular emphysema. Large bleb at the right lung apex. Bronchial wall thickening in the proximal right lower lobe bronchi. Multiple small cysts throughout the right lower lobe associated with some bronchiectasis. Patchy parenchymal densities in the right lower lobe with a peripheral opacity in the right lower lobe on sequence 7, image 106. Extensive parenchymal densities throughout the right lower lobe. Small amount of parenchymal  disease in the right middle lobe base. Bronchial wall thickening in the left lower lobe. Few patchy parenchymal densities associated with bronchiectasis in the left lower lobe. No large pleural effusions. Upper Abdomen: Probable cyst in the left kidney lower pole region. Atherosclerotic calcifications in the abdominal aorta. Infrarenal abdominal aorta is mildly enlarged measuring up to 3.1 cm. Musculoskeletal: No acute bone abnormality. Bridging osteophytes in the anterior thoracic spine. Disc space narrowing at cervicothoracic junction and lower cervical spine. Review of the MIP images confirms the above findings. IMPRESSION: Negative for a pulmonary embolism. Right lower lobe pneumonia. Patchy parenchymal disease in the right  lower lobe associated with bronchial wall thickening and extensive bronchiectasis. Cannot exclude mild infectious or inflammatory changes in left lower lobe and right middle lobe. Aortic Atherosclerosis (ICD10-I70.0) and Emphysema (ICD10-J43.9). Aortic aneurysm NOS (ICD10-I71.9). Partially visualized abdominal aorta measures up to 3.1 cm. Consider an abdominal ultrasound for more complete evaluation. Electronically Signed   By: Markus Daft M.D.   On: 11/28/2017 20:02    Procedures Procedures (including critical care time)  Medications Ordered in ED Medications  azithromycin (ZITHROMAX) 500 mg in dextrose 5 % 250 mL IVPB (not administered)  cefTRIAXone (ROCEPHIN) 1 g in dextrose 5 % 50 mL IVPB (not administered)  aspirin EC tablet 81 mg (not administered)  Vitamin D (Ergocalciferol) (DRISDOL) capsule 50,000 Units (not administered)  mometasone-formoterol (DULERA) 200-5 MCG/ACT inhaler 2 puff (not administered)  pravastatin (PRAVACHOL) tablet 10 mg (not administered)  mirtazapine (REMERON) tablet 7.5 mg (not administered)  pantoprazole (PROTONIX) EC tablet 40 mg (not administered)  predniSONE (DELTASONE) tablet 50 mg (not administered)  enoxaparin (LOVENOX) injection 40 mg (not  administered)  ipratropium-albuterol (DUONEB) 0.5-2.5 (3) MG/3ML nebulizer solution 3 mL (not administered)  ipratropium (ATROVENT) nebulizer solution 1 mg (1 mg Nebulization Given 11/28/17 1616)  cefTRIAXone (ROCEPHIN) 1 g in dextrose 5 % 50 mL IVPB (0 g Intravenous Stopped 11/28/17 1714)  azithromycin (ZITHROMAX) 500 mg in dextrose 5 % 250 mL IVPB (0 mg Intravenous Stopped 11/28/17 1824)  magnesium sulfate IVPB 2 g 50 mL (0 g Intravenous Stopped 11/28/17 1708)  iopamidol (ISOVUE-370) 76 % injection 100 mL (100 mLs Intravenous Contrast Given 11/28/17 1932)     Initial Impression / Assessment and Plan / ED Course  I have reviewed the triage vital signs and the nursing notes.  Pertinent labs & imaging results that were available during my care of the patient were reviewed by me and considered in my medical decision making (see chart for details).    SBO worsening recently, not improving with inhalers at home. Hypoxic at PCP. Still dyspneic here requiring 4L [place] instead of 3. Tachycardic. Likely OPD exacerbation however with slight tachycardia and progressive worsening hypoxia we will add on d-dimer.  D-dimer greater than 20, will get CT angio.  CT angio w/ e/o multifocal pneumonia. With that, age, increase in O2 requirement and COPD with persistent decreased BS and wheezing, will admit to hospital for further mangement and workup.   Final Clinical Impressions(s) / ED Diagnoses   Final diagnoses:  COPD exacerbation ([place])  Community acquired pneumonia of right lower lobe of lung ([place])  Hypoxia     Arvo Ealy, Corene Cornea, MD 11/28/17 2140

## 2017-11-28 NOTE — Progress Notes (Signed)
Subjective:   Kyle Mosley is a 83 y.o. male who presents for Medicare Annual/Subsequent preventive examination.  Review of Systems:  Patient presented with audible wheezing. He states that he was going to take a nebulizer treatment prior to leaving home, but his machine 'locked up.' I received a verbal order from Dr. Moshe Cipro to give him an albuterol treatment in the office. I have ordered a new nebulizer and added Mr. Kos to the provider schedule per Dr. Griffin Dakin request.       Objective:    Vitals: BP (!) 142/74 (BP Location: Left Arm, Patient Position: Sitting, Cuff Size: Normal)   Pulse 97   Temp 98.5 F (36.9 C)   Resp 16   Ht 6' (1.829 m)   Wt 154 lb 8 oz (70.1 kg)   SpO2 94%   BMI 20.95 kg/m   Body mass index is 20.95 kg/m.  Advanced Directives 11/28/2017 02/17/2017 02/17/2017 11/24/2016 03/04/2015 05/27/2014  Does Patient Have a Medical Advance Directive? No Yes Yes Yes No Patient does not have advance directive;Patient would like information  Type of Advance Directive - Living will Living will Healthcare Power of [place];Living will - -  Does patient want to make changes to medical advance directive? - No - Patient declined - No - Patient declined - -  Copy of [place] in Chart? - - - No - copy requested - -  Would patient like information on creating a medical advance directive? No - Patient declined - - - No - patient declined information Advance directive brochure given (Outpatient ONLY)    Tobacco Social History   Tobacco Use  Smoking Status Former Smoker  . Packs/day: 1.00  . Years: 25.00  . Pack years: 25.00  . Last attempt to quit: 11/24/1996  . Years since quitting: 21.0  Smokeless Tobacco Never Used     Counseling given: Not Answered   Clinical Intake:  Pre-visit preparation completed: Yes  Pain : No/denies pain Pain Score: 0-No pain     Diabetes: No  How often do you need to have someone help you when you read instructions,  pamphlets, or other written materials from your doctor or pharmacy?: 1 - Never  Interpreter Needed?: No     Past Medical History:  Diagnosis Date  . Asthma   . Colon polyps   . COPD (chronic obstructive pulmonary disease) ([place])    3L home O2  . Diverticulosis   . Enlarged prostate    with elevated PSA  . Hyperlipidemia   . Hypertension   . Prediabetes 2011  . Pulmonary hypertension ([place]) 2012   Past Surgical History:  Procedure Laterality Date  . COLONOSCOPY  03/21/2007   Dr. Jonny Ruiz diverticula, normal rectum  . COLONOSCOPY  05/04/2012   Procedure: COLONOSCOPY;  Surgeon: Daneil Dolin, MD;  [facility];  Service: Endoscopy;  Laterality: N/A;  7:30  . PROSTATE BIOPSY     Family History  Problem Relation Age of Onset  . Heart attack Father   . Alcohol abuse Mother   . Hypertension Brother   . COPD Sister    Social History   Socioeconomic History  . Marital status: Married    Spouse name: None  . Number of children: 3  . Years of education: None  . Highest education level: None  Social Needs  . Financial resource strain: Not hard at all  . Food insecurity - worry: Never true  . Food insecurity - inability: Never  true  . Transportation needs - medical: No  . Transportation needs - non-medical: No  Occupational History  . Occupation: retired   Tobacco Use  . Smoking status: Former Smoker    Packs/day: 1.00    Years: 25.00    Pack years: 25.00    Last attempt to quit: 11/24/1996    Years since quitting: 21.0  . Smokeless tobacco: Never Used  Substance and Sexual Activity  . Alcohol use: No  . Drug use: No  . Sexual activity: Not Currently  Other Topics Concern  . None  Social History Narrative  . None    Outpatient Encounter Medications as of 11/28/2017  Medication Sig  . albuterol (PROVENTIL) (2.5 MG/3ML) 0.083% nebulizer solution INHALE THE CONTENTS OF 1 VIAL VIA NEBULIZER TWO TO THREE TIMES DAILY  . albuterol (VENTOLIN HFA) 108 (90  Base) MCG/ACT inhaler Inhale 2 puffs into the lungs every 6 (six) hours as needed for wheezing or shortness of breath.  Marland Kitchen aspirin EC 81 MG tablet Take 81 mg by mouth daily.  . ergocalciferol (VITAMIN D2) 50000 units capsule Take 1 capsule (50,000 Units total) by mouth once a week. One capsule once weekly  . feeding supplement, ENSURE ENLIVE, (ENSURE ENLIVE) LIQD Take 237 mLs by mouth 2 (two) times daily between meals. (Patient not taking: Reported on 08/29/2017)  . Fluticasone-Salmeterol (ADVAIR) 250-50 MCG/DOSE AEPB Inhale 1 puff into the lungs 2 (two) times daily.  Marland Kitchen ipratropium (ATROVENT) 0.02 % nebulizer solution USE ONE VIAL IN NEBULIZER TWO TO THREE TIMES A DAY.  Marland Kitchen lovastatin (MEVACOR) 10 MG tablet Take 1 tablet (10 mg total) by mouth at bedtime.  . mirtazapine (REMERON) 7.5 MG tablet Take 1 tablet (7.5 mg total) by mouth at bedtime.  . predniSONE (DELTASONE) 5 MG tablet Take 1 tablet (5 mg total) by mouth daily.  . predniSONE (STERAPRED UNI-PAK 21 TAB) 5 MG (21) TBPK tablet Take 1 tablet (5 mg total) by mouth as directed. Use as directed (Patient not taking: Reported on 08/29/2017)  . ranitidine (ZANTAC) 150 MG tablet TAKE 1 TABLET(150 MG) BY MOUTH DAILY   No facility-administered encounter medications on file as of 11/28/2017.     Activities of Daily Living In your present state of health, do you have any difficulty performing the following activities: 11/28/2017 02/28/2017  Hearing? Y N  Vision? N N  Difficulty concentrating or making decisions? N N  Walking or climbing stairs? N Y  Dressing or bathing? N N  Doing errands, shopping? N Y  Conservation officer, nature and eating ? N -  Using the Toilet? N -  In the past six months, have you accidently leaked urine? N -  Do you have problems with loss of bowel control? N -  Managing your Medications? N -  Managing your Finances? N -  Housekeeping or managing your Housekeeping? N -  Some recent data might be hidden    Patient Care Team: Fayrene Helper, MD as PCP - General Rourk, Cristopher Estimable, MD (Gastroenterology) Herminio Commons, MD as Attending Physician (Cardiology) Madelin Headings, DO (Optometry) Irine Seal, MD as Attending Physician (Urology)   Assessment:   This is a routine wellness examination for Kyle Mosley.  Exercise Activities and Dietary recommendations    Goals    None      Fall Risk Fall Risk  02/28/2017 12/28/2016 11/24/2016 01/22/2016 09/10/2015  Falls in the past year? No No Yes Yes Yes  Number falls in past yr: - - 1 1 1  Comment - - patient rolled out of bed while sleeping - -  Injury with Fall? - - No - No  Risk for fall due to : - - - - -  Follow up - - Falls prevention discussed - -   Is the patient's home free of loose throw rugs in walkways, pet beds, electrical cords, etc?   yes      Grab bars in the bathroom? yes      Handrails on the stairs?   No stairs in the home      Adequate lighting?   yes    Depression Screen PHQ 2/9 Scores 11/28/2017 02/28/2017 12/28/2016 11/24/2016  PHQ - 2 Score 1 0 0 0  PHQ- 9 Score - - - -    Cognitive Function     6CIT Screen 11/24/2016  What Year? 0 points  What month? 0 points  What time? 0 points  Count back from 20 0 points  Months in reverse 2 points  Repeat phrase 0 points  Total Score 2    Immunization History  Administered Date(s) Administered  . H1N1 09/17/2008  . Influenza Split 08/13/2014  . Influenza Whole 09/06/2007, 09/04/2008, 07/31/2009, 08/07/2010  . Influenza,inj,Quad PF,6+ Mos 09/10/2015, 07/13/2016, 08/29/2017  . Pneumococcal Conjugate-13 05/27/2014  . Pneumococcal Polysaccharide-23 04/05/2004  . Td 07/20/2005  . Zoster 02/15/2012    Qualifies for Shingles Vaccine? Yes  Screening Tests Health Maintenance  Topic Date Due  . TETANUS/TDAP  07/21/2015  . COLONOSCOPY  10/05/2018 (Originally 05/04/2017)  . INFLUENZA VACCINE  Completed  . PNA vac Low Risk Adult  Completed   Cancer Screenings: Lung: Low Dose CT Chest recommended if Age  42-80 years, 30 pack-year currently smoking OR have quit w/in 15years. Patient does not qualify. Colorectal: Due for colonoscopy per last report- will discuss with PCP   Plan:     I have personally reviewed and noted the following in the patient's chart:   . Medical and social history . Use of alcohol, tobacco or illicit drugs  . Current medications and supplements . Functional ability and status . Nutritional status . Physical activity . Advanced directives . List of other physicians . Hospitalizations, surgeries, and ER visits in previous 12 months . Vitals . Screenings to include cognitive, depression, and falls . Referrals and appointments  In addition, I have reviewed and discussed with patient certain preventive protocols, quality metrics, and best practice recommendations. A written personalized care plan for preventive services as well as general preventive health recommendations were provided to patient.     Merceda Elks, LPN  01/25/92

## 2017-11-28 NOTE — ED Notes (Signed)
Called Respiratory for neb tx

## 2017-11-28 NOTE — ED Triage Notes (Signed)
Pt brought over by Dr Moshe Cipro due to SOB for 3 weeks and low O2 sats in office. Pt was given albuterol neb in office and depomedrol 80 IM. O2 at 3 L/min per reports sats had gotten to 79 %

## 2017-11-28 NOTE — ED Notes (Signed)
Waiting for lab to draw blood cultures 

## 2017-11-28 NOTE — Patient Instructions (Addendum)
F/u in March as before, call if you need me sooner  You are treated  Today for COPD flare that you report over the past 3 weeks  Breathing treatment, depo medrol 80 mg IM in office  Prednisone dose pack and  Azithromycin are sent to your local pharmacy  6 months of once weekly vitamin D are sent to  [place] Is sent to your local supplier [place]

## 2017-11-29 ENCOUNTER — Encounter: Payer: Self-pay | Admitting: Family Medicine

## 2017-11-29 ENCOUNTER — Observation Stay (HOSPITAL_COMMUNITY): Payer: Medicare HMO

## 2017-11-29 DIAGNOSIS — J441 Chronic obstructive pulmonary disease with (acute) exacerbation: Secondary | ICD-10-CM | POA: Diagnosis not present

## 2017-11-29 DIAGNOSIS — E43 Unspecified severe protein-calorie malnutrition: Secondary | ICD-10-CM

## 2017-11-29 DIAGNOSIS — E785 Hyperlipidemia, unspecified: Secondary | ICD-10-CM

## 2017-11-29 DIAGNOSIS — I714 Abdominal aortic aneurysm, without rupture: Secondary | ICD-10-CM

## 2017-11-29 DIAGNOSIS — I272 Pulmonary hypertension, unspecified: Secondary | ICD-10-CM | POA: Diagnosis not present

## 2017-11-29 DIAGNOSIS — J9621 Acute and chronic respiratory failure with hypoxia: Secondary | ICD-10-CM | POA: Diagnosis not present

## 2017-11-29 DIAGNOSIS — I719 Aortic aneurysm of unspecified site, without rupture: Secondary | ICD-10-CM

## 2017-11-29 LAB — CBC
HEMATOCRIT: 45.5 % (ref 39.0–52.0)
Hemoglobin: 13.7 g/dL (ref 13.0–17.0)
MCH: 29.5 pg (ref 26.0–34.0)
MCHC: 30.1 g/dL (ref 30.0–36.0)
MCV: 98.1 fL (ref 78.0–100.0)
PLATELETS: 231 10*3/uL (ref 150–400)
RBC: 4.64 MIL/uL (ref 4.22–5.81)
RDW: 13.7 % (ref 11.5–15.5)
WBC: 6.8 10*3/uL (ref 4.0–10.5)

## 2017-11-29 LAB — RESPIRATORY PANEL BY PCR
Adenovirus: NOT DETECTED
Bordetella pertussis: NOT DETECTED
CORONAVIRUS 229E-RVPPCR: NOT DETECTED
CORONAVIRUS HKU1-RVPPCR: NOT DETECTED
CORONAVIRUS OC43-RVPPCR: NOT DETECTED
Chlamydophila pneumoniae: NOT DETECTED
Coronavirus NL63: NOT DETECTED
INFLUENZA B-RVPPCR: NOT DETECTED
Influenza A: NOT DETECTED
MYCOPLASMA PNEUMONIAE-RVPPCR: NOT DETECTED
Metapneumovirus: NOT DETECTED
PARAINFLUENZA VIRUS 1-RVPPCR: NOT DETECTED
Parainfluenza Virus 2: NOT DETECTED
Parainfluenza Virus 3: NOT DETECTED
Parainfluenza Virus 4: NOT DETECTED
RESPIRATORY SYNCYTIAL VIRUS-RVPPCR: NOT DETECTED
Rhinovirus / Enterovirus: NOT DETECTED

## 2017-11-29 LAB — BASIC METABOLIC PANEL
Anion gap: 9 (ref 5–15)
BUN: 21 mg/dL — AB (ref 6–20)
CALCIUM: 8.4 mg/dL — AB (ref 8.9–10.3)
CO2: 32 mmol/L (ref 22–32)
CREATININE: 0.71 mg/dL (ref 0.61–1.24)
Chloride: 98 mmol/L — ABNORMAL LOW (ref 101–111)
GFR calc Af Amer: >60 mL/min (ref >60)
GLUCOSE: 93 mg/dL (ref 65–99)
Potassium: 4.1 mmol/L (ref 3.5–5.1)
SODIUM: 139 mmol/L (ref 135–145)

## 2017-11-29 LAB — PROCALCITONIN

## 2017-11-29 LAB — STREP PNEUMONIAE URINARY ANTIGEN: STREP PNEUMO URINARY ANTIGEN: NEGATIVE

## 2017-11-29 MED ORDER — METHYLPREDNISOLONE ACETATE 80 MG/ML IJ SUSP
80.0000 mg | Freq: Once | INTRAMUSCULAR | Status: DC
  Administered 2017-11-29: 80 mg via INTRAMUSCULAR

## 2017-11-29 MED ORDER — BUDESONIDE 0.5 MG/2ML IN SUSP
0.5000 mg | Freq: Two times a day (BID) | RESPIRATORY_TRACT | Status: DC
  Administered 2017-11-29 – 2017-11-30 (×3): 0.5 mg via RESPIRATORY_TRACT
  Filled 2017-11-29 (×3): qty 2

## 2017-11-29 MED ORDER — AZITHROMYCIN 250 MG PO TABS
500.0000 mg | ORAL_TABLET | Freq: Every day | ORAL | Status: DC
  Administered 2017-11-29 – 2017-11-30 (×2): 500 mg via ORAL
  Filled 2017-11-29 (×2): qty 2

## 2017-11-29 MED ORDER — IPRATROPIUM-ALBUTEROL 0.5-2.5 (3) MG/3ML IN SOLN
3.0000 mL | Freq: Three times a day (TID) | RESPIRATORY_TRACT | Status: DC
  Administered 2017-11-30 (×2): 3 mL via RESPIRATORY_TRACT
  Filled 2017-11-29 (×2): qty 3

## 2017-11-29 MED ORDER — METHYLPREDNISOLONE SODIUM SUCC 125 MG IJ SOLR
60.0000 mg | Freq: Three times a day (TID) | INTRAMUSCULAR | Status: DC
  Administered 2017-11-29 – 2017-11-30 (×5): 60 mg via INTRAVENOUS
  Filled 2017-11-29 (×5): qty 2

## 2017-11-29 NOTE — Assessment & Plan Note (Signed)
Neb treatment and depo medrol 80 mg Im administered in office , no response , clinical deterioration as far as pulse ox witnessed, transported to ED for further E/M

## 2017-11-29 NOTE — Evaluation (Signed)
Occupational Therapy Evaluation Patient Details Name: Kyle Mosley MRN: [id number] DOB: 1935/02/04 Today's Date: 11/29/2017    History of Present Illness [name] is a 83 y.o. male with medical history significant for hypertension, dyslipidemia, pulmonary hypertension, impaired fasting glucose, benign prostatic hypertrophy, and COPD with chronic hypoxemia on 3 L home oxygen who presented to his primary care physician's office earlier today for routine checkup and discussed how he was short of breath over the last 3 weeks.  At that time pulse oximetry was checked demonstrating saturation of 79% on his home 3 L nasal cannula.  He was given an albuterol nebulized treatment as well as Depo-Medrol 80 mg IM and was driven to the emergency department by his primary care provider for further evaluation.  Patient denies any recent fever, chills, chest tightness, wheezing, or weight loss.  He states that he has shortness of breath usually as his baseline and did not feel that he had to come to the hospital for further evaluation.  He denies chest pain or any other symptomatology at this time.   Clinical Impression   Pt received supine in bed, agreeable to OT evaluation. Pt performing all ADLs at modified independent/supervision level this am. Pt practicing PLB with activity wearing 3L O2 throughout session. Pt reports he is feeling much improved with activity tolerance and SOB this am. Pt is at baseline with ADL completion, no further OT services required at this time.     Follow Up Recommendations  No OT follow up    Equipment Recommendations  None recommended by OT       Precautions / Restrictions Precautions Precautions: None Restrictions Weight Bearing Restrictions: No      Mobility Bed Mobility Overal bed mobility: Modified Independent                Transfers Overall transfer level: Needs assistance Equipment used: Straight cane Transfers: Sit to/from Stand Sit to Stand:  Supervision                  ADL either performed or assessed with clinical judgement   ADL Overall ADL's : Needs assistance/impaired     Grooming: Wash/dry hands;Standing;Supervision/safety                   Toilet Transfer: Sales executive;Ambulation(SPC)   Toileting- Clothing Manipulation and Hygiene: Supervision/safety;Sit to/from stand       Functional mobility during ADLs: Supervision/safety;Cane       Vision Baseline Vision/History: No visual deficits Patient Visual Report: No change from baseline Vision Assessment?: No apparent visual deficits            Pertinent Vitals/Pain Pain Assessment: No/denies pain     Hand Dominance Right   Extremity/Trunk Assessment Upper Extremity Assessment Upper Extremity Assessment: Overall WFL for tasks assessed   Lower Extremity Assessment Lower Extremity Assessment: Defer to PT evaluation   Cervical / Trunk Assessment Cervical / Trunk Assessment: Normal   Communication Communication Communication: No difficulties   Cognition Arousal/Alertness: Awake/alert Behavior During Therapy: WFL for tasks assessed/performed Overall Cognitive Status: Within Functional Limits for tasks assessed                                                Home Living Family/patient expects to be discharged to:: Private residence Living Arrangements: Spouse/significant other Available Help at Discharge: Family;Available 24 hours/day  Type of Home: House Home Access: Stairs to enter [employer] of Steps: 4 Entrance Stairs-Rails: Right Home Layout: One level     Bathroom Shower/Tub: Teacher, early years/pre: Standard     Home Equipment: Cane - single point;Walker - 2 wheels;Shower seat;Bedside commode;Grab bars - toilet;Grab bars - tub/shower;Wheelchair - manual          Prior Functioning/Environment Level of Independence: Independent with assistive device(s)         Comments: Pt using cane for functional mobility, pt is independent in ADL completion. Drives.         OT Problem List: Cardiopulmonary status limiting activity       End of Session Equipment Utilized During Treatment: Oxygen;Other (comment)(SPC)  Activity Tolerance: Patient tolerated treatment well Patient left: in bed;with call bell/phone within reach  OT Visit Diagnosis: Muscle weakness (generalized) (M62.81)                Time: 2449-7530 OT Time Calculation (min): 18 min Charges:  OT General Charges $OT Visit: 1 Visit OT Evaluation $OT Eval Low Complexity: 1 Low    Guadelupe Sabin, OTR/L  [phone] 11/29/2017, 7:59 AM

## 2017-11-29 NOTE — Care Management Obs Status (Signed)
[place] NOTIFICATION   Patient Details  Name: Kyle Mosley MRN: [id number] Date of Birth: Mar 28, 1935   Medicare Observation Status Notification Given:  Yes    Sherald Barge, RN 11/29/2017, 11:10 AM

## 2017-11-29 NOTE — Progress Notes (Signed)
   LAURIE LOVEJOY     MRN: [id number]      DOB: 04/05/1935   HPI Mr. Headley is here with 3 week h/o shortness of breath and green sputum with chills, stated at his AWV that his neb n machine stopped working the day he came in, he was noted to be wheezing and SOB and I then also evaluated him After a breathing treatment and depo medrol 80 mg IM his pulse ox was 79%, on 3 L Oxygen, I took him to the Ed for further evaluation and management as he had no one with him or who could come to get him  ROS . Denies sinus pressure, nasal congestion, ear pain or sore throat.  Denies chest pains, palpitations and leg swelling Denies abdominal pain, nausea, vomiting,diarrhea or constipation.   Denies dysuria, frequency, hesitancy or incontinence. C/o chronic  joint pain, g and limitation in mobility. Denies headaches, seizures, numbness, or tingling. Denies depression, anxiety or insomnia. Denies skin break down or rash.   PE  BP (!) 142/74   Pulse 80   Resp 16   Ht 6' (1.829 m)   Wt 154 lb (69.9 kg)   SpO2 (!) 84%   BMI 20.89 kg/m   Patient alert and oriented and in  cardiopulmonary distress.Chronically ill appearing  HEENT: No facial asymmetry, EOMI,   oropharynx pink and moist.  Neck decreased ROM no JVD, no mass.  Chest: Decreased air entry throughout .  CVS: S1, S2 no murmurs, no S3.Regular rate.  ABD: Soft non tender.   Ext: No edema  TT:[id number]  ROM spine, shoulders, hips and knees.  Skin: Intact, no ulcerations or rash noted.  Psych: Good eye contact, normal affect. Memory intact not anxious or depressed appearing.  CNS: CN 2-12 intact, .no focal deficits noted.   Assessment & Plan  Acute on chronic respiratory failure with hypoxia (HCC) Acute hypoxia with h/o chills, sputum and progressive dyspnea x 3 weeks. No response tpo neb treatment, saturation actually deteriorated as com[pared to what was origin;lly documented at time of presentation. Transported to ED for  eval and management . Depo medrol 80 mg IM administered. Originally I thought out patient treatment would suffice and had prescribed Z pack and prednisone dose pack , however with persistently hypoxia , further treatment was deemed necessary  COPD exacerbation ([place]) Neb treatment and depo medrol 80 mg Im administered in office , no response , clinical deterioration as far as pulse ox witnessed, transported to ED for further E/M

## 2017-11-29 NOTE — Assessment & Plan Note (Signed)
Acute hypoxia with h/o chills, sputum and progressive dyspnea x 3 weeks. No response tpo neb treatment, saturation actually deteriorated as com[pared to what was origin;lly documented at time of presentation. Transported to ED for eval and management . Depo medrol 80 mg IM administered. Originally I thought out patient treatment would suffice and had prescribed Z pack and prednisone dose pack , however with persistently hypoxia , further treatment was deemed necessary

## 2017-11-29 NOTE — Care Management Note (Signed)
Case Management Note  Patient Details  Name: Kyle Mosley MRN: [id number] Date of Birth: December 17, 1934  Subjective/Objective:       Admitted with resp failure. Pt is from home, lives with his wife. He is ind with ADL;s. Has PCP, insurance with access to medications and drives himself in the community. Pt has home oxygen and neb machine. He uses cane with ambulation. PT recommends no f/u. Pt on [facility] registry but not active. This is pt's only admission in last 6 months.              Action/Plan: Pt will DC home with self care. He has no HH or DME needs this admission. CM will sign off and may be re-consulted if needs arise.  Expected Discharge Date:    11/30/2016              Expected Discharge Plan:  Home/Self Care  In-House Referral:  NA  Discharge planning Services  CM Consult  Post Acute Care Choice:  NA Choice offered to:  NA  Status of Service:  Completed, signed off  Sherald Barge, RN 11/29/2017, 11:11 AM

## 2017-11-29 NOTE — Evaluation (Signed)
Physical Therapy Evaluation Patient Details Name: Kyle Mosley MRN: [id number] DOB: 05-Jan-1935 Today's Date: 11/29/2017   History of Present Illness  Kyle Mosley is a 83 y.o. male with medical history significant for hypertension, dyslipidemia, pulmonary hypertension, impaired fasting glucose, benign prostatic hypertrophy, and COPD with chronic hypoxemia on 3 L home oxygen who presented to his primary care physician's office earlier today for routine checkup and discussed how he was short of breath over the last 3 weeks.  At that time pulse oximetry was checked demonstrating saturation of 79% on his home 3 L nasal cannula.  He was given an albuterol nebulized treatment as well as Depo-Medrol 80 mg IM and was driven to the emergency department by his primary care provider for further evaluation.  Patient denies any recent fever, chills, chest tightness, wheezing, or weight loss.  He states that he has shortness of breath usually as his baseline and did not feel that he had to come to the hospital for further evaluation.  He denies chest pain or any other symptomatology at this time.    Clinical Impression  Patient functioning at baseline for functional mobility and gait.  Patient discharged from physical therapy to care of nursing staff for ambulation daily as tolerated for length of stay.    Follow Up Recommendations No PT follow up    Equipment Recommendations  None recommended by PT    Recommendations for Other Services       Precautions / Restrictions Precautions Precautions: None Restrictions Weight Bearing Restrictions: No      Mobility  Bed Mobility Overal bed mobility: Modified Independent                Transfers Overall transfer level: Modified independent Equipment used: Straight cane                Ambulation/Gait Ambulation/Gait assistance: Modified independent (Device/Increase time) Ambulation Distance (Feet): 65 Feet Assistive device: Straight  cane Gait Pattern/deviations: Step-through pattern;WFL(Within Functional Limits)   Gait velocity interpretation: at or above normal speed for age/gender General Gait Details: Patient demonstrates 2 point gait pattern using SPC without loss of balance while on 3 LPM with O2 sats between 90-91 %  Stairs            Wheelchair Mobility    Modified Rankin (Stroke Patients Only)       Balance Overall balance assessment: No apparent balance deficits (not formally assessed)                                           Pertinent Vitals/Pain Pain Assessment: No/denies pain    Home Living Family/patient expects to be discharged to:: Private residence Living Arrangements: Spouse/significant other Available Help at Discharge: Family;Available 24 hours/day Type of Home: House Home Access: Stairs to enter Entrance Stairs-Rails: Right Entrance Stairs-Number of Steps: 4 Home Layout: Multi-level Home Equipment: Cane - single point;Walker - 2 wheels;Shower seat;Bedside commode;Grab bars - toilet;Grab bars - tub/shower;Wheelchair - manual Additional Comments: Patient's spouse states she is physically limited herself and unable to help much    Prior Function Level of Independence: Independent with assistive device(s)         Comments: Pt using cane for functional mobility, pt is independent in ADL completion. Drives.      Hand Dominance   Dominant Hand: Right    Extremity/Trunk Assessment   Upper Extremity Assessment Upper  Extremity Assessment: Defer to OT evaluation    Lower Extremity Assessment Lower Extremity Assessment: Overall WFL for tasks assessed    Cervical / Trunk Assessment Cervical / Trunk Assessment: Normal  Communication   Communication: No difficulties  Cognition Arousal/Alertness: Awake/alert Behavior During Therapy: WFL for tasks assessed/performed Overall Cognitive Status: Within Functional Limits for tasks assessed                                         General Comments      Exercises     Assessment/Plan    PT Assessment Patent does not need any further PT services  PT Problem List         PT Treatment Interventions      PT Goals (Current goals can be found in the Care Plan section)  Acute Rehab PT Goals Patient Stated Goal: return home PT Goal Formulation: With patient/family Time For Goal Achievement: Dec 16, 2017 Potential to Achieve Goals: Good    Frequency     Barriers to discharge        Co-evaluation               AM-PAC PT "6 Clicks" Daily Activity  Outcome Measure Difficulty turning over in bed (including adjusting bedclothes, sheets and blankets)?: None Difficulty moving from lying on back to sitting on the side of the bed? : None Difficulty sitting down on and standing up from a chair with arms (e.g., wheelchair, bedside commode, etc,.)?: None Help needed moving to and from a bed to chair (including a wheelchair)?: None Help needed walking in hospital room?: None Help needed climbing 3-5 steps with a railing? : None 6 Click Score: 24    End of Session   Activity Tolerance: Patient tolerated treatment well;Patient limited by fatigue Patient left: in bed;with call bell/phone within reach;with family/visitor present(seated at bedside) Nurse Communication: Mobility status PT Visit Diagnosis: Unsteadiness on feet (R26.81);Other abnormalities of gait and mobility (R26.89);Muscle weakness (generalized) (M62.81)    Time: 1423-9532 PT Time Calculation (min) (ACUTE ONLY): 18 min   Charges:   PT Evaluation $PT Eval Low Complexity: 1 Low PT Treatments $Therapeutic Activity: 8-22 mins   PT G Codes:        11:58 AM, 12-16-17 Lonell Grandchild, MPT Physical Therapist with [facility] [phone] office [phone] mobile phone

## 2017-11-29 NOTE — Addendum Note (Signed)
Addended by: Eual Fines on: 11/29/2017 08:06 AM   Modules accepted: Orders

## 2017-11-29 NOTE — Progress Notes (Signed)
Initial Nutrition Assessment  DOCUMENTATION CODES:  Severe malnutrition in context of chronic illness  INTERVENTION:  Ensure Enlive po BID, each supplement provides 350 kcal and 20 grams of protein  Magic cup BID with meals, each supplement provides 290 kcal and 9 grams of protein  NUTRITION DIAGNOSIS:  Severe Malnutrition related to poor appetite, catabolic illness(Severe COPD) as evidenced by severe muscle/fat depletion  GOAL:  Patient will meet greater than or equal to 90% of their needs  MONITOR:  PO intake, Supplement acceptance, Labs, Weight trends  REASON FOR ASSESSMENT:  Malnutrition Screening Tool    ASSESSMENT:  83 y/o male w/ PMHx HTN, HLD, COPD (chronic 3L o2). Referred to ED from PCP appointment after he was found to have 02 saturation of 79% on 3L O2. Had reported SOB x3 weeks. Work up revealed RLL PNA and aortic aneurism. Admitted for management.   When asked how patient has been eating, he says "50-50". He explains sometimes he eats well, and other times not so much. His wife, who is at bedside, gives more objective information: pt eats a small breakfast (oatmeal or grits), snack for lunch and then a large dinner. Pt states his decreased appetite has gone on for 6 months and he believes he had been eating better before that.   At baseline he drinks 1 Ensure/day and takes Vit D.  Denies any n/v/c/d.   Weight-wise, patient says he feels like he has lost a lot of weight. He says he has gone from a size 8 to a size 6. He thinks he has lost 20 lbs x 6 months. Per Chart, patient was 158 lbs 3 months ago, 163 lbs 7 months ago and 173 lbs 12 months ago. None of these amounts are clinically significant for their timeframes.   However, he does meet severe malnutrition criteria due to physical exam. He has quite severe upper body muscle and fat wasting. He also has BLE edema, L>R. He says he has this in the mornings.   At this time, the patient is agreeable to nutritional  supplements while he is admitted.  RD stressed need for increased intake when he returns him and to eat throughout the day. He says "you havent told me anything my wife doesn't tell me". Encouraged him to follow his wife's direction.   Labs: Albumin: 3.1,  Meds: PO abx, Methylprednisolone, Ensure Enlive BID, ppi, Vit D, Remeron, IV Abx   Recent Labs  Lab 11/28/17 1539 11/29/17 0520  NA 144 139  K 4.7 4.1  CL 100* 98*  CO2 35* 32  BUN 26* 21*  CREATININE 0.87 0.71  CALCIUM 9.1 8.4*  GLUCOSE 98 93   NUTRITION - FOCUSED PHYSICAL EXAM:   Most Recent Value  Orbital Region  Moderate depletion  Upper Arm Region  Severe depletion  Thoracic and Lumbar Region  Severe depletion  Buccal Region  Mild depletion  Temple Region  Severe depletion  Clavicle Bone Region  Severe depletion  Clavicle and Acromion Bone Region  Severe depletion  Scapular Bone Region  Unable to assess  Dorsal Hand  Moderate depletion  Patellar Region  Unable to assess  Anterior Thigh Region  Unable to assess  Edema (RD Assessment)  Moderate     Diet Order:  Diet Heart Room service appropriate? Yes; Fluid consistency: Thin  EDUCATION NEEDS:  No education needs have been identified at this time  Skin:  Skin Assessment: Reviewed RN Assessment  Last BM:  1/7  Height:  Ht Readings from  Last 1 Encounters:  11/28/17 6' (1.829 m)   Weight:  Wt Readings from Last 1 Encounters:  11/28/17 153 lb 3.2 oz (69.5 kg)   Wt Readings from Last 10 Encounters:  11/28/17 153 lb 3.2 oz (69.5 kg)  11/28/17 154 lb (69.9 kg)  11/28/17 154 lb 8 oz (70.1 kg)  08/29/17 158 lb (71.7 kg)  05/02/17 163 lb (73.9 kg)  02/28/17 166 lb 1.3 oz (75.3 kg)  02/17/17 161 lb 9.6 oz (73.3 kg)  12/28/16 164 lb (74.4 kg)  11/24/16 173 lb 1.3 oz (78.5 kg)  07/13/16 169 lb (76.7 kg)   Ideal Body Weight:  80.91 kg  BMI:  Body mass index is 20.78 kg/m.  Estimated Nutritional Needs:  Kcal:  2100-2300 kcals (30-33 kcal/kg bw) Protein:   90-105 (1.3-1.5 g/kg bw) Fluid:  2.1-2.3 L fluid (1 ml/kcal)  Burtis Junes RD, LDN, CNSC Clinical Nutrition Pager: [phone] 11/29/2017 12:03 PM

## 2017-11-29 NOTE — Progress Notes (Signed)
PROGRESS NOTE  Kyle Mosley OAC:[id number] DOB: 1935-05-17 DOA: 11/28/2017 PCP: Fayrene Helper, MD  Brief History:  83 year old male with a history of hypertension, COPD, chronic respiratory failure on 3 L, so on CPAP, hyperlipidemia, and impaired glucose tolerance presenting with 3 week history of worsening shortness of breath and nonproductive cough. The patient denies any fevers, chills, chest pain, nausea, vomiting, diarrhea, headache, sore throat.  Apparently, his nebulizer machine has not been working properly for a few days.  He went to see his primary care provider on 11/28/2017.  He was noted to have oxygen saturation of 79% in the office.  He was given intramuscular steroids, and subsequently driven to the emergency department for further evaluation.  CTA chest was negative for pulmonary embolus but showed centrilobular emphysema with right lower lobe bronchiectasis and parenchymal opacity.  There is also a small parenchymal opacity in the right middle lobe and left lower lobe bronchial wall thickening.  The patient was started on bronchodilators and steroids.    Assessment/Plan: COPD exacerbation/bronchiectasis -Start IV Solu-Medrol -Start Pulmicort -Continue duo nebs  Parenchymal infiltrates -Clinically not consistent with pneumonia -He is afebrile hemodynamically stable -Infiltrates may be related to his underlying bronchiectasis -Procalcitonin -Respiratory viral panel -Continue ceftriaxone and azithromycin pending above studies  Acute on chronic respiratory failure with hypoxia -On 3 L at baseline -Pulmonary hygiene -Wean oxygen back to baseline  Hypertension -holding HCTZ and monitor clinically -BP acceptable throughout hospitalization -will not restart after d/c -follow up with PCP   Hyperlipidemia -Continue statin  Impaired glucose tolerance -02/18/17 Hemoglobin A1c--5.8 -NovoLog sliding scale -Anticipate elevated CBGs secondary to  steroids   Severe Malnutrition -continue supplements   Disposition Plan:   Home in 1-2 days  Family Communication:   Spouse updated at bedside  Consultants:  none  Code Status:  FULL   DVT Prophylaxis:  [place] Lovenox   Procedures: As Listed in Progress Note Above  Antibiotics: Ceftriaxone 1/7>>> azithro 1/7>>>    Subjective: He has been better than yesterday but still has a cough without any blood.  Denies any fevers, chills, chest pain, nausea or vomiting, diarrhea, abdominal pain, dysuria, hematuria.  Still has dyspnea with mild exertion.  Objective: Vitals:   11/28/17 2206 11/29/17 0126 11/29/17 0127 11/29/17 0500  BP: (!) 154/85   129/74  Pulse: 85  71 79  Resp: 18  18 16   Temp: 98.2 F (36.8 C)   98.7 F (37.1 C)  TempSrc:    Oral  SpO2: 96% 92% 92% 95%  Weight: 69.5 kg (153 lb 3.2 oz)     Height: 6' (1.829 m)       Intake/Output Summary (Last 24 hours) at 11/29/2017 0853 Last data filed at 11/28/2017 1708 Gross per 24 hour  Intake 50 ml  Output -  Net 50 ml   Weight change:  Exam:   General:  Pt is alert, follows commands appropriately, not in acute distress  HEENT: No icterus, No thrush, No neck mass, [place]/AT  Cardiovascular: RRR, S1/S2, no rubs, no gallops  Respiratory: Bibasilar crackles, left greater than right.  No wheezing.  Abdomen: Soft/+BS, non tender, non distended, no guarding  Extremities: trace LE edema, No lymphangitis, No petechiae, No rashes, no synovitis   Data Reviewed: I have personally reviewed following labs and imaging studies Basic Metabolic Panel: Recent Labs  Lab 11/28/17 1539 11/29/17 0520  NA 144 139  K 4.7 4.1  CL 100* 98*  CO2 35* 32  GLUCOSE 98 93  BUN 26* 21*  CREATININE 0.87 0.71  CALCIUM 9.1 8.4*   Liver Function Tests: Recent Labs  Lab 11/28/17 1539  AST 24  ALT 15*  ALKPHOS 77  BILITOT 0.5  PROT 7.4  ALBUMIN 3.1*   No results for input(s): LIPASE, AMYLASE in the last 168 hours. No results  for input(s): AMMONIA in the last 168 hours. Coagulation Profile: No results for input(s): INR, PROTIME in the last 168 hours. CBC: Recent Labs  Lab 11/28/17 1539 11/29/17 0520  WBC 7.3 6.8  NEUTROABS 5.6  --   HGB 14.7 13.7  HCT 47.7 45.5  MCV 97.5 98.1  PLT 256 231   Cardiac Enzymes: Recent Labs  Lab 11/28/17 1539  TROPONINI <0.03   BNP: Invalid input(s): POCBNP CBG: No results for input(s): GLUCAP in the last 168 hours. HbA1C: No results for input(s): HGBA1C in the last 72 hours. Urine analysis: No results found for: COLORURINE, APPEARANCEUR, LABSPEC, PHURINE, GLUCOSEU, HGBUR, BILIRUBINUR, KETONESUR, PROTEINUR, UROBILINOGEN, NITRITE, LEUKOCYTESUR Sepsis Labs: @LABRCNTIP (procalcitonin:4,lacticidven:4) ) Recent Results (from the past 240 hour(s))  Blood culture (routine x 2)     Status: None (Preliminary result)   Collection Time: 11/28/17  4:24 PM  Result Value Ref Range Status   Specimen Description BLOOD RIGHT HAND  Final   Special Requests   Final    BOTTLES DRAWN AEROBIC AND ANAEROBIC Blood Culture adequate volume   Culture NO GROWTH < 24 HOURS  Final   Report Status PENDING  Incomplete  Blood culture (routine x 2)     Status: None (Preliminary result)   Collection Time: 11/28/17  4:24 PM  Result Value Ref Range Status   Specimen Description RIGHT ANTECUBITAL  Final   Special Requests   Final    BOTTLES DRAWN AEROBIC AND ANAEROBIC Blood Culture adequate volume   Culture NO GROWTH < 24 HOURS  Final   Report Status PENDING  Incomplete     Scheduled Meds: . aspirin EC  81 mg Oral Daily  . enoxaparin (LOVENOX) injection  40 mg Subcutaneous Q24H  . feeding supplement (ENSURE ENLIVE)  237 mL Oral BID BM  . ipratropium-albuterol  3 mL Nebulization Q6H  . mouth rinse  15 mL Mouth Rinse BID  . mirtazapine  7.5 mg Oral QHS  . mometasone-formoterol  2 puff Inhalation BID  . pantoprazole  40 mg Oral Daily  . pravastatin  10 mg Oral q1800  . predniSONE  50 mg  Oral Q breakfast  . [START ON 12/05/2017] Vitamin D (Ergocalciferol)  50,000 Units Oral Q Mon   Continuous Infusions: . azithromycin (ZITHROMAX) 500 MG IVPB    . cefTRIAXone (ROCEPHIN) IVPB 1 gram/50 mL D5W      Procedures/Studies: Dg Chest 2 View  Result Date: 11/28/2017 CLINICAL DATA:  Three weeks of shortness of breath. Low oxygen saturation in doctor's office today. EXAM: CHEST  2 VIEW COMPARISON:  PA and lateral chest x-ray of Mar 29, 2017. FINDINGS: The lungs remain hyperinflated. The interstitial markings are coarse. There is no alveolar infiltrate or pleural effusion. There are bullous changes in the right apex which are stable. The heart and pulmonary vascularity are normal. There is calcification in the wall of the aortic arch. There is multilevel degenerative disc disease of the thoracic spine. IMPRESSION: Bullous emphysematous changes, stable. No pneumonia, CHF, nor other acute cardiopulmonary abnormality. Thoracic aortic atherosclerosis. Electronically Signed   By: Noga Fogg  [place] M.D.   On: 11/28/2017 15:56  Ct Angio Chest Pe W And/or Wo Contrast  Result Date: 11/28/2017 CLINICAL DATA:  Shortness of breath for 3 weeks.  Low 2 sats. EXAM: CT ANGIOGRAPHY CHEST WITH CONTRAST TECHNIQUE: Multidetector CT imaging of the chest was performed using the standard protocol during bolus administration of intravenous contrast. Multiplanar CT image reconstructions and MIPs were obtained to evaluate the vascular anatomy. CONTRAST:  131mL ISOVUE-370 IOPAMIDOL (ISOVUE-370) INJECTION 76% COMPARISON:  Chest radiograph 11/28/2017 FINDINGS: Cardiovascular: Negative for pulmonary embolism. Coronary artery calcifications. Normal caliber of the thoracic aorta without dissection. Heart size is normal without significant pericardial fluid. Few atherosclerotic calcifications. Mediastinum/Nodes: Right hilar tissue is mildly prominent. Precarinal lymph node measures 1.2 cm in the short axis. Mildly prominent left hilar  tissue measuring 0.8 cm in the short axis on sequence 5, image 63. Lungs/Pleura: Trachea and mainstem bronchi are patent. Paraseptal and centrilobular emphysema. Large bleb at the right lung apex. Bronchial wall thickening in the proximal right lower lobe bronchi. Multiple small cysts throughout the right lower lobe associated with some bronchiectasis. Patchy parenchymal densities in the right lower lobe with a peripheral opacity in the right lower lobe on sequence 7, image 106. Extensive parenchymal densities throughout the right lower lobe. Small amount of parenchymal disease in the right middle lobe base. Bronchial wall thickening in the left lower lobe. Few patchy parenchymal densities associated with bronchiectasis in the left lower lobe. No large pleural effusions. Upper Abdomen: Probable cyst in the left kidney lower pole region. Atherosclerotic calcifications in the abdominal aorta. Infrarenal abdominal aorta is mildly enlarged measuring up to 3.1 cm. Musculoskeletal: No acute bone abnormality. Bridging osteophytes in the anterior thoracic spine. Disc space narrowing at cervicothoracic junction and lower cervical spine. Review of the MIP images confirms the above findings. IMPRESSION: Negative for a pulmonary embolism. Right lower lobe pneumonia. Patchy parenchymal disease in the right lower lobe associated with bronchial wall thickening and extensive bronchiectasis. Cannot exclude mild infectious or inflammatory changes in left lower lobe and right middle lobe. Aortic Atherosclerosis (ICD10-I70.0) and Emphysema (ICD10-J43.9). Aortic aneurysm NOS (ICD10-I71.9). Partially visualized abdominal aorta measures up to 3.1 cm. Consider an abdominal ultrasound for more complete evaluation. Electronically Signed   By: Markus Daft M.D.   On: 11/28/2017 20:02    Orson Eva, DO  [facility] Pager [phone]  If 7PM-7AM, please contact night-coverage [email or web address] Password [email or web address] 11/29/2017, 8:53 AM   LOS:  0 days

## 2017-11-30 ENCOUNTER — Ambulatory Visit: Payer: Medicare HMO

## 2017-11-30 ENCOUNTER — Observation Stay (HOSPITAL_BASED_OUTPATIENT_CLINIC_OR_DEPARTMENT_OTHER): Payer: Medicare HMO

## 2017-11-30 DIAGNOSIS — I714 Abdominal aortic aneurysm, without rupture: Secondary | ICD-10-CM | POA: Diagnosis not present

## 2017-11-30 DIAGNOSIS — I272 Pulmonary hypertension, unspecified: Secondary | ICD-10-CM | POA: Diagnosis not present

## 2017-11-30 DIAGNOSIS — I429 Cardiomyopathy, unspecified: Secondary | ICD-10-CM

## 2017-11-30 DIAGNOSIS — E43 Unspecified severe protein-calorie malnutrition: Secondary | ICD-10-CM | POA: Diagnosis not present

## 2017-11-30 DIAGNOSIS — J441 Chronic obstructive pulmonary disease with (acute) exacerbation: Secondary | ICD-10-CM | POA: Diagnosis not present

## 2017-11-30 DIAGNOSIS — E785 Hyperlipidemia, unspecified: Secondary | ICD-10-CM | POA: Diagnosis not present

## 2017-11-30 DIAGNOSIS — J9621 Acute and chronic respiratory failure with hypoxia: Secondary | ICD-10-CM | POA: Diagnosis not present

## 2017-11-30 LAB — ECHOCARDIOGRAM COMPLETE
CHL CUP MV DEC (S): 296
E decel time: 296 msec
EERAT: 8.36
FS: 29 % (ref 28–44)
HEIGHTINCHES: 72 in
IVS/LV PW RATIO, ED: 0.99
LA ID, A-P, ES: 31 mm
LA diam index: 1.66 cm/m2
LAVOL: 52 mL
LAVOLA4C: 46.1 mL
LAVOLIN: 27.8 mL/m2
LDCA: 3.14 cm2
LEFT ATRIUM END SYS DIAM: 31 mm
LV E/e'average: 8.36
LV PW d: 11.7 mm — AB (ref 0.6–1.1)
LV dias vol index: 60 mL/m2
LV dias vol: 113 mL (ref 62–150)
LV e' LATERAL: 8.92 cm/s
LVEEMED: 8.36
LVOT SV: 91 mL
LVOT VTI: 29 cm
LVOT diameter: 20 mm
LVOT peak grad rest: 6 mmHg
LVOT peak vel: 126 cm/s
MV Peak grad: 2 mmHg
MVPKAVEL: 105 m/s
MVPKEVEL: 74.6 m/s
RV LATERAL S' VELOCITY: 11.9 cm/s
TAPSE: 20.3 mm
TDI e' lateral: 8.92
TDI e' medial: 6.53
WEIGHTICAEL: 2451.2 [oz_av]

## 2017-11-30 LAB — HIV ANTIBODY (ROUTINE TESTING W REFLEX): HIV Screen 4th Generation wRfx: NONREACTIVE

## 2017-11-30 MED ORDER — PREDNISONE 10 MG PO TABS: 60.0000 mg | ORAL_TABLET | Freq: Every day | ORAL | 0 refills | Status: DC

## 2017-11-30 MED ORDER — ENSURE ENLIVE PO LIQD: 237.0000 mL | Freq: Two times a day (BID) | ORAL | 0 refills | Status: DC

## 2017-11-30 MED ORDER — PREDNISONE 20 MG PO TABS: 60.0000 mg | ORAL_TABLET | Freq: Every day | ORAL | Status: DC

## 2017-11-30 NOTE — Progress Notes (Signed)
*  PRELIMINARY RESULTS* Echocardiogram 2D Echocardiogram has been performed.  Kyle Mosley 11/30/2017, 3:39 PM

## 2017-11-30 NOTE — Discharge Summary (Signed)
Physician Discharge Summary  Kyle Mosley PXT:[id number] DOB: [birth year] DOA: 11/28/2017  PCP: Fayrene Helper, MD  Admit date: 11/28/2017 Discharge date: 11/30/2017  Admitted From: Home Disposition:  Home  Recommendations for Outpatient Follow-up:  1. Follow up with PCP in 1-2 weeks 2. Please obtain BMP/CBC in one week 3. Please follow up on the following pending results:    Discharge Condition: Stable CODE STATUS: FULL Diet recommendation: Heart Healthy    Brief/Interim Summary: 83 year old male with a history of hypertension, COPD, chronic respiratory failure on 3 L, so on CPAP, hyperlipidemia, and impaired glucose tolerance presenting with 3 week history of worsening shortness of breath and nonproductive cough. The patient denies any fevers, chills, chest pain, nausea, vomiting, diarrhea, headache, sore throat.  Apparently, his nebulizer machine has not been working properly for a few days.  He went to see his primary care provider on 11/28/2017.  He was noted to have oxygen saturation of 79% in the office.  He was given intramuscular steroids, and subsequently driven to the emergency department for further evaluation.  CTA chest was negative for pulmonary embolus but showed centrilobular emphysema with right lower lobe bronchiectasis and parenchymal opacity.  There is also a small parenchymal opacity in the right middle lobe and left lower lobe bronchial wall thickening.  The patient was started on bronchodilators and steroids.    Discharge Diagnoses:  COPD exacerbation/bronchiectasis -Start IV Solu-Medrol>>>home with prednisone taper over one week -Started Pulmicort -Continue duo nebs  Parenchymal infiltrates -Clinically not consistent with pneumonia -He is afebrile hemodynamically stable -Infiltrates related to his underlying bronchiectasis -Procalcitonin <0.10 -Respiratory viral panel--neg -d/c ceftriaxone -finished 3 days azithromycin during the  hospitalization  Acute on chronic respiratory failure with hypoxia -On 3 L at baseline -Pulmonary hygiene -Wean oxygen back to baseline  Hypertension -holding HCTZ and monitor clinically -BP acceptablethroughout hospitalization -will not restart after d/c -follow up with PCP  Hyperlipidemia -Continue statin  Impaired glucose tolerance -02/18/17 Hemoglobin A1c--5.8 -NovoLog sliding scale -Anticipate elevated CBGs secondary to steroids  Severe Malnutrition -continue Ensure  RBBB -unchanged from prior EKGs   Discharge Instructions  Discharge Instructions    Diet - low sodium heart healthy   Complete by:  As directed    Increase activity slowly   Complete by:  As directed      Allergies as of 11/30/2017   No Known Allergies     Medication List    STOP taking these medications   azithromycin 250 MG tablet Commonly known as:  ZITHROMAX     TAKE these medications   albuterol 108 (90 Base) MCG/ACT inhaler Commonly known as:  VENTOLIN HFA Inhale 2 puffs into the lungs every 6 (six) hours as needed for wheezing or shortness of breath.   albuterol (2.5 MG/3ML) 0.083% nebulizer solution Commonly known as:  PROVENTIL INHALE THE CONTENTS OF 1 VIAL VIA NEBULIZER TWO TO THREE TIMES DAILY   aspirin EC 81 MG tablet Take 81 mg by mouth daily.   ergocalciferol 50000 units capsule Commonly known as:  VITAMIN D2 Take 1 capsule (50,000 Units total) by mouth once a week. One capsule once weekly What changed:  Another medication with the same name was removed. Continue taking this medication, and follow the directions you see here.   feeding supplement (ENSURE ENLIVE) Liqd Take 237 mLs by mouth 2 (two) times daily between meals.   Fluticasone-Salmeterol 250-50 MCG/DOSE Aepb Commonly known as:  ADVAIR Inhale 1 puff into the lungs 2 (two) times daily.  ipratropium 0.02 % nebulizer solution Commonly known as:  ATROVENT USE ONE VIAL IN NEBULIZER TWO TO THREE TIMES A  DAY.   lovastatin 10 MG tablet Commonly known as:  MEVACOR Take 1 tablet (10 mg total) by mouth at bedtime.   mirtazapine 7.5 MG tablet Commonly known as:  REMERON Take 1 tablet (7.5 mg total) by mouth at bedtime.   predniSONE 10 MG tablet Commonly known as:  DELTASONE Take 6 tablets (60 mg total) by mouth daily with breakfast. And decrease by one tablet daily Start taking on:  12/01/2017 What changed:    medication strength  how much to take  additional instructions  Another medication with the same name was removed. Continue taking this medication, and follow the directions you see here.   ranitidine 150 MG tablet Commonly known as:  ZANTAC TAKE 1 TABLET(150 MG) BY MOUTH DAILY   UNABLE TO FIND Nebulizer machine and tubing  DX J44.1       No Known Allergies  Consultations:  none   Procedures/Studies: Dg Chest 2 View  Result Date: 11/28/2017 CLINICAL DATA:  Three weeks of shortness of breath. Low oxygen saturation in doctor's office today. EXAM: CHEST  2 VIEW COMPARISON:  PA and lateral chest x-ray of Mar 29, 2017. FINDINGS: The lungs remain hyperinflated. The interstitial markings are coarse. There is no alveolar infiltrate or pleural effusion. There are bullous changes in the right apex which are stable. The heart and pulmonary vascularity are normal. There is calcification in the wall of the aortic arch. There is multilevel degenerative disc disease of the thoracic spine. IMPRESSION: Bullous emphysematous changes, stable. No pneumonia, CHF, nor other acute cardiopulmonary abnormality. Thoracic aortic atherosclerosis. Electronically Signed   By: Alexsandria Kivett  [place] M.D.   On: 11/28/2017 15:56   Ct Angio Chest Pe W And/or Wo Contrast  Result Date: 11/28/2017 CLINICAL DATA:  Shortness of breath for 3 weeks.  Low 2 sats. EXAM: CT ANGIOGRAPHY CHEST WITH CONTRAST TECHNIQUE: Multidetector CT imaging of the chest was performed using the standard protocol during bolus administration  of intravenous contrast. Multiplanar CT image reconstructions and MIPs were obtained to evaluate the vascular anatomy. CONTRAST:  154mL ISOVUE-370 IOPAMIDOL (ISOVUE-370) INJECTION 76% COMPARISON:  Chest radiograph 11/28/2017 FINDINGS: Cardiovascular: Negative for pulmonary embolism. Coronary artery calcifications. Normal caliber of the thoracic aorta without dissection. Heart size is normal without significant pericardial fluid. Few atherosclerotic calcifications. Mediastinum/Nodes: Right hilar tissue is mildly prominent. Precarinal lymph node measures 1.2 cm in the short axis. Mildly prominent left hilar tissue measuring 0.8 cm in the short axis on sequence 5, image 63. Lungs/Pleura: Trachea and mainstem bronchi are patent. Paraseptal and centrilobular emphysema. Large bleb at the right lung apex. Bronchial wall thickening in the proximal right lower lobe bronchi. Multiple small cysts throughout the right lower lobe associated with some bronchiectasis. Patchy parenchymal densities in the right lower lobe with a peripheral opacity in the right lower lobe on sequence 7, image 106. Extensive parenchymal densities throughout the right lower lobe. Small amount of parenchymal disease in the right middle lobe base. Bronchial wall thickening in the left lower lobe. Few patchy parenchymal densities associated with bronchiectasis in the left lower lobe. No large pleural effusions. Upper Abdomen: Probable cyst in the left kidney lower pole region. Atherosclerotic calcifications in the abdominal aorta. Infrarenal abdominal aorta is mildly enlarged measuring up to 3.1 cm. Musculoskeletal: No acute bone abnormality. Bridging osteophytes in the anterior thoracic spine. Disc space narrowing at cervicothoracic junction and lower cervical spine.  Review of the MIP images confirms the above findings. IMPRESSION: Negative for a pulmonary embolism. Right lower lobe pneumonia. Patchy parenchymal disease in the right lower lobe associated  with bronchial wall thickening and extensive bronchiectasis. Cannot exclude mild infectious or inflammatory changes in left lower lobe and right middle lobe. Aortic Atherosclerosis (ICD10-I70.0) and Emphysema (ICD10-J43.9). Aortic aneurysm NOS (ICD10-I71.9). Partially visualized abdominal aorta measures up to 3.1 cm. Consider an abdominal ultrasound for more complete evaluation. Electronically Signed   By: Markus Daft M.D.   On: 11/28/2017 20:02   [employer]  Result Date: 11/29/2017 CLINICAL DATA:  Abnormal aorta noted on CT. EXAM: ULTRASOUND OF ABDOMINAL AORTA TECHNIQUE: Ultrasound examination of the abdominal aorta was performed to evaluate for abdominal aortic aneurysm. COMPARISON:  CT chest 11/28/2016 FINDINGS: Abdominal aortic measurements as follows: Proximal:  3.3 cm Mid:  2.5 cm Distal:  3.8 cm Proximal common iliac arteries show diameter of 2.2 cm on the right and 2 cm on the left. IMPRESSION: Atherosclerosis of the aorta. Infrarenal aneurysm with maximal transverse diameter of 3.8 cm. Recommend followup by ultrasound in 2 years. This recommendation follows ACR consensus guidelines: White Paper of the ACR Incidental Findings Committee II on Vascular Findings. J Am Coll Radiol 2013; 10:789-794. Electronically Signed   By: Nelson Chimes M.D.   On: 11/29/2017 12:44        Discharge Exam: Vitals:   11/30/17 1404 11/30/17 1514  BP:    Pulse:  71  Resp:  18  Temp:  98.4 F (36.9 C)  SpO2: 97% 98%   Vitals:   11/30/17 0557 11/30/17 0737 11/30/17 1404 11/30/17 1514  BP: (!) 147/84     Pulse: 72   71  Resp: 17   18  Temp: (!) 97.5 F (36.4 C)   98.4 F (36.9 C)  TempSrc: Oral   Oral  SpO2: 95% 96% 97% 98%  Weight:      Height:        General: Pt is alert, awake, not in acute distress Cardiovascular: RRR, S1/S2 +, no rubs, no gallops Respiratory: CTA bilaterally, no wheezing, no rhonchi Abdominal: Soft, NT, ND, bowel sounds + Extremities: no edema, no cyanosis   The  results of significant diagnostics from this hospitalization (including imaging, microbiology, ancillary and laboratory) are listed below for reference.    Significant Diagnostic Studies: Dg Chest 2 View  Result Date: 11/28/2017 CLINICAL DATA:  Three weeks of shortness of breath. Low oxygen saturation in doctor's office today. EXAM: CHEST  2 VIEW COMPARISON:  PA and lateral chest x-ray of Mar 29, 2017. FINDINGS: The lungs remain hyperinflated. The interstitial markings are coarse. There is no alveolar infiltrate or pleural effusion. There are bullous changes in the right apex which are stable. The heart and pulmonary vascularity are normal. There is calcification in the wall of the aortic arch. There is multilevel degenerative disc disease of the thoracic spine. IMPRESSION: Bullous emphysematous changes, stable. No pneumonia, CHF, nor other acute cardiopulmonary abnormality. Thoracic aortic atherosclerosis. Electronically Signed   By: Genevieve Arbaugh  [place] M.D.   On: 11/28/2017 15:56   Ct Angio Chest Pe W And/or Wo Contrast  Result Date: 11/28/2017 CLINICAL DATA:  Shortness of breath for 3 weeks.  Low 2 sats. EXAM: CT ANGIOGRAPHY CHEST WITH CONTRAST TECHNIQUE: Multidetector CT imaging of the chest was performed using the standard protocol during bolus administration of intravenous contrast. Multiplanar CT image reconstructions and MIPs were obtained to evaluate the vascular anatomy. CONTRAST:  149mL ISOVUE-370 IOPAMIDOL (  ISOVUE-370) INJECTION 76% COMPARISON:  Chest radiograph 11/28/2017 FINDINGS: Cardiovascular: Negative for pulmonary embolism. Coronary artery calcifications. Normal caliber of the thoracic aorta without dissection. Heart size is normal without significant pericardial fluid. Few atherosclerotic calcifications. Mediastinum/Nodes: Right hilar tissue is mildly prominent. Precarinal lymph node measures 1.2 cm in the short axis. Mildly prominent left hilar tissue measuring 0.8 cm in the short axis on  sequence 5, image 63. Lungs/Pleura: Trachea and mainstem bronchi are patent. Paraseptal and centrilobular emphysema. Large bleb at the right lung apex. Bronchial wall thickening in the proximal right lower lobe bronchi. Multiple small cysts throughout the right lower lobe associated with some bronchiectasis. Patchy parenchymal densities in the right lower lobe with a peripheral opacity in the right lower lobe on sequence 7, image 106. Extensive parenchymal densities throughout the right lower lobe. Small amount of parenchymal disease in the right middle lobe base. Bronchial wall thickening in the left lower lobe. Few patchy parenchymal densities associated with bronchiectasis in the left lower lobe. No large pleural effusions. Upper Abdomen: Probable cyst in the left kidney lower pole region. Atherosclerotic calcifications in the abdominal aorta. Infrarenal abdominal aorta is mildly enlarged measuring up to 3.1 cm. Musculoskeletal: No acute bone abnormality. Bridging osteophytes in the anterior thoracic spine. Disc space narrowing at cervicothoracic junction and lower cervical spine. Review of the MIP images confirms the above findings. IMPRESSION: Negative for a pulmonary embolism. Right lower lobe pneumonia. Patchy parenchymal disease in the right lower lobe associated with bronchial wall thickening and extensive bronchiectasis. Cannot exclude mild infectious or inflammatory changes in left lower lobe and right middle lobe. Aortic Atherosclerosis (ICD10-I70.0) and Emphysema (ICD10-J43.9). Aortic aneurysm NOS (ICD10-I71.9). Partially visualized abdominal aorta measures up to 3.1 cm. Consider an abdominal ultrasound for more complete evaluation. Electronically Signed   By: Markus Daft M.D.   On: 11/28/2017 20:02   [employer]  Result Date: 11/29/2017 CLINICAL DATA:  Abnormal aorta noted on CT. EXAM: ULTRASOUND OF ABDOMINAL AORTA TECHNIQUE: Ultrasound examination of the abdominal aorta was performed to  evaluate for abdominal aortic aneurysm. COMPARISON:  CT chest 11/28/2016 FINDINGS: Abdominal aortic measurements as follows: Proximal:  3.3 cm Mid:  2.5 cm Distal:  3.8 cm Proximal common iliac arteries show diameter of 2.2 cm on the right and 2 cm on the left. IMPRESSION: Atherosclerosis of the aorta. Infrarenal aneurysm with maximal transverse diameter of 3.8 cm. Recommend followup by ultrasound in 2 years. This recommendation follows ACR consensus guidelines: White Paper of the ACR Incidental Findings Committee II on Vascular Findings. J Am Coll Radiol 2013; 10:789-794. Electronically Signed   By: Nelson Chimes M.D.   On: 11/29/2017 12:44     Microbiology: Recent Results (from the past 240 hour(s))  Blood culture (routine x 2)     Status: None (Preliminary result)   Collection Time: 11/28/17  4:24 PM  Result Value Ref Range Status   Specimen Description BLOOD RIGHT HAND  Final   Special Requests   Final    BOTTLES DRAWN AEROBIC AND ANAEROBIC Blood Culture adequate volume   Culture NO GROWTH 2 DAYS  Final   Report Status PENDING  Incomplete  Blood culture (routine x 2)     Status: None (Preliminary result)   Collection Time: 11/28/17  4:24 PM  Result Value Ref Range Status   Specimen Description RIGHT ANTECUBITAL  Final   Special Requests   Final    BOTTLES DRAWN AEROBIC AND ANAEROBIC Blood Culture adequate volume   Culture NO GROWTH 2  DAYS  Final   Report Status PENDING  Incomplete  Respiratory Panel by PCR     Status: None   Collection Time: 11/29/17  9:40 AM  Result Value Ref Range Status   Adenovirus NOT DETECTED NOT DETECTED Final   Coronavirus 229E NOT DETECTED NOT DETECTED Final   Coronavirus HKU1 NOT DETECTED NOT DETECTED Final   Coronavirus NL63 NOT DETECTED NOT DETECTED Final   Coronavirus OC43 NOT DETECTED NOT DETECTED Final   Metapneumovirus NOT DETECTED NOT DETECTED Final   Rhinovirus / Enterovirus NOT DETECTED NOT DETECTED Final   Influenza A NOT DETECTED NOT DETECTED  Final   Influenza B NOT DETECTED NOT DETECTED Final   Parainfluenza Virus 1 NOT DETECTED NOT DETECTED Final   Parainfluenza Virus 2 NOT DETECTED NOT DETECTED Final   Parainfluenza Virus 3 NOT DETECTED NOT DETECTED Final   Parainfluenza Virus 4 NOT DETECTED NOT DETECTED Final   Respiratory Syncytial Virus NOT DETECTED NOT DETECTED Final   Bordetella pertussis NOT DETECTED NOT DETECTED Final   Chlamydophila pneumoniae NOT DETECTED NOT DETECTED Final   Mycoplasma pneumoniae NOT DETECTED NOT DETECTED Final     Labs: Basic Metabolic Panel: Recent Labs  Lab 11/28/17 1539 11/29/17 0520  NA 144 139  K 4.7 4.1  CL 100* 98*  CO2 35* 32  GLUCOSE 98 93  BUN 26* 21*  CREATININE 0.87 0.71  CALCIUM 9.1 8.4*   Liver Function Tests: Recent Labs  Lab 11/28/17 1539  AST 24  ALT 15*  ALKPHOS 77  BILITOT 0.5  PROT 7.4  ALBUMIN 3.1*   No results for input(s): LIPASE, AMYLASE in the last 168 hours. No results for input(s): AMMONIA in the last 168 hours. CBC: Recent Labs  Lab 11/28/17 1539 11/29/17 0520  WBC 7.3 6.8  NEUTROABS 5.6  --   HGB 14.7 13.7  HCT 47.7 45.5  MCV 97.5 98.1  PLT 256 231   Cardiac Enzymes: Recent Labs  Lab 11/28/17 1539  TROPONINI <0.03   BNP: Invalid input(s): POCBNP CBG: No results for input(s): GLUCAP in the last 168 hours.  Time coordinating discharge:  Greater than 30 minutes  Signed:  Orson Eva, DO [facility] Pager: [phone] 11/30/2017, 3:21 PM

## 2017-12-01 LAB — LEGIONELLA PNEUMOPHILA SEROGP 1 UR AG: L. PNEUMOPHILA SEROGP 1 UR AG: NEGATIVE

## 2017-12-02 ENCOUNTER — Telehealth: Payer: Self-pay | Admitting: Family Medicine

## 2017-12-02 NOTE — Telephone Encounter (Signed)
-----   Message from Fayrene Helper, MD sent at 12/02/2017  1:38 PM EST ----- Regarding: transitional call pls  D/cc on 1/11 needs 1 to 2 week f/u per d/c summary also please ( not sure who is available  this pm so I sent it  to both of you)

## 2017-12-02 NOTE — Telephone Encounter (Signed)
Called patient regarding message below. No answer, unable to leave message.  

## 2017-12-03 DIAGNOSIS — J449 Chronic obstructive pulmonary disease, unspecified: Secondary | ICD-10-CM | POA: Diagnosis not present

## 2017-12-03 DIAGNOSIS — E785 Hyperlipidemia, unspecified: Secondary | ICD-10-CM | POA: Diagnosis not present

## 2017-12-03 LAB — CULTURE, BLOOD (ROUTINE X 2)
CULTURE: NO GROWTH
Culture: NO GROWTH
Special Requests: ADEQUATE
Special Requests: ADEQUATE

## 2017-12-09 NOTE — Telephone Encounter (Signed)
Patient states he is feeling much better. Attempted to make a transitional follow up appt and he did not wish to schedule. Said if he started feeling bad and needed to come in that he would call and schedule

## 2017-12-29 ENCOUNTER — Other Ambulatory Visit: Payer: Self-pay | Admitting: Family Medicine

## 2018-01-03 DIAGNOSIS — E785 Hyperlipidemia, unspecified: Secondary | ICD-10-CM | POA: Diagnosis not present

## 2018-01-03 DIAGNOSIS — J449 Chronic obstructive pulmonary disease, unspecified: Secondary | ICD-10-CM | POA: Diagnosis not present

## 2018-01-31 DIAGNOSIS — J449 Chronic obstructive pulmonary disease, unspecified: Secondary | ICD-10-CM | POA: Diagnosis not present

## 2018-01-31 DIAGNOSIS — E785 Hyperlipidemia, unspecified: Secondary | ICD-10-CM | POA: Diagnosis not present

## 2018-02-09 ENCOUNTER — Ambulatory Visit: Payer: Medicare HMO | Admitting: Family Medicine

## 2018-02-09 ENCOUNTER — Encounter: Payer: Self-pay | Admitting: Family Medicine

## 2018-02-09 VITALS — BP 142/92 | HR 75 | Resp 14 | Ht 72.0 in | Wt 158.0 lb

## 2018-02-09 DIAGNOSIS — E559 Vitamin D deficiency, unspecified: Secondary | ICD-10-CM | POA: Diagnosis not present

## 2018-02-09 DIAGNOSIS — J441 Chronic obstructive pulmonary disease with (acute) exacerbation: Secondary | ICD-10-CM | POA: Diagnosis not present

## 2018-02-09 DIAGNOSIS — M150 Primary generalized (osteo)arthritis: Secondary | ICD-10-CM | POA: Diagnosis not present

## 2018-02-09 DIAGNOSIS — E785 Hyperlipidemia, unspecified: Secondary | ICD-10-CM | POA: Diagnosis not present

## 2018-02-09 DIAGNOSIS — E43 Unspecified severe protein-calorie malnutrition: Secondary | ICD-10-CM | POA: Diagnosis not present

## 2018-02-09 MED ORDER — ALBUTEROL SULFATE HFA 108 (90 BASE) MCG/ACT IN AERS: 2.0000 | INHALATION_SPRAY | Freq: Four times a day (QID) | RESPIRATORY_TRACT | 1 refills | Status: AC | PRN

## 2018-02-09 MED ORDER — PREDNISONE 5 MG (21) PO TBPK: ORAL_TABLET | ORAL | 0 refills | Status: DC

## 2018-02-09 MED ORDER — METHYLPREDNISOLONE ACETATE 80 MG/ML IJ SUSP
80.0000 mg | Freq: Once | INTRAMUSCULAR | Status: AC
Start: 2018-02-09 — End: 2018-02-09
  Administered 2018-02-09: 80 mg via INTRAMUSCULAR

## 2018-02-09 NOTE — Assessment & Plan Note (Signed)
depomedrol 80 m g IM and prednisone dose pack

## 2018-02-09 NOTE — Patient Instructions (Signed)
F/u in 22months, call if you need me sooner  STOP aspirin  Starting today take prednisone 1 tablet 3 times daily for next 3 days, then one tablet twice daily for 3 days, then go back to once daily   I have sent script to [facility]  Depo medrol in the office today for breathing

## 2018-02-12 ENCOUNTER — Encounter: Payer: Self-pay | Admitting: Family Medicine

## 2018-02-12 DIAGNOSIS — E559 Vitamin D deficiency, unspecified: Secondary | ICD-10-CM | POA: Insufficient documentation

## 2018-02-12 NOTE — Assessment & Plan Note (Signed)
Hyperlipidemia:Low fat diet discussed and encouraged.   Lipid Panel  Lab Results  Component Value Date   CHOL 127 09/22/2017   HDL 86 09/22/2017   LDLCALC 30 09/22/2017   TRIG 38 09/22/2017   CHOLHDL 1.5 09/22/2017

## 2018-02-12 NOTE — Progress Notes (Signed)
   [name]     MRN: [id number]      DOB: 03-15-1935   HPI [name] is here for follow up and re-evaluation of chronic medical conditions, medication management and review of any available recent lab and radiology data.  Preventive health is updated, specifically  Cancer screening and Immunization.    There are no new concerns.  There are no specific complaints , has chronic shortness of breath, states slightly worse in past 1 week states medication has helped with sleep and appetitie ROS Denies recent fever or chills. Denies sinus pressure, nasal congestion, ear pain or sore throat.  Denies chest pains, palpitations and leg swelling Denies abdominal pain, nausea, vomiting,diarrhea or constipation.   Denies dysuria, frequency, hesitancy or incontinence. C/o chronic  joint pain, and limitation in mobility. Denies headaches, seizures, numbness, or tingling. Denies depression, anxiety or insomnia. Denies skin break down or rash.c/o bruising   PE  BP (!) 142/92   Pulse 75   Resp 14   Ht 6' (1.829 m)   Wt 158 lb (71.7 kg)   SpO2 90% Comment: 3 liters oxygen  BMI 21.43 kg/m   Patient alert and oriented and in  cardiopulmonary distress.Dyspneic at rest on supplemental oxygen  HEENT: No facial asymmetry, EOMI,   oropharynx pink and moist.  Neck decreased ROM  no JVD, no mass.  Chest: Bilateral wheezes with poor air entry  CVS: S1, S2 no murmurs, no S3.Regular rate.  ABD: Soft non tender.   Ext: No edema  GQ:[id number] ROM spine, shoulders, hips and knees.  Skin: Intact, no ulcerations or rash noted.  Psych: Good eye contact, normal affect. Memory impaired  not anxious or depressed appearing.  CNS: CN 2-12 intact, power,  normal throughout.no focal deficits noted.   Assessment & Plan  COPD exacerbation ([place]) depomedrol 80 m g IM and prednisone dose pack  Primary generalized (osteo)arthritis Denies falls since last visit, home safety reviewed as well as need to  be careful outside of his  Home as well and to use an assistive device  Protein-calorie malnutrition, severe Weight gain with Remeron, hopefully albumin also improved  Hyperlipidemia LDL goal <100 Hyperlipidemia:Low fat diet discussed and encouraged.   Lipid Panel  Lab Results  Component Value Date   CHOL 127 09/22/2017   HDL 86 09/22/2017   LDLCALC 30 09/22/2017   TRIG 38 09/22/2017   CHOLHDL 1.5 09/22/2017       Vitamin D deficiency Weekly supplement for at least 6 months

## 2018-02-12 NOTE — Assessment & Plan Note (Signed)
Weekly supplement for at least 6 months

## 2018-02-12 NOTE — Assessment & Plan Note (Signed)
Weight gain with Remeron, hopefully albumin also improved

## 2018-02-12 NOTE — Assessment & Plan Note (Signed)
Denies falls since last visit, home safety reviewed as well as need to be careful outside of his  Home as well and to use an assistive device

## 2018-02-13 ENCOUNTER — Other Ambulatory Visit: Payer: Self-pay | Admitting: Family Medicine

## 2018-03-03 DIAGNOSIS — E785 Hyperlipidemia, unspecified: Secondary | ICD-10-CM | POA: Diagnosis not present

## 2018-03-03 DIAGNOSIS — J449 Chronic obstructive pulmonary disease, unspecified: Secondary | ICD-10-CM | POA: Diagnosis not present

## 2018-04-02 DIAGNOSIS — J449 Chronic obstructive pulmonary disease, unspecified: Secondary | ICD-10-CM | POA: Diagnosis not present

## 2018-04-02 DIAGNOSIS — E785 Hyperlipidemia, unspecified: Secondary | ICD-10-CM | POA: Diagnosis not present

## 2018-04-19 ENCOUNTER — Other Ambulatory Visit: Payer: Self-pay | Admitting: Family Medicine

## 2018-04-19 DIAGNOSIS — F32A Depression, unspecified: Secondary | ICD-10-CM

## 2018-04-19 DIAGNOSIS — F329 Major depressive disorder, single episode, unspecified: Secondary | ICD-10-CM

## 2018-05-03 DIAGNOSIS — J449 Chronic obstructive pulmonary disease, unspecified: Secondary | ICD-10-CM | POA: Diagnosis not present

## 2018-05-03 DIAGNOSIS — E785 Hyperlipidemia, unspecified: Secondary | ICD-10-CM | POA: Diagnosis not present

## 2018-05-05 ENCOUNTER — Other Ambulatory Visit: Payer: Self-pay | Admitting: Family Medicine

## 2018-05-09 ENCOUNTER — Telehealth: Payer: Self-pay | Admitting: Family Medicine

## 2018-05-09 NOTE — Telephone Encounter (Signed)
This was done yesterday.  

## 2018-05-09 NOTE — Telephone Encounter (Signed)
Please refill his Vit D with the "90 day plan" you used last time.

## 2018-05-29 ENCOUNTER — Other Ambulatory Visit: Payer: Self-pay | Admitting: Family Medicine

## 2018-06-02 DIAGNOSIS — E785 Hyperlipidemia, unspecified: Secondary | ICD-10-CM | POA: Diagnosis not present

## 2018-06-02 DIAGNOSIS — J449 Chronic obstructive pulmonary disease, unspecified: Secondary | ICD-10-CM | POA: Diagnosis not present

## 2018-06-13 ENCOUNTER — Emergency Department (HOSPITAL_COMMUNITY): Payer: Medicare HMO

## 2018-06-13 ENCOUNTER — Other Ambulatory Visit: Payer: Self-pay

## 2018-06-13 ENCOUNTER — Observation Stay (HOSPITAL_COMMUNITY)
Admission: EM | Admit: 2018-06-13 | Discharge: 2018-06-14 | Disposition: A | Discharge status: Home / Self Care | Payer: Medicare HMO | Attending: Internal Medicine | Admitting: Internal Medicine

## 2018-06-13 ENCOUNTER — Encounter: Payer: Self-pay | Admitting: Family Medicine

## 2018-06-13 ENCOUNTER — Ambulatory Visit: Payer: Medicare HMO | Admitting: Family Medicine

## 2018-06-13 ENCOUNTER — Encounter (HOSPITAL_COMMUNITY): Payer: Self-pay

## 2018-06-13 VITALS — BP 142/86 | HR 78 | Resp 36 | Ht 72.0 in

## 2018-06-13 DIAGNOSIS — I10 Essential (primary) hypertension: Secondary | ICD-10-CM | POA: Insufficient documentation

## 2018-06-13 DIAGNOSIS — R609 Edema, unspecified: Secondary | ICD-10-CM

## 2018-06-13 DIAGNOSIS — R062 Wheezing: Secondary | ICD-10-CM | POA: Diagnosis not present

## 2018-06-13 DIAGNOSIS — I714 Abdominal aortic aneurysm, without rupture: Secondary | ICD-10-CM | POA: Diagnosis not present

## 2018-06-13 DIAGNOSIS — J9621 Acute and chronic respiratory failure with hypoxia: Secondary | ICD-10-CM | POA: Diagnosis not present

## 2018-06-13 DIAGNOSIS — I719 Aortic aneurysm of unspecified site, without rupture: Secondary | ICD-10-CM | POA: Diagnosis present

## 2018-06-13 DIAGNOSIS — R0902 Hypoxemia: Secondary | ICD-10-CM

## 2018-06-13 DIAGNOSIS — R0602 Shortness of breath: Secondary | ICD-10-CM | POA: Diagnosis not present

## 2018-06-13 DIAGNOSIS — Z87891 Personal history of nicotine dependence: Secondary | ICD-10-CM | POA: Diagnosis not present

## 2018-06-13 DIAGNOSIS — J441 Chronic obstructive pulmonary disease with (acute) exacerbation: Principal | ICD-10-CM | POA: Diagnosis present

## 2018-06-13 DIAGNOSIS — E785 Hyperlipidemia, unspecified: Secondary | ICD-10-CM | POA: Diagnosis not present

## 2018-06-13 LAB — COMPREHENSIVE METABOLIC PANEL
ALK PHOS: 69 U/L (ref 38–126)
ALT: 14 U/L (ref 0–44)
AST: 16 U/L (ref 15–41)
Albumin: 2.9 g/dL — ABNORMAL LOW (ref 3.5–5.0)
Anion gap: 3 — ABNORMAL LOW (ref 5–15)
BILIRUBIN TOTAL: 0.4 mg/dL (ref 0.3–1.2)
BUN: 19 mg/dL (ref 8–23)
CALCIUM: 8.7 mg/dL — AB (ref 8.9–10.3)
CO2: 42 mmol/L — ABNORMAL HIGH (ref 22–32)
CREATININE: 0.71 mg/dL (ref 0.61–1.24)
Chloride: 98 mmol/L (ref 98–111)
Glucose, Bld: 91 mg/dL (ref 70–99)
Potassium: 4.9 mmol/L (ref 3.5–5.1)
Sodium: 143 mmol/L (ref 135–145)
TOTAL PROTEIN: 6.8 g/dL (ref 6.5–8.1)

## 2018-06-13 LAB — CBC WITH DIFFERENTIAL/PLATELET
Basophils Absolute: 0 10*3/uL (ref 0.0–0.1)
Basophils Relative: 0 %
EOS PCT: 2 %
Eosinophils Absolute: 0.2 10*3/uL (ref 0.0–0.7)
HEMATOCRIT: 46.2 % (ref 39.0–52.0)
Hemoglobin: 13.9 g/dL (ref 13.0–17.0)
Lymphocytes Relative: 7 %
Lymphs Abs: 0.4 10*3/uL — ABNORMAL LOW (ref 0.7–4.0)
MCH: 29.6 pg (ref 26.0–34.0)
MCHC: 30.1 g/dL (ref 30.0–36.0)
MCV: 98.3 fL (ref 78.0–100.0)
MONO ABS: 0.7 10*3/uL (ref 0.1–1.0)
Monocytes Relative: 12 %
Neutro Abs: 4.8 10*3/uL (ref 1.7–7.7)
Neutrophils Relative %: 79 %
Platelets: 259 10*3/uL (ref 150–400)
RBC: 4.7 MIL/uL (ref 4.22–5.81)
RDW: 13.9 % (ref 11.5–15.5)
WBC: 6.1 10*3/uL (ref 4.0–10.5)

## 2018-06-13 LAB — BRAIN NATRIURETIC PEPTIDE: B NATRIURETIC PEPTIDE 5: 95 pg/mL (ref 0.0–100.0)

## 2018-06-13 LAB — TROPONIN I

## 2018-06-13 MED ORDER — ALBUTEROL SULFATE (2.5 MG/3ML) 0.083% IN NEBU
2.5000 mg | INHALATION_SOLUTION | Freq: Once | RESPIRATORY_TRACT | Status: DC
  Administered 2018-06-13: 2.5 mg via RESPIRATORY_TRACT

## 2018-06-13 MED ORDER — AZITHROMYCIN 500 MG IV SOLR
500.0000 mg | Freq: Once | INTRAVENOUS | Status: AC
  Administered 2018-06-13: 500 mg via INTRAVENOUS
  Filled 2018-06-13: qty 500

## 2018-06-13 MED ORDER — ONDANSETRON HCL 4 MG PO TABS: 4.0000 mg | ORAL_TABLET | Freq: Four times a day (QID) | ORAL | Status: DC | PRN

## 2018-06-13 MED ORDER — AZITHROMYCIN 250 MG PO TABS
250.0000 mg | ORAL_TABLET | Freq: Every day | ORAL | Status: DC
  Administered 2018-06-14: 250 mg via ORAL
  Filled 2018-06-13: qty 1

## 2018-06-13 MED ORDER — ACETAMINOPHEN 325 MG PO TABS: 650.0000 mg | ORAL_TABLET | Freq: Four times a day (QID) | ORAL | Status: DC | PRN

## 2018-06-13 MED ORDER — FUROSEMIDE 10 MG/ML IJ SOLN
20.0000 mg | Freq: Every day | INTRAMUSCULAR | Status: DC
  Administered 2018-06-13 – 2018-06-14 (×2): 20 mg via INTRAVENOUS
  Filled 2018-06-13 (×2): qty 2

## 2018-06-13 MED ORDER — IPRATROPIUM-ALBUTEROL 0.5-2.5 (3) MG/3ML IN SOLN
3.0000 mL | Freq: Four times a day (QID) | RESPIRATORY_TRACT | Status: DC
  Administered 2018-06-13 – 2018-06-14 (×2): 3 mL via RESPIRATORY_TRACT
  Filled 2018-06-13 (×2): qty 3

## 2018-06-13 MED ORDER — BUDESONIDE 0.25 MG/2ML IN SUSP
0.2500 mg | Freq: Two times a day (BID) | RESPIRATORY_TRACT | Status: DC
  Administered 2018-06-13 – 2018-06-14 (×2): 0.25 mg via RESPIRATORY_TRACT
  Filled 2018-06-13 (×2): qty 2

## 2018-06-13 MED ORDER — IPRATROPIUM-ALBUTEROL 0.5-2.5 (3) MG/3ML IN SOLN
3.0000 mL | RESPIRATORY_TRACT | Status: DC
  Administered 2018-06-13: 3 mL via RESPIRATORY_TRACT
  Filled 2018-06-13: qty 3

## 2018-06-13 MED ORDER — ALBUTEROL SULFATE (2.5 MG/3ML) 0.083% IN NEBU: 2.5000 mg | INHALATION_SOLUTION | RESPIRATORY_TRACT | Status: DC | PRN

## 2018-06-13 MED ORDER — ONDANSETRON HCL 4 MG/2ML IJ SOLN: 4.0000 mg | Freq: Four times a day (QID) | INTRAMUSCULAR | Status: DC | PRN

## 2018-06-13 MED ORDER — IPRATROPIUM-ALBUTEROL 0.5-2.5 (3) MG/3ML IN SOLN
3.0000 mL | RESPIRATORY_TRACT | Status: AC
  Administered 2018-06-13 (×3): 3 mL via RESPIRATORY_TRACT
  Filled 2018-06-13: qty 3
  Filled 2018-06-13: qty 6

## 2018-06-13 MED ORDER — IPRATROPIUM BROMIDE 0.02 % IN SOLN
0.5000 mg | Freq: Once | RESPIRATORY_TRACT | Status: DC
  Administered 2018-06-13: 0.5 mg via RESPIRATORY_TRACT

## 2018-06-13 MED ORDER — METHYLPREDNISOLONE SODIUM SUCC 125 MG IJ SOLR
60.0000 mg | Freq: Four times a day (QID) | INTRAMUSCULAR | Status: DC
  Administered 2018-06-13 – 2018-06-14 (×4): 60 mg via INTRAVENOUS
  Filled 2018-06-13 (×4): qty 2

## 2018-06-13 MED ORDER — METHYLPREDNISOLONE SODIUM SUCC 125 MG IJ SOLR
125.0000 mg | Freq: Once | INTRAMUSCULAR | Status: AC
  Administered 2018-06-13: 125 mg via INTRAVENOUS
  Filled 2018-06-13: qty 2

## 2018-06-13 MED ORDER — ENOXAPARIN SODIUM 40 MG/0.4ML ~~LOC~~ SOLN
40.0000 mg | SUBCUTANEOUS | Status: DC
  Administered 2018-06-13: 40 mg via SUBCUTANEOUS
  Filled 2018-06-13: qty 0.4

## 2018-06-13 MED ORDER — AZITHROMYCIN 250 MG PO TABS: 250.0000 mg | ORAL_TABLET | Freq: Every day | ORAL | Status: DC

## 2018-06-13 MED ORDER — AZITHROMYCIN 250 MG PO TABS: 500.0000 mg | ORAL_TABLET | Freq: Every day | ORAL | Status: DC

## 2018-06-13 MED ORDER — ACETAMINOPHEN 650 MG RE SUPP: 650.0000 mg | Freq: Four times a day (QID) | RECTAL | Status: DC | PRN

## 2018-06-13 MED ORDER — GUAIFENESIN ER 600 MG PO TB12
1200.0000 mg | ORAL_TABLET | Freq: Two times a day (BID) | ORAL | Status: DC
  Administered 2018-06-13 – 2018-06-14 (×2): 1200 mg via ORAL
  Filled 2018-06-13 (×2): qty 2

## 2018-06-13 NOTE — ED Notes (Signed)
Per MD pt able to eat and drink.

## 2018-06-13 NOTE — ED Triage Notes (Addendum)
Per EMS, pt was picked up from doctors office across the street. Per PCP, O2 sats were 77% on 3L, then given Duoneb and went up to 87% on 3L. Pt on chronic O2 at 3L. Bumped up pt's O2 to 6L, but EMS did not get an accurate reading of oxygen. Pt is alert and oriented x4. Ambulatory with walker. Pt has been coughing up thick sputum for a few days.

## 2018-06-13 NOTE — Assessment & Plan Note (Signed)
Neb treatment x 1 with improvement t o oxygen level of 87 %, still being transported to the ED by EMS due to fragile condition, discussed with ED doctor

## 2018-06-13 NOTE — ED Notes (Signed)
Pt ambulated. inital 02 93%. o2 dropped to 84%. Pt c/o some tightness in chest.

## 2018-06-13 NOTE — ED Notes (Signed)
Respiratory paged at this time for tx.  

## 2018-06-13 NOTE — Patient Instructions (Signed)
You need to be evaluated in te emergency room by the ED  Physician, who I have spoken  Directly to.  You have improved with thee breathing treatment , but I am very concerned about your overall condition  I will be in touch

## 2018-06-13 NOTE — ED Notes (Signed)
Report given at this time to 300 RN.

## 2018-06-13 NOTE — ED Provider Notes (Addendum)
Emergency Department Provider Note   I have reviewed the triage vital signs and the nursing notes.   HISTORY  Chief Complaint Shortness of Breath   HPI Kyle Mosley is a 83 y.o. male with multiple past medical problems documented below who presents to the emergency department today from his primary care office for shortness of breath.  Patient states that he did not take any of his medications today or breathing treatments because he was in a rush to get to scheduled follow-up with his primary doctor, Dr. Moshe Cipro.  States that normally he would rush and get short of breath like this and then he would rest and it would get better or he would do a breathing treatment and it would get better.  He has time to do either 1 of those this morning so he arrived for his appointment he was short of breath.  He was only on 2 L and they checked his oxygen saturation was 77%.  He is supposed to be on 3 L oxygen so they increased it and given a breathing treatment and his oxygen saturation was 87% and reportedly was still short of breath.  I discussed with Dr. Moshe Cipro before the patient arrived and she supplied some of this history.  Patient states he feels absolutely normal at this time.  His son who is in the room says he appears to be normal and agrees with him [place] more than normal this morning.  He does state some lower extremity swelling but does not state it is any worse than he had in the past but also is not as good as it has been in the past.  He has had a productive cough of brown sputum over the last few days that has been unchanged.  No fevers.  He does state that the weather seems to make his breathing worse.  No nausea, vomiting, diaphoresis or lightheadedness. No other associated or modifying symptoms.    Past Medical History:  Diagnosis Date  . Asthma   . Colon polyps   . COPD (chronic obstructive pulmonary disease) ([place])    3L home O2  . Diverticulosis   . Enlarged prostate    with elevated PSA  . Hyperlipidemia   . Hypertension   . Prediabetes 2011  . Pulmonary hypertension ([place]) 2012    Patient Active Problem List   Diagnosis Date Noted  . Vitamin D deficiency 02/12/2018  . Protein-calorie malnutrition, severe 11/29/2017  . Aortic aneurysm ([place])   . Acute on chronic respiratory failure with hypoxia ([place]) 02/18/2017  . COPD exacerbation ([place]) 02/17/2017  . Hypoxia 12/28/2016  . Loss of weight 05/27/2016  . Elevated PSA 03/26/2015  . Reduced vision 05/27/2014  . Repeated falls 09/08/2013  . Pulmonary HTN ([place]) 06/07/2013  . COPD (chronic obstructive pulmonary disease) ([place]) 05/08/2013  . Primary generalized (osteo)arthritis 01/01/2013  . IGT (impaired glucose tolerance) 02/15/2012  . Bronchial asthma 09/30/2009  . FATIGUE 06/23/2009  . POLYP, COLON 04/08/2008  . Hyperlipidemia LDL goal <100 04/08/2008    Past Surgical History:  Procedure Laterality Date  . COLONOSCOPY  03/21/2007   Dr. Jonny Ruiz diverticula, normal rectum  . COLONOSCOPY  05/04/2012   Procedure: COLONOSCOPY;  Surgeon: Daneil Dolin, MD;  Location[facility];  Service: Endoscopy;  Laterality: N/A;  7:30  . PROSTATE BIOPSY      Current Outpatient Rx  . Order #: [id number] Class: Normal  . Order #: [id number] Class: Normal  . Order #: [id number] Class: Print  .  Order #: [id number] Class: Normal  . Order #: [id number] Class: Normal  . Order #: [id number] Class: Normal  . Order #: [id number] Class: Normal  . Order #: [id number] Class: Normal  . Order #: [id number] Class: Normal  . Order #: [id number] Class: Print  . Order #: [id number] Class: Normal    Allergies Patient has no known allergies.  Family History  Problem Relation Age of Onset  . Heart attack Father   . Alcohol abuse Mother   . Hypertension Brother   . COPD Sister     Social History Social History   Tobacco Use  . Smoking status: Former Smoker    Packs/day: 1.00    Years: 25.00    Pack years: 25.00    Last  attempt to quit: 11/24/1996    Years since quitting: 21.5  . Smokeless tobacco: Never Used  Substance Use Topics  . Alcohol use: No  . Drug use: No    Review of Systems  All other systems negative except as documented in the HPI. All pertinent positives and negatives as reviewed in the HPI. ____________________________________________   PHYSICAL EXAM:  VITAL SIGNS: ED Triage Vitals  Enc Vitals Group     BP 06/13/18 0932 (!) 166/99     Pulse Rate 06/13/18 0932 82     Resp 06/13/18 0932 (!) 23     Temp 06/13/18 0932 97.8 F (36.6 C)     Temp Source 06/13/18 0932 Oral     SpO2 06/13/18 0932 99 %     Weight 06/13/18 0929 164 lb (74.4 kg)    Constitutional: Alert and oriented. Well appearing and in no acute distress. Eyes: Conjunctivae are normal. PERRL. EOMI. Head: Atraumatic. Nose: No congestion/rhinnorhea. Mouth/Throat: Mucous membranes are moist.  Oropharynx non-erythematous. Neck: No stridor.  No meningeal signs.   Cardiovascular: Normal rate, regular rhythm. Good peripheral circulation. Grossly normal heart sounds.   Respiratory: Normal respiratory effort.  No retractions. Lungs diminished with wheezing and crackles. Gastrointestinal: Soft and nontender. No distention.  Musculoskeletal: No lower extremity tenderness nor edema. No gross deformities of extremities. Neurologic:  Normal speech and language. No gross focal neurologic deficits are appreciated.  Skin:  Skin is warm, dry and intact. No rash noted.  ____________________________________________   LABS (all labs ordered are listed, but only abnormal results are displayed)  Labs Reviewed  CBC WITH DIFFERENTIAL/PLATELET - Abnormal; Notable for the following components:      Result Value   Lymphs Abs 0.4 (*)    All other components within normal limits  COMPREHENSIVE METABOLIC PANEL - Abnormal; Notable for the following components:   CO2 42 (*)    Calcium 8.7 (*)    Albumin 2.9 (*)    Anion gap 3 (*)    All  other components within normal limits  TROPONIN I  BRAIN NATRIURETIC PEPTIDE   ____________________________________________  EKG   EKG Interpretation  Date/Time:  Tuesday June 13 2018 09:34:10 EDT Ventricular Rate:  83 PR Interval:    QRS Duration: 145 QT Interval:  411 QTC Calculation: 483 R Axis:   -90 Text Interpretation:  Sinus rhythm RBBB and LAFB Baseline wander in lead(s) V5 V6 No significant change since last tracing Confirmed by Merrily Pew [phone]) on 06/13/2018 9:53:20 AM       ____________________________________________  RADIOLOGY  Dg Chest 2 View  Result Date: 06/13/2018 CLINICAL DATA:  Low O2 sats.  Shortness of breath EXAM: CHEST - 2 VIEW COMPARISON:  11/28/2017 FINDINGS: There is hyperinflation of the lungs compatible with COPD. Areas of  scarring in the lungs bilaterally. Chronic densities in the lung bases likely reflects scarring as well. No definite acute airspace opacity or effusion. Heart is normal size. IMPRESSION: Severe COPD/chronic changes.  No definite acute process. Electronically Signed   By: Rolm Baptise M.D.   On: 06/13/2018 11:47    ____________________________________________   PROCEDURES  Procedure(s) performed:   Procedures  CRITICAL CARE Performed by: Merrily Pew Total critical care time: 35 minutes Critical care time was exclusive of separately billable procedures and treating other patients. Critical care was necessary to treat or prevent imminent or life-threatening deterioration. Critical care was time spent personally by me on the following activities: development of treatment plan with patient and/or surrogate as well as nursing, discussions with consultants, evaluation of patient's response to treatment, examination of patient, obtaining history from patient or surrogate, ordering and performing treatments and interventions, ordering and review of laboratory studies, ordering and review of radiographic studies, pulse oximetry  and re-evaluation of patient's condition.  ____________________________________________   INITIAL IMPRESSION / ASSESSMENT AND PLAN / ED COURSE  Patient is adamant that he is at his baseline now.  He states that just because he was rushes while this was going on and I tend to believe him.  His son also corroborates this likely scenario.  His lungs however are still diminished with crackles and wheezing he has lower extremity edema as well.  Secondary to these findings will still work-up for any kind of fluid overload versus COPD exacerbation versus pneumonia with a productive cough and his history of COPD.  If patient seems to be improving and/or is still baseline will ambulate around the department and reassess for disposition.  Upon ambulation patient quickly desaturated to about 84% on his home 3 L.  Becomes tachypneic and feels like he has tight breathing.  On my assessment approximate 7 to 10 minutes after ambulating patient is still having difficulty breathing sit on the edge the bed.  His lungs do sound a little bit more open than previously but I think he probably needs more bronchodilators and further management observation hospital.  Discussed with hospitalist who will admit.     Pertinent labs & imaging results that were available during my care of the patient were reviewed by me and considered in my medical decision making (see chart for details).  ____________________________________________  FINAL CLINICAL IMPRESSION(S) / ED DIAGNOSES  Final diagnoses:  None     MEDICATIONS GIVEN DURING THIS VISIT:  Medications  ipratropium-albuterol (DUONEB) 0.5-2.5 (3) MG/3ML nebulizer solution 3 mL (3 mLs Nebulization Given 06/13/18 1432)  methylPREDNISolone sodium succinate (SOLU-MEDROL) 125 mg/2 mL injection 125 mg (125 mg Intravenous Given 06/13/18 1356)  azithromycin (ZITHROMAX) 500 mg in sodium chloride 0.9 % 250 mL IVPB (0 mg Intravenous Stopped 06/13/18 1457)     NEW OUTPATIENT  MEDICATIONS STARTED DURING THIS VISIT:  New Prescriptions   No medications on file    Note:  This note was prepared with assistance of Dragon voice recognition software. Occasional wrong-word or sound-a-like substitutions may have occurred due to the inherent limitations of voice recognition software.   Merrily Pew, MD 06/13/18 1511    Katie Moch, Corene Cornea, MD 06/27/18 [phone]

## 2018-06-13 NOTE — H&P (Signed)
History and Physical    Kyle Mosley WJX:[id number] DOB: 03-07-1935 DOA: 06/13/2018  PCP: Fayrene Helper, MD  Patient coming from: home  I have personally briefly reviewed patient's old medical records in [place]  Chief Complaint: shortness of breath  HPI: Kyle Mosley is a 82 y.o. male with medical history significant of oxygen dependent COPD, hyperlipidemia, reports having trouble breathing for the past 2 to 3 weeks.  He has had a cough, but has not been producing much sputum.  He has not had any fever, nausea, vomiting, diarrhea.  He has not had any sick contacts.  He does not smoke.  He is been using his nebulizer with some benefit, but symptoms have persisted.  He had gone to see his primary care physician today and was sent to the ER for further evaluation when he was noted to be hypoxic despite wearing oxygen.  At his primary care physician's office he was noted to be 79% on 3 L.  ED Course: Patient was noted to be short of breath and hypoxic on arrival to the emergency room.  Sats were noted to be in the 80s.  He received nebulizer treatments, steroids.  Chest x-ray did not show any evidence of pneumonia or CHF.  BNP was noted to be normal.  Cardiac enzymes negative.  Basic labs were unrevealing.  He is being referred for admission.  Review of Systems: As per HPI otherwise 10 point review of systems negative.    Past Medical History:  Diagnosis Date  . Asthma   . Colon polyps   . COPD (chronic obstructive pulmonary disease) ([place])    3L home O2  . Diverticulosis   . Enlarged prostate    with elevated PSA  . Hyperlipidemia   . Hypertension   . Prediabetes 2011  . Pulmonary hypertension ([place]) 2012    Past Surgical History:  Procedure Laterality Date  . COLONOSCOPY  03/21/2007   Dr. Jonny Ruiz diverticula, normal rectum  . COLONOSCOPY  05/04/2012   Procedure: COLONOSCOPY;  Surgeon: Daneil Dolin, MD;  Location[facility];  Service: Endoscopy;   Laterality: N/A;  7:30  . PROSTATE BIOPSY       reports that he quit smoking about 21 years ago. He has a 25.00 pack-year smoking history. He has never used smokeless tobacco. He reports that he does not drink alcohol or use drugs.  No Known Allergies  Family History  Problem Relation Age of Onset  . Heart attack Father   . Alcohol abuse Mother   . Hypertension Brother   . COPD Sister      Prior to Admission medications   Medication Sig Start Date End Date Taking? Authorizing Provider  albuterol (PROVENTIL) (2.5 MG/3ML) 0.083% nebulizer solution INHALE THE CONTENTS OF 1 VIAL VIA NEBULIZER TWO TO THREE TIMES DAILY 09/22/17  Yes Fayrene Helper, MD  albuterol (VENTOLIN HFA) 108 (90 Base) MCG/ACT inhaler Inhale 2 puffs into the lungs every 6 (six) hours as needed for wheezing or shortness of breath. 02/09/18  Yes Fayrene Helper, MD  feeding supplement, ENSURE ENLIVE, (ENSURE ENLIVE) LIQD Take 237 mLs by mouth 2 (two) times daily between meals. 11/30/17  Yes Tat, Shanon Brow, MD  Fluticasone-Salmeterol (ADVAIR) 250-50 MCG/DOSE AEPB Inhale 1 puff into the lungs 2 (two) times daily. 09/22/17  Yes Fayrene Helper, MD  ipratropium (ATROVENT) 0.02 % nebulizer solution USE ONE VIAL IN NEBULIZER TWO TO THREE TIMES A DAY. 09/22/17  Yes Fayrene Helper,  MD  lovastatin (MEVACOR) 10 MG tablet TAKE 1 TABLET (10 MG TOTAL) BY MOUTH AT BEDTIME (REPLACES 40 MG DOSE) 02/14/18  Yes Fayrene Helper, MD  mirtazapine (REMERON) 7.5 MG tablet TAKE 1 TABLET (7.5 MG TOTAL) BY MOUTH AT BEDTIME. 04/20/18  Yes Fayrene Helper, MD  predniSONE (STERAPRED UNI-PAK 21 TAB) 5 MG (21) TBPK tablet One tablet 3 times daily for 3 days, then one tablet twice daily for 3 days Patient taking differently: Take 1 mg by mouth daily. One tablet 3 times daily for 3 days, then one tablet twice daily for 3 days 02/09/18  Yes Fayrene Helper, MD  ranitidine (ZANTAC) 150 MG tablet TAKE 1 TABLET(150 MG) BY MOUTH DAILY 05/29/18   Yes Fayrene Helper, MD  UNABLE TO FIND Nebulizer machine and tubing  DX J44.1 11/28/17   Fayrene Helper, MD  Vitamin D, Ergocalciferol, (DRISDOL) 50000 units CAPS capsule TAKE 1 CAPSULE (50,000 UNITS TOTAL) BY MOUTH ONCE A WEEK.  05/08/18   Fayrene Helper, MD    Physical Exam: Vitals:   06/13/18 1430 06/13/18 1432 06/13/18 1527 06/13/18 1532  BP: 113/61   (!) 159/80  Pulse: 76   82  Resp: 14   20  Temp:    98 F (36.7 C)  TempSrc:    Oral  SpO2: 97% 98%  100%  Weight:   68.5 kg (151 lb 0.2 oz)   Height:   6' (1.829 m)     Constitutional: NAD, calm, comfortable Vitals:   06/13/18 1430 06/13/18 1432 06/13/18 1527 06/13/18 1532  BP: 113/61   (!) 159/80  Pulse: 76   82  Resp: 14   20  Temp:    98 F (36.7 C)  TempSrc:    Oral  SpO2: 97% 98%  100%  Weight:   68.5 kg (151 lb 0.2 oz)   Height:   6' (1.829 m)    Eyes: PERRL, lids and conjunctivae normal ENMT: Mucous membranes are moist. Posterior pharynx clear of any exudate or lesions.Normal dentition.  Neck: normal, supple, no masses, no thyromegaly Respiratory: diminished breath sounds. Increased respiratory effort. No accessory muscle use.  Cardiovascular: Regular rate and rhythm, no murmurs / rubs / gallops. 2+ extremity edema. 2+ pedal pulses. No carotid bruits.  Abdomen: no tenderness, no masses palpated. No hepatosplenomegaly. Bowel sounds positive.  Musculoskeletal: no clubbing / cyanosis. No joint deformity upper and lower extremities. Good ROM, no contractures. Normal muscle tone.  Skin: no rashes, lesions, ulcers. No induration Neurologic: CN 2-12 grossly intact. Sensation intact, DTR normal. Strength 5/5 in all 4.  Psychiatric: Normal judgment and insight. Alert and oriented x 3. Normal mood.    Labs on Admission: I have personally reviewed following labs and imaging studies  CBC: Recent Labs  Lab 06/13/18 1039  WBC 6.1  NEUTROABS 4.8  HGB 13.9  HCT 46.2  MCV 98.3  PLT 397   Basic Metabolic  Panel: Recent Labs  Lab 06/13/18 1039  NA 143  K 4.9  CL 98  CO2 42*  GLUCOSE 91  BUN 19  CREATININE 0.71  CALCIUM 8.7*   GFR: Estimated Creatinine Clearance: 70.2 mL/min (by C-G formula based on SCr of 0.71 mg/dL). Liver Function Tests: Recent Labs  Lab 06/13/18 1039  AST 16  ALT 14  ALKPHOS 69  BILITOT 0.4  PROT 6.8  ALBUMIN 2.9*   No results for input(s): LIPASE, AMYLASE in the last 168 hours. No results for input(s): AMMONIA in the last  168 hours. Coagulation Profile: No results for input(s): INR, PROTIME in the last 168 hours. Cardiac Enzymes: Recent Labs  Lab 06/13/18 1039  TROPONINI <0.03   BNP (last 3 results) No results for input(s): PROBNP in the last 8760 hours. HbA1C: No results for input(s): HGBA1C in the last 72 hours. CBG: No results for input(s): GLUCAP in the last 168 hours. Lipid Profile: No results for input(s): CHOL, HDL, LDLCALC, TRIG, CHOLHDL, LDLDIRECT in the last 72 hours. Thyroid Function Tests: No results for input(s): TSH, T4TOTAL, FREET4, T3FREE, THYROIDAB in the last 72 hours. Anemia Panel: No results for input(s): VITAMINB12, FOLATE, FERRITIN, TIBC, IRON, RETICCTPCT in the last 72 hours. Urine analysis: No results found for: COLORURINE, APPEARANCEUR, LABSPEC, PHURINE, GLUCOSEU, HGBUR, BILIRUBINUR, KETONESUR, PROTEINUR, UROBILINOGEN, NITRITE, LEUKOCYTESUR  Radiological Exams on Admission: Dg Chest 2 View  Result Date: 06/13/2018 CLINICAL DATA:  Low O2 sats.  Shortness of breath EXAM: CHEST - 2 VIEW COMPARISON:  11/28/2017 FINDINGS: There is hyperinflation of the lungs compatible with COPD. Areas of scarring in the lungs bilaterally. Chronic densities in the lung bases likely reflects scarring as well. No definite acute airspace opacity or effusion. Heart is normal size. IMPRESSION: Severe COPD/chronic changes.  No definite acute process. Electronically Signed   By: Rolm Baptise M.D.   On: 06/13/2018 11:47    EKG: Independently  reviewed. Sinus rhythm without acute changes  Assessment/Plan Active Problems:   Hyperlipidemia LDL goal <100   COPD exacerbation (HCC)   Acute on chronic respiratory failure with hypoxia (H[facility])   Aortic aneurysm ([place])     1. COPD exacerbation.  Started on intravenous steroids, bronchodilators and antibiotics.  We will continue pulmonary hygiene.  Start on inhaled steroids as well. 2. Acute on chronic respiratory failure with hypoxia.  Overall hypoxia has improved since admission.  Currently back on 3 L of oxygen. 3. Hyperlipidemia.  Continue statin 4. Abdominal aortic aneurysm.  Stable.  Continue outpatient follow-up.  5. Lower extremity edema.  Patient has bilateral lower extremity edema.  He reports that this is chronic, worse over the past few days.  Check bilateral lower extremity Dopplers.  He had echocardiogram done earlier this year where he was noted to have a normal ejection fraction.  He noted to have normal right ventricular systolic function..  Start on low-dose Lasix.  DVT prophylaxis: Lovenox Code Status: Full code Family Communication: No family present Disposition Plan: Discharge home once improved Consults called:   Admission status: Observation, MedSurg  Kathie Dike MD [facility] Pager [phone]  If 7PM-7AM, please contact night-coverage [email or web address] Password [facility]  06/13/2018, 6:04 PM

## 2018-06-13 NOTE — Progress Notes (Signed)
   LUCIUS Mosley     MRN: [id number]      DOB: 1935/01/16   HPI [name] is here in severe respiratory distress, states did not use his neb machine today, 1 week h/o cough productive of thick sputum  Weak and unable to [facility] independently,  Unable to speak in a sentence without severe respiratory distress ROS See HPI  . Marland Kitchen Denies chest pains, palpitations and leg swelling Denies abdominal pain, nausea, vomiting,diarrhea or constipation.   Denies dysuria, frequency, hesitancy or incontinence. . Denies headaches, seizures, numbness, or tingling. Denies depression, anxiety or insomnia. Denies skin break down or rash.   PE  BP (!) 142/86   Pulse 78   Resp (!) 36   Ht 6' (1.829 m)   SpO2 (!) 79% Comment: on 3 liters  BMI 21.43 kg/m   Patient alert , unable to speak in sentences in severe cardiopulmonary distress.Using acces ory muscles of respiration to breathe  HEENT: No facial asymmetry, EOMI,   oropharynx pink and moist.  Neck supple no JVD, no mass.  Chest:Markedly  decreased air entry, bibasilar wheezes  CVS: S1, S2 no murmurs, no S3.Regular rate.  ABD: Soft non tender.   Ext: No edema    Assessment & Plan  COPD exacerbation ([place]) Neb treatment x 1 with improvement t o oxygen level of 87 %, still being transported to the ED by EMS due to fragile condition, discussed with ED doctor  Hypoxia Hypoxia due to acute respiratory failure Patient started on neb treatment nn the office and EMS contacted to transport him emergently to the ED I spoke directly with the receiving Physician

## 2018-06-14 ENCOUNTER — Observation Stay (HOSPITAL_COMMUNITY): Payer: Medicare HMO

## 2018-06-14 DIAGNOSIS — J441 Chronic obstructive pulmonary disease with (acute) exacerbation: Secondary | ICD-10-CM | POA: Diagnosis not present

## 2018-06-14 DIAGNOSIS — J9621 Acute and chronic respiratory failure with hypoxia: Secondary | ICD-10-CM | POA: Diagnosis not present

## 2018-06-14 DIAGNOSIS — I714 Abdominal aortic aneurysm, without rupture: Secondary | ICD-10-CM | POA: Diagnosis not present

## 2018-06-14 DIAGNOSIS — R600 Localized edema: Secondary | ICD-10-CM | POA: Diagnosis not present

## 2018-06-14 DIAGNOSIS — E785 Hyperlipidemia, unspecified: Secondary | ICD-10-CM | POA: Diagnosis not present

## 2018-06-14 LAB — CBC
HEMATOCRIT: 46.1 % (ref 39.0–52.0)
HEMOGLOBIN: 14 g/dL (ref 13.0–17.0)
MCH: 30 pg (ref 26.0–34.0)
MCHC: 30.4 g/dL (ref 30.0–36.0)
MCV: 98.9 fL (ref 78.0–100.0)
Platelets: 268 10*3/uL (ref 150–400)
RBC: 4.66 MIL/uL (ref 4.22–5.81)
RDW: 13.8 % (ref 11.5–15.5)
WBC: 3.6 10*3/uL — ABNORMAL LOW (ref 4.0–10.5)

## 2018-06-14 LAB — BASIC METABOLIC PANEL
ANION GAP: 6 (ref 5–15)
BUN: 19 mg/dL (ref 8–23)
CHLORIDE: 96 mmol/L — AB (ref 98–111)
CO2: 39 mmol/L — ABNORMAL HIGH (ref 22–32)
Calcium: 8.7 mg/dL — ABNORMAL LOW (ref 8.9–10.3)
Creatinine, Ser: 0.77 mg/dL (ref 0.61–1.24)
GFR calc Af Amer: >60 mL/min (ref >60)
GLUCOSE: 152 mg/dL — AB (ref 70–99)
POTASSIUM: 4.4 mmol/L (ref 3.5–5.1)
Sodium: 141 mmol/L (ref 135–145)

## 2018-06-14 MED ORDER — AZITHROMYCIN 250 MG PO TABS: 250.0000 mg | ORAL_TABLET | Freq: Every day | ORAL | 0 refills | Status: DC

## 2018-06-14 MED ORDER — GUAIFENESIN ER 600 MG PO TB12: 600.0000 mg | ORAL_TABLET | Freq: Two times a day (BID) | ORAL | 0 refills | Status: DC

## 2018-06-14 MED ORDER — PREDNISONE 10 MG PO TABS: ORAL_TABLET | ORAL | 0 refills | Status: DC

## 2018-06-14 MED ORDER — FUROSEMIDE 20 MG PO TABS: 20.0000 mg | ORAL_TABLET | Freq: Every day | ORAL | 0 refills | Status: DC | PRN

## 2018-06-14 MED ORDER — IPRATROPIUM-ALBUTEROL 0.5-2.5 (3) MG/3ML IN SOLN
3.0000 mL | Freq: Three times a day (TID) | RESPIRATORY_TRACT | Status: DC
  Administered 2018-06-14: 3 mL via RESPIRATORY_TRACT
  Filled 2018-06-14: qty 3

## 2018-06-14 NOTE — Care Management Obs Status (Signed)
[place] NOTIFICATION   Patient Details  Name: DUKE WEISENSEL MRN: [id number] Date of Birth: 06-02-1935   Medicare Observation Status Notification Given:  Yes    Sherald Barge, RN 06/14/2018, 11:20 AM

## 2018-06-14 NOTE — Discharge Summary (Signed)
Physician Discharge Summary  Kyle Mosley BHA:[id number] DOB: October 25, 1935 DOA: 06/13/2018  PCP: Fayrene Helper, MD  Admit date: 06/13/2018 Discharge date: 06/14/2018  Admitted From: Home Disposition: Home  Recommendations for Outpatient Follow-up:  1. Follow up with PCP in 1-2 weeks 2. Please obtain BMP/CBC in one week  Home Health: Home health RN Equipment/Devices:  Discharge Condition: Stable CODE STATUS: Full code Diet recommendation: Heart healthy  Brief/Interim Summary: 83 year old male with a history of oxygen dependent/steroid-dependent COPD, hyperlipidemia, was admitted to the hospital with 2 to 3-week history of progressive shortness of breath.  He was found to have COPD exacerbation.  He was hypoxic on his baseline oxygen requirement of 3 L.  Patient was admitted to the hospital and started on intravenous steroids, he also received nebulizer treatments and antibiotics.  The following day, the patient was felt significantly improved.  He felt that his breathing was better than his baseline.  He was not having any hypoxia on his baseline oxygen requirement.  The patient will be transitioned to prednisone taper.  He will continue on nebulizer treatments at home and complete his course of antibiotics.  He was noted to have some lower extremity edema.  Venous Dopplers were negative for DVT.  He recently had an echocardiogram that showed a preserved ejection fraction.  He was given a dose of Lasix with significant improvement of his edema.  We will recommend he use Lasix as needed for swelling.  Also recommended to keep his lower extremities elevated and consider compression hose.  Discharge Diagnoses:  Active Problems:   Hyperlipidemia LDL goal <100   COPD exacerbation (HCC)   Acute on chronic respiratory failure with hypoxia [facility])   Aortic aneurysm [facility])    Discharge Instructions  Discharge Instructions    Diet - low sodium heart healthy   Complete by:  As directed     Increase activity slowly   Complete by:  As directed      Allergies as of 06/14/2018   No Known Allergies     Medication List    STOP taking these medications   predniSONE 5 MG (21) Tbpk tablet Commonly known as:  STERAPRED UNI-PAK 21 TAB Replaced by:  predniSONE 10 MG tablet     TAKE these medications   albuterol (2.5 MG/3ML) 0.083% nebulizer solution Commonly known as:  PROVENTIL INHALE THE CONTENTS OF 1 VIAL VIA NEBULIZER TWO TO THREE TIMES DAILY   albuterol 108 (90 Base) MCG/ACT inhaler Commonly known as:  VENTOLIN HFA Inhale 2 puffs into the lungs every 6 (six) hours as needed for wheezing or shortness of breath.   azithromycin 250 MG tablet Commonly known as:  ZITHROMAX Take 1 tablet (250 mg total) by mouth daily.   feeding supplement (ENSURE ENLIVE) Liqd Take 237 mLs by mouth 2 (two) times daily between meals.   Fluticasone-Salmeterol 250-50 MCG/DOSE Aepb Commonly known as:  ADVAIR Inhale 1 puff into the lungs 2 (two) times daily.   furosemide 20 MG tablet Commonly known as:  LASIX Take 1 tablet (20 mg total) by mouth daily as needed for fluid.   guaiFENesin 600 MG 12 hr tablet Commonly known as:  MUCINEX Take 1 tablet (600 mg total) by mouth 2 (two) times daily.   ipratropium 0.02 % nebulizer solution Commonly known as:  ATROVENT USE ONE VIAL IN NEBULIZER TWO TO THREE TIMES A DAY.   lovastatin 10 MG tablet Commonly known as:  MEVACOR TAKE 1 TABLET (10 MG TOTAL) BY MOUTH AT BEDTIME (REPLACES  40 MG DOSE)   mirtazapine 7.5 MG tablet Commonly known as:  REMERON TAKE 1 TABLET (7.5 MG TOTAL) BY MOUTH AT BEDTIME.   predniSONE 10 MG tablet Commonly known as:  DELTASONE Take 40mg  po daily for 2 days then 30mg  daily for 2 days then 20mg  daily for 2 days then 10mg  daily for 2 days then 5mg  daily Replaces:  predniSONE 5 MG (21) Tbpk tablet   ranitidine 150 MG tablet Commonly known as:  ZANTAC TAKE 1 TABLET(150 MG) BY MOUTH DAILY   UNABLE TO FIND Nebulizer  machine and tubing  DX J44.1   Vitamin D (Ergocalciferol) 50000 units Caps capsule Commonly known as:  DRISDOL TAKE 1 CAPSULE (50,000 UNITS TOTAL) BY MOUTH ONCE A WEEK.       No Known Allergies  Consultations:     Procedures/Studies: Dg Chest 2 View  Result Date: 06/13/2018 CLINICAL DATA:  Low O2 sats.  Shortness of breath EXAM: CHEST - 2 VIEW COMPARISON:  11/28/2017 FINDINGS: There is hyperinflation of the lungs compatible with COPD. Areas of scarring in the lungs bilaterally. Chronic densities in the lung bases likely reflects scarring as well. No definite acute airspace opacity or effusion. Heart is normal size. IMPRESSION: Severe COPD/chronic changes.  No definite acute process. Electronically Signed   By: Rolm Baptise M.D.   On: 06/13/2018 11:47   US Venous Img Lower Bilateral  Result Date: 06/14/2018 CLINICAL DATA:  83 year old with leg edema. EXAM: BILATERAL LOWER EXTREMITY VENOUS DOPPLER ULTRASOUND TECHNIQUE: Gray-scale sonography with graded compression, as well as color Doppler and duplex ultrasound were performed to evaluate the lower extremity deep venous systems from the level of the common femoral vein and including the common femoral, femoral, profunda femoral, popliteal and calf veins including the posterior tibial, peroneal and gastrocnemius veins when visible. The superficial great saphenous vein was also interrogated. Spectral Doppler was utilized to evaluate flow at rest and with distal augmentation maneuvers in the common femoral, femoral and popliteal veins. COMPARISON:  None. FINDINGS: RIGHT LOWER EXTREMITY Common Femoral Vein: No evidence of thrombus. Normal compressibility, respiratory phasicity and response to augmentation. Saphenofemoral Junction: No evidence of thrombus. Normal compressibility and flow on color Doppler imaging. Profunda Femoral Vein: No evidence of thrombus. Normal compressibility and flow on color Doppler imaging. Femoral Vein: No evidence of  thrombus. Normal compressibility, respiratory phasicity and response to augmentation. Popliteal Vein: No evidence of thrombus. Normal compressibility, respiratory phasicity and response to augmentation. Calf Veins: No evidence of thrombus. Normal compressibility and flow on color Doppler imaging. Superficial Great Saphenous Vein: No evidence of thrombus. Normal compressibility. Venous Reflux:  None. Other Findings:  None. LEFT LOWER EXTREMITY Common Femoral Vein: No evidence of thrombus. Normal compressibility, respiratory phasicity and response to augmentation. Saphenofemoral Junction: No evidence of thrombus. Normal compressibility and flow on color Doppler imaging. Profunda Femoral Vein: No evidence of thrombus. Normal compressibility and flow on color Doppler imaging. Femoral Vein: No evidence of thrombus. Normal compressibility, respiratory phasicity and response to augmentation. Popliteal Vein: No evidence of thrombus. Normal compressibility, respiratory phasicity and response to augmentation. Calf Veins: No evidence of thrombus. Normal compressibility and flow on color Doppler imaging. Superficial Great Saphenous Vein: No evidence of thrombus. Normal compressibility. Venous Reflux:  None. Other Findings:  None. IMPRESSION: No evidence of deep venous thrombosis. Electronically Signed   By: Markus Daft M.D.   On: 06/14/2018 14:17       Subjective: Feels that breathing is better.  No cough.  Feels that breathing is  better than his baseline  Discharge Exam: Vitals:   06/14/18 0610 06/14/18 0730  BP: (!) 142/78   Pulse: 73   Resp: 15   Temp: (!) 97.4 F (36.3 C)   SpO2: 100% 97%   Vitals:   06/13/18 2111 06/13/18 2238 06/14/18 0610 06/14/18 0730  BP:  133/79 (!) 142/78   Pulse:  73 73   Resp:  16 15   Temp:  98.5 F (36.9 C) (!) 97.4 F (36.3 C)   TempSrc:  Oral Oral   SpO2: 95% 97% 100% 97%  Weight:      Height:        General: Pt is alert, awake, not in acute  distress Cardiovascular: RRR, S1/S2 +, no rubs, no gallops Respiratory: Diminished air movement, no wheezing Abdominal: Soft, NT, ND, bowel sounds + Extremities: 1+ edema, no cyanosis    The results of significant diagnostics from this hospitalization (including imaging, microbiology, ancillary and laboratory) are listed below for reference.     Microbiology: No results found for this or any previous visit (from the past 240 hour(s)).   Labs: BNP (last 3 results) Recent Labs    11/28/17 1539 06/13/18 1039  BNP 66.0 34.2   Basic Metabolic Panel: Recent Labs  Lab 06/13/18 1039 06/14/18 0515  NA 143 141  K 4.9 4.4  CL 98 96*  CO2 42* 39*  GLUCOSE 91 152*  BUN 19 19  CREATININE 0.71 0.77  CALCIUM 8.7* 8.7*   Liver Function Tests: Recent Labs  Lab 06/13/18 1039  AST 16  ALT 14  ALKPHOS 69  BILITOT 0.4  PROT 6.8  ALBUMIN 2.9*   No results for input(s): LIPASE, AMYLASE in the last 168 hours. No results for input(s): AMMONIA in the last 168 hours. CBC: Recent Labs  Lab 06/13/18 1039 06/14/18 0515  WBC 6.1 3.6*  NEUTROABS 4.8  --   HGB 13.9 14.0  HCT 46.2 46.1  MCV 98.3 98.9  PLT 259 268   Cardiac Enzymes: Recent Labs  Lab 06/13/18 1039  TROPONINI <0.03   BNP: Invalid input(s): POCBNP CBG: No results for input(s): GLUCAP in the last 168 hours. D-Dimer No results for input(s): DDIMER in the last 72 hours. Hgb A1c No results for input(s): HGBA1C in the last 72 hours. Lipid Profile No results for input(s): CHOL, HDL, LDLCALC, TRIG, CHOLHDL, LDLDIRECT in the last 72 hours. Thyroid function studies No results for input(s): TSH, T4TOTAL, T3FREE, THYROIDAB in the last 72 hours.  Invalid input(s): FREET3 Anemia work up No results for input(s): VITAMINB12, FOLATE, FERRITIN, TIBC, IRON, RETICCTPCT in the last 72 hours. Urinalysis No results found for: COLORURINE, APPEARANCEUR, LABSPEC, [place], GLUCOSEU, HGBUR, BILIRUBINUR, KETONESUR, PROTEINUR,  UROBILINOGEN, NITRITE, LEUKOCYTESUR Sepsis Labs Invalid input(s): PROCALCITONIN,  WBC,  LACTICIDVEN Microbiology No results found for this or any previous visit (from the past 240 hour(s)).   Time coordinating discharge: 74mins  SIGNED:   Kathie Dike, MD  [facility] 06/14/2018, 2:31 PM Pager   If 7PM-7AM, please contact night-coverage [email or web address] Password [email or web address]

## 2018-06-14 NOTE — Care Management Note (Signed)
Case Management Note  Patient Details  Name: Kyle Mosley MRN: [id number] Date of Birth: Jan 17, 1935  Subjective/Objective:       Observation for COPD exacerbation. Pt from home, lives with wife, ind with ADL's at baseline. Pt uses cane with ambulation. He has home oxygen and neb machine.              Action/Plan: DC home today with self care. MD has ordered [place] but pt has had bad experiences with [facility] nursing in the past and would like to "try on his own" first. Pt made aware this is okay but he will have to contact his PCP if he changes his mind after a day or so. Pt okay with his. CM mad referral for COPD emmi calls and explained program to pt.   Expected Discharge Date:  06/14/18               Expected Discharge Plan:  Home/Self Care  In-House Referral:  [facility]  Discharge planning Services  CM Consult  Post Acute Care Choice:  NA Choice offered to:  NA  Status of Service:  Completed, signed off  Sherald Barge, RN 06/14/2018, 2:54 PM

## 2018-06-14 NOTE — Progress Notes (Signed)
Patient is to be discharged home and in stable condition. Patient's IV removed, WNL. Patient given discharge instructions and verbalized understanding. Patient to be discharged via wheelchair when ready.  Celestia Khat, RN

## 2018-06-15 ENCOUNTER — Telehealth: Payer: Self-pay

## 2018-06-15 NOTE — Telephone Encounter (Signed)
Transition Care Management Follow-up Telephone Call   Date discharged?  06/14/18              How have you been since you were released from the hospital? little bit better   Do you understand why you were in the hospital? Chest tightness, couldn't breath   Do you understand the discharge instructions? yes   Where were you discharged to? home    Items Reviewed:  Medications reviewed: yes  Allergies reviewed: yes  Dietary changes reviewed: yes  Referrals reviewed: no new referrals   Functional Questionnaire:   Activities of Daily Living (ADLs):  has help if he needs    Any transportation issues/concerns?: no   Any patient concerns? no   Confirmed importance and date/time of follow-up visits scheduled transferred to front staff to schedule TOC follow up appt     Confirmed with patient if condition begins to worsen call PCP or go to the ER.  Patient was given the office number and encouraged to call back with question or concerns.  yes with verbal understanding.

## 2018-06-22 ENCOUNTER — Ambulatory Visit (INDEPENDENT_AMBULATORY_CARE_PROVIDER_SITE_OTHER): Payer: Medicare HMO | Admitting: Family Medicine

## 2018-06-22 ENCOUNTER — Encounter: Payer: Self-pay | Admitting: Family Medicine

## 2018-06-22 ENCOUNTER — Other Ambulatory Visit: Payer: Self-pay

## 2018-06-22 VITALS — BP 118/70 | HR 79 | Resp 12 | Ht 72.0 in | Wt 167.1 lb

## 2018-06-22 DIAGNOSIS — I10 Essential (primary) hypertension: Secondary | ICD-10-CM | POA: Diagnosis not present

## 2018-06-22 DIAGNOSIS — Z09 Encounter for follow-up examination after completed treatment for conditions other than malignant neoplasm: Secondary | ICD-10-CM

## 2018-06-22 LAB — BASIC METABOLIC PANEL WITH GFR
BUN / CREAT RATIO: 47 (calc) — AB (ref 6–22)
BUN: 34 mg/dL — ABNORMAL HIGH (ref 7–25)
CO2: 39 mmol/L — ABNORMAL HIGH (ref 20–32)
Calcium: 8.7 mg/dL (ref 8.6–10.3)
Chloride: 99 mmol/L (ref 98–110)
Creat: 0.72 mg/dL (ref 0.70–1.11)
GFR, EST AFRICAN AMERICAN: 101 mL/min/{1.73_m2} (ref >=60)
GFR, EST NON AFRICAN AMERICAN: 87 mL/min/{1.73_m2} (ref >=60)
Glucose, Bld: 80 mg/dL (ref 65–139)
Potassium: 4.6 mmol/L (ref 3.5–5.3)
SODIUM: 143 mmol/L (ref 135–146)

## 2018-06-22 LAB — CBC
HEMATOCRIT: 44 % (ref 38.5–50.0)
Hemoglobin: 14.4 g/dL (ref 13.2–17.1)
MCH: 29.5 pg (ref 27.0–33.0)
MCHC: 32.7 g/dL (ref 32.0–36.0)
MCV: 90.2 fL (ref 80.0–100.0)
MPV: 11.2 fL (ref 7.5–12.5)
Platelets: 241 10*3/uL (ref 140–400)
RBC: 4.88 10*6/uL (ref 4.20–5.80)
RDW: 12.7 % (ref 11.0–15.0)
WBC: 8.6 10*3/uL (ref 3.8–10.8)

## 2018-06-22 NOTE — Patient Instructions (Addendum)
Annual physical exam with MD October 9 or shortly after, call if you need me before   Lab today cBC and Chem 7 today  Please stay on track as promised

## 2018-06-26 ENCOUNTER — Other Ambulatory Visit: Payer: Self-pay

## 2018-06-26 NOTE — Patient Outreach (Signed)
[facility]) Care Management  06/26/2018  IVAL PACER 1935-04-02 [id number]  EMMI: COPD red alert Referral date: 06/26/18 Referral reason: feeling worse overall Insurance: [facility] Day # 11  Telephone call to patient regarding EMMI copd RED alert. Unable to reach patient or leave voice message.  Phone only rang.    PLAN: RNCM will attempt 2nd telephone call to patient within 4 business days.  RNCM will send outreach letter to attempt contact.   Quinn Plowman RN,BSN,CCM [facility] Telephonic  [phone]

## 2018-06-27 ENCOUNTER — Other Ambulatory Visit: Payer: Self-pay

## 2018-06-27 NOTE — Patient Outreach (Signed)
[facility]) Care Management  06/27/2018  TRAJAN GROVE [birth year] [id number]  EMMI: COPD red alert Referral date: 06/26/18 Referral reason: feeling worse overall Insurance: [facility] Day # 11  Telephone call to patient regarding EMMI COPD red alert. HIPAA verified with patient.  Explained reason for call. Patient states he is not feeling worse.  He states he is doing fine.  Patient reports he has had his follow up visit with his primary MD since being discharged from the hospital . Patient states he has transportation to appointments. Patient states he is taking his medications as prescribed and using his oxygen and nebulizer.  Patient denies having home health services.  RNCM discussed and offered [facility] care management services. Patient declined.   RNCM discussed signs/ symptoms of COPD. Advised to call his doctor for symptoms.  Advised to call 911 for severe symptoms. Patient verbalized understanding.  RNCM advised patient to notify MD of any changes in condition prior to scheduled appointment. RNCM will  provide contact name and number: [phone] or main office number [phone] and 24 hour nurse advise line [phone] by mail as requested. Marland Kitchen  RNCM verified patient aware of 911 services for urgent/ emergent needs. Patient denied any further need/ concerns at this time.   PLAN: RNCM will close patient due to patient refusing [facility] care management services.  RNCM will mail patient [facility] care management brochure/ magnet RNCM will send closure notification to patients primary MD.   Quinn Plowman RN,BSN,CCM [facility] Telephonic  [phone]

## 2018-07-02 ENCOUNTER — Encounter: Payer: Self-pay | Admitting: Family Medicine

## 2018-07-02 NOTE — Assessment & Plan Note (Addendum)
Hospital course previewed with patient, medications reconciled and he is to have appropriate lab tests on the day of the visit. Them importance of tacking his neb treatments on schedule is stressed, and also the importance of not holding out feeling ill for weeks before coming in for help is also stressed The need to commit to protein supplements is stressed for his nutrition and well being also

## 2018-07-02 NOTE — Progress Notes (Signed)
   Kyle Mosley     MRN: [id number]      DOB: 06/17/1935   HPI [name] is here for follow up of hospitalization from 7/23 to 7/24/2019for COPD exacerbation and mild malnutrition His hospital course and discharge  Medications are reviewed  He feels significantly better, and reports using his neb machine at least 2 times daily as well as drinking the supplements for nutrition He has completed the Z pack and his breathings is improved ROS  Denies sinus pressure, nasal congestion, ear pain or sore throat. C/o intermittent cough and  wheezing. Denies chest pains, palpitations and leg swelling Denies abdominal pain, nausea, vomiting,diarrhea or constipation.   Denies dysuria, frequency, hesitancy or incontinence. C/o  limitation in mobility. Denies headaches, seizures, numbness, or tingling. Denies depression, anxiety or insomnia. Denies skin break down or rash.   PE BP 118/70 (BP Location: Right Arm, Patient Position: Sitting, Cuff Size: Normal)   Pulse 79   Resp 12   Ht 6' (1.829 m)   Wt 167 lb 1.9 oz (75.8 kg)   SpO2 (!) 88% Comment: on 3 liters oxygen  BMI 22.67 kg/m     Patient alert and oriented and in no cardiopulmonary distress at rest  HEENT: No facial asymmetry, EOMI,   oropharynx pink and moist.  Neck decreased ROM no JVD, no mass.  Chest: Clear to auscultation bilaterally.Decreased air entry throughout CVS: S1, S2 no murmurs, no S3.Regular rate.  ABD: Soft non tender.   Ext: No edema  EF:[id number] ROM spine, hips, shoulders and knees  Skin: Intact, no ulcerations or rash noted.  Psych: Good eye contact, normal affect. not anxious or depressed appearing.  CNS: CN 2-12 intact, power,  normal throughout.no focal deficits noted.   [facility] discharge follow-up Hospital course previewed with patient, medications reconciled and he is to have appropriate lab tests on the day of the visit. Them importance of tacking his neb treatments on  schedule is stressed, and also the importance of not holding out feeling ill for weeks before coming in for help is also stressed The need to commit to protein supplements is stressed for his nutrition and well being also

## 2018-07-02 NOTE — Assessment & Plan Note (Signed)
Hypoxia due to acute respiratory failure Patient started on neb treatment nn the office and EMS contacted to transport him emergently to the ED I spoke directly with the receiving Physician

## 2018-07-03 ENCOUNTER — Other Ambulatory Visit: Payer: Self-pay | Admitting: Family Medicine

## 2018-07-03 DIAGNOSIS — E785 Hyperlipidemia, unspecified: Secondary | ICD-10-CM | POA: Diagnosis not present

## 2018-07-03 DIAGNOSIS — J449 Chronic obstructive pulmonary disease, unspecified: Secondary | ICD-10-CM | POA: Diagnosis not present

## 2018-08-03 DIAGNOSIS — J449 Chronic obstructive pulmonary disease, unspecified: Secondary | ICD-10-CM | POA: Diagnosis not present

## 2018-08-03 DIAGNOSIS — E785 Hyperlipidemia, unspecified: Secondary | ICD-10-CM | POA: Diagnosis not present

## 2018-09-02 DIAGNOSIS — J449 Chronic obstructive pulmonary disease, unspecified: Secondary | ICD-10-CM | POA: Diagnosis not present

## 2018-09-02 DIAGNOSIS — E785 Hyperlipidemia, unspecified: Secondary | ICD-10-CM | POA: Diagnosis not present

## 2018-09-05 ENCOUNTER — Ambulatory Visit (INDEPENDENT_AMBULATORY_CARE_PROVIDER_SITE_OTHER): Payer: Medicare HMO | Admitting: Family Medicine

## 2018-09-05 ENCOUNTER — Encounter: Payer: Self-pay | Admitting: Family Medicine

## 2018-09-05 VITALS — BP 150/70 | HR 75 | Resp 12 | Ht 72.0 in | Wt 158.0 lb

## 2018-09-05 DIAGNOSIS — K5900 Constipation, unspecified: Secondary | ICD-10-CM

## 2018-09-05 DIAGNOSIS — Z0000 Encounter for general adult medical examination without abnormal findings: Secondary | ICD-10-CM | POA: Diagnosis not present

## 2018-09-05 DIAGNOSIS — Z23 Encounter for immunization: Secondary | ICD-10-CM

## 2018-09-05 DIAGNOSIS — R600 Localized edema: Secondary | ICD-10-CM

## 2018-09-05 MED ORDER — POTASSIUM CHLORIDE ER 10 MEQ PO TBCR: 10.0000 meq | EXTENDED_RELEASE_TABLET | Freq: Every day | ORAL | 3 refills | Status: DC

## 2018-09-05 MED ORDER — FUROSEMIDE 20 MG PO TABS: 20.0000 mg | ORAL_TABLET | Freq: Every day | ORAL | 1 refills | Status: DC

## 2018-09-05 MED ORDER — TAMSULOSIN HCL 0.4 MG PO CAPS: 0.4000 mg | ORAL_CAPSULE | Freq: Every day | ORAL | 3 refills | Status: DC

## 2018-09-05 NOTE — Patient Instructions (Signed)
F/U in  8 to 9 weeks, call if you need me sooner  Flu vaccine today  Add Raisin bran or shredded wheat with raisins to your oats, and also drink prune juice 4 oz daily  Stop aspirin  When you finish weekly vit D stop   New is daily lasix and potassium for leg swelling  New is Flomax one at supper for urine stream  Careful not to fall

## 2018-09-10 ENCOUNTER — Encounter: Payer: Self-pay | Admitting: Family Medicine

## 2018-09-10 DIAGNOSIS — K5900 Constipation, unspecified: Secondary | ICD-10-CM | POA: Insufficient documentation

## 2018-09-10 DIAGNOSIS — R600 Localized edema: Secondary | ICD-10-CM | POA: Insufficient documentation

## 2018-09-10 NOTE — Progress Notes (Signed)
   Kyle Mosley     MRN: [id number]      DOB: 1935-03-18   HPI: Patient is in for annual physical exam. C/obilateral leg sweling often and requests daily diureitic instead of intermittent , states he needs this C/o constipation and hard and infrequent stool, mobility is extremely limited as is his intake Recent labs, if available are reviewed. Immunization is reviewed , and  updated if needed.    PE; BP (!) 150/70 (BP Location: Right Arm, Patient Position: Sitting, Cuff Size: Normal)   Pulse 75   Resp 12   Ht 6' (1.829 m)   Wt 158 lb (71.7 kg)   SpO2 (!) 89% Comment: with 3lpm supplemental oxygen  BMI 21.43 kg/m  Pleasant male, alert and oriented x 3, in no cardio-pulmonary distress AT REST, VERY LITTLE EXERECISE TOLERANCE. Chronicall ill appearing with bitemporal wasting and undernourished Afebrile. HEENT No facial trauma or asymetry. Sinuses non tender. EOMI, pupils equally reactive to light. External ears normal, tympanic membranes clear. Oropharynx moist, no exudate. Neck: decreased ROM no adenopathy,JVD or thyromegaly.No bruits.  Chest: Clear to ascultation bilaterally.No crackles or wheezes.Markedly reduced air entry bilaterally but at bASELINE Non tender to palpation  Breast: No asymetry,no masses. No nipple discharge or inversion. No axillary or supraclavicular adenopathy  Cardiovascular system; Heart sounds normal,  S1 and  S2 ,no S3.  No murmur, or thrill. Apical beat not displaced Peripheral pulses normal. Bilateral 1 plus pitting edema  Abdomen: Soft, non tender, no organomegaly or masses. No bruits. Bowel sounds normal. No guarding, tenderness or rebound.    Musculoskeletal exam: Decreased  ROM of spine, hips , shoulders and knees.  deformity ,swelling and  crepitus noted.in knees  Muscle wasting and  atrophy. Present   Neurologic: Cranial nerves 2 to 12 intact. Decreased Power and  Tone throughout ,sensation  normal throughout. Abnormal   gait. No tremor.  Skin: Intact, no ulceration, bruising noted Pigmentation normal throughout  Psych; Normal mood and affect. Judgement and concentration normal   Assessment & Plan:  Annual physical exam Annual exam as documented.  Immunization  needs are specifically addressed at this visit.   Bilateral leg edema Pt has cor pulmonale from severe respiratory failure. Start daly lasix and potassium  Constipation in male Notes reduced frequency of BM with hard stool,educated re increasing fiber and water intake and use of oTC laxative every 3 days if needed as well as daily stool softeners. He has a combination of poor intake and reduced mobility

## 2018-09-10 NOTE — Assessment & Plan Note (Signed)
Notes reduced frequency of BM with hard stool,educated re increasing fiber and water intake and use of oTC laxative every 3 days if needed as well as daily stool softeners. He has a combination of poor intake and reduced mobility

## 2018-09-10 NOTE — Assessment & Plan Note (Signed)
Pt has cor pulmonale from severe respiratory failure. Start daly lasix and potassium

## 2018-09-10 NOTE — Assessment & Plan Note (Signed)
Annual exam as documented.  Immunization needs are specifically addressed at this visit.  

## 2018-09-15 ENCOUNTER — Other Ambulatory Visit: Payer: Self-pay | Admitting: Family Medicine

## 2018-09-22 ENCOUNTER — Other Ambulatory Visit: Payer: Self-pay | Admitting: Family Medicine

## 2018-09-22 DIAGNOSIS — F32A Depression, unspecified: Secondary | ICD-10-CM

## 2018-09-22 DIAGNOSIS — F329 Major depressive disorder, single episode, unspecified: Secondary | ICD-10-CM

## 2018-10-02 ENCOUNTER — Other Ambulatory Visit: Payer: Self-pay | Admitting: Family Medicine

## 2018-10-02 NOTE — Telephone Encounter (Signed)
Should I refill this?

## 2018-10-03 DIAGNOSIS — J449 Chronic obstructive pulmonary disease, unspecified: Secondary | ICD-10-CM | POA: Diagnosis not present

## 2018-10-03 DIAGNOSIS — E785 Hyperlipidemia, unspecified: Secondary | ICD-10-CM | POA: Diagnosis not present

## 2018-10-05 ENCOUNTER — Other Ambulatory Visit: Payer: Self-pay

## 2018-10-05 ENCOUNTER — Telehealth: Payer: Self-pay | Admitting: Family Medicine

## 2018-10-05 MED ORDER — TAMSULOSIN HCL 0.4 MG PO CAPS: 0.4000 mg | ORAL_CAPSULE | Freq: Every day | ORAL | 1 refills | Status: DC

## 2018-10-05 NOTE — Telephone Encounter (Signed)
Medication refilled and sent to [facility]

## 2018-10-05 NOTE — Telephone Encounter (Signed)
Please sent Medication to [facility]--tamsulosin (FLOMAX) 0.4 MG CAPS capsule

## 2018-10-16 ENCOUNTER — Other Ambulatory Visit: Payer: Self-pay | Admitting: Family Medicine

## 2018-10-30 ENCOUNTER — Encounter: Payer: Self-pay | Admitting: *Deleted

## 2018-11-02 DIAGNOSIS — J449 Chronic obstructive pulmonary disease, unspecified: Secondary | ICD-10-CM | POA: Diagnosis not present

## 2018-11-02 DIAGNOSIS — E785 Hyperlipidemia, unspecified: Secondary | ICD-10-CM | POA: Diagnosis not present

## 2018-11-07 ENCOUNTER — Other Ambulatory Visit: Payer: Self-pay

## 2018-11-07 ENCOUNTER — Telehealth: Payer: Self-pay | Admitting: *Deleted

## 2018-11-07 DIAGNOSIS — I272 Pulmonary hypertension, unspecified: Secondary | ICD-10-CM

## 2018-11-07 MED ORDER — FUROSEMIDE 20 MG PO TABS: 20.0000 mg | ORAL_TABLET | Freq: Every day | ORAL | 1 refills | Status: DC

## 2018-11-07 NOTE — Telephone Encounter (Signed)
[facility] called. Pt needs a refill on furosemide 20 mg. Pt was using [facility] but wants it transferred to [facility].

## 2018-11-07 NOTE — Progress Notes (Signed)
Lasix reorder and sent to [facility]

## 2018-11-20 ENCOUNTER — Other Ambulatory Visit: Payer: Self-pay | Admitting: Family Medicine

## 2018-11-20 ENCOUNTER — Ambulatory Visit: Payer: Medicare HMO | Admitting: Family Medicine

## 2018-12-03 DIAGNOSIS — J449 Chronic obstructive pulmonary disease, unspecified: Secondary | ICD-10-CM | POA: Diagnosis not present

## 2018-12-03 DIAGNOSIS — E785 Hyperlipidemia, unspecified: Secondary | ICD-10-CM | POA: Diagnosis not present

## 2018-12-04 ENCOUNTER — Other Ambulatory Visit: Payer: Self-pay

## 2018-12-04 ENCOUNTER — Telehealth: Payer: Self-pay | Admitting: Family Medicine

## 2018-12-04 MED ORDER — POTASSIUM CHLORIDE ER 10 MEQ PO TBCR: 10.0000 meq | EXTENDED_RELEASE_TABLET | Freq: Every day | ORAL | 3 refills | Status: DC

## 2018-12-04 NOTE — Telephone Encounter (Signed)
Please send Potassium to [facility]

## 2018-12-04 NOTE — Progress Notes (Signed)
Potassium reordered to [facility]

## 2018-12-06 ENCOUNTER — Other Ambulatory Visit: Payer: Self-pay | Admitting: Family Medicine

## 2018-12-11 ENCOUNTER — Ambulatory Visit (INDEPENDENT_AMBULATORY_CARE_PROVIDER_SITE_OTHER): Payer: Medicare HMO | Admitting: Family Medicine

## 2018-12-11 ENCOUNTER — Encounter: Payer: Self-pay | Admitting: Family Medicine

## 2018-12-11 VITALS — BP 118/60 | HR 87 | Resp 12 | Ht 72.0 in | Wt 166.1 lb

## 2018-12-11 DIAGNOSIS — E43 Unspecified severe protein-calorie malnutrition: Secondary | ICD-10-CM | POA: Diagnosis not present

## 2018-12-11 DIAGNOSIS — I714 Abdominal aortic aneurysm, without rupture, unspecified: Secondary | ICD-10-CM

## 2018-12-11 DIAGNOSIS — R600 Localized edema: Secondary | ICD-10-CM | POA: Diagnosis not present

## 2018-12-11 DIAGNOSIS — J438 Other emphysema: Secondary | ICD-10-CM

## 2018-12-11 DIAGNOSIS — E785 Hyperlipidemia, unspecified: Secondary | ICD-10-CM | POA: Diagnosis not present

## 2018-12-11 DIAGNOSIS — I272 Pulmonary hypertension, unspecified: Secondary | ICD-10-CM

## 2018-12-11 MED ORDER — FLUTICASONE-SALMETEROL 250-50 MCG/DOSE IN AEPB: 1.0000 | INHALATION_SPRAY | Freq: Two times a day (BID) | RESPIRATORY_TRACT | 3 refills | Status: DC

## 2018-12-11 MED ORDER — IPRATROPIUM BROMIDE 0.02 % IN SOLN: RESPIRATORY_TRACT | 1 refills | Status: DC

## 2018-12-11 MED ORDER — ALBUTEROL SULFATE (2.5 MG/3ML) 0.083% IN NEBU: INHALATION_SOLUTION | RESPIRATORY_TRACT | 1 refills | Status: DC

## 2018-12-11 NOTE — Patient Instructions (Signed)
F/U early May, call if you need me sooner  New additional inhaler for daily use, Advair,is prescribed  CBC, Fasting lipid, cmp and EGFr 1 week before follow up  Thankful doing better  Elevate legs as much as possible  Be careful not to fall

## 2018-12-17 ENCOUNTER — Encounter: Payer: Self-pay | Admitting: Family Medicine

## 2018-12-17 NOTE — Assessment & Plan Note (Signed)
Chronic dependence on Oxygen, has been fairly stable over past 4 months, contine oxygen supplement

## 2018-12-17 NOTE — Assessment & Plan Note (Signed)
Controlled fairly well, he is encouraged to elevate his legs as often as possible

## 2018-12-17 NOTE — Assessment & Plan Note (Signed)
Hyperlipidemia:Low fat diet discussed and encouraged.   Lipid Panel  Lab Results  Component Value Date   CHOL 127 09/22/2017   HDL 86 09/22/2017   LDLCALC 30 09/22/2017   TRIG 38 09/22/2017   CHOLHDL 1.5 09/22/2017     Updated lab needed at/ before next visit.  

## 2018-12-17 NOTE — Assessment & Plan Note (Signed)
severe COPD, needs to resume daily prophylactic medication and this is prescribed

## 2018-12-17 NOTE — Progress Notes (Signed)
   Kyle Mosley     MRN: [id number]      DOB: 05-22-1935   HPI Mr. Fails is here for follow up and re-evaluation of chronic medical conditions, medication management and review of any available recent lab and radiology data.  Preventive health is updated, specifically  Cancer screening and Immunization.   d. The PT denies any adverse reactions to current medications since the last visit.  There are no new concerns.  There are no specific complaints   ROS Denies recent fever or chills. Denies sinus pressure, nasal congestion, ear pain or sore throat. Denies chest congestion, productive cough has chronic shortness of breath and  wheezing. Denies chest pains, palpitations and leg swelling Denies abdominal pain, nausea, vomiting,diarrhea or constipation.   Denies dysuria, frequency, hesitancy or incontinence. Denies uncontrolled  joint pain, swelling and limitation in mobility. Denies headaches, seizures, numbness, or tingling. Denies depression, anxiety or insomnia. Denies skin break down or rash.   PE  BP 118/60   Pulse 87   Resp 12   Ht 6' (1.829 m)   Wt 166 lb 1.9 oz (75.4 kg)   SpO2 93% Comment: with 3LPM oxygen  BMI 22.53 kg/m   Patient alert and oriented and in no cardiopulmonary distress.  HEENT: No facial asymmetry, EOMI,   oropharynx pink and moist.  Neck supple no JVD, no mass.  Chest: decreased air entry thtroughout with scattered high pitched wheezing  CVS: S1, S2 no murmurs, no S3.Regular rate.  ABD: Soft non tender.   Ext: trace  edema  VU:DTHYHOOIL ROM spine, shoulders, hips and knees.  Skin: Intact, no ulcerations or rash noted.  Psych: Good eye contact, normal affect. Memory impaired  not anxious or depressed appearing.  CNS: CN 2-12 intact, power,  normal throughout.no focal deficits noted.   Assessment & Plan  Pulmonary HTN ([place]) Chronic dependence on Oxygen, has been fairly stable over past 4 months, contine oxygen  supplement  Protein-calorie malnutrition, severe Appears to be improving , has gained weight , and will ned updated albumin, he is to continue protein supplements   Hyperlipidemia LDL goal <100 Hyperlipidemia:Low fat diet discussed and encouraged.   Lipid Panel  Lab Results  Component Value Date   CHOL 127 09/22/2017   HDL 86 09/22/2017   LDLCALC 30 09/22/2017   TRIG 38 09/22/2017   CHOLHDL 1.5 09/22/2017   Updated lab needed at/ before next visit.     Bilateral leg edema Controlled fairly well, he is encouraged to elevate his legs as often as possible  Aortic aneurysm (HCC) Asymptomataic, measured 3.8 cm in 11/2017, repeat study in 2 years recommended  COPD (chronic obstructive pulmonary disease) (HCC) severe COPD, needs to resume daily prophylactic medication and this is prescribed

## 2018-12-17 NOTE — Assessment & Plan Note (Signed)
Asymptomataic, measured 3.8 cm in 11/2017, repeat study in 2 years recommended

## 2018-12-17 NOTE — Assessment & Plan Note (Signed)
Appears to be improving , has gained weight , and will ned updated albumin, he is to continue protein supplements

## 2018-12-25 ENCOUNTER — Other Ambulatory Visit: Payer: Self-pay | Admitting: Family Medicine

## 2019-01-03 DIAGNOSIS — E785 Hyperlipidemia, unspecified: Secondary | ICD-10-CM | POA: Diagnosis not present

## 2019-01-03 DIAGNOSIS — J449 Chronic obstructive pulmonary disease, unspecified: Secondary | ICD-10-CM | POA: Diagnosis not present

## 2019-02-01 DIAGNOSIS — E785 Hyperlipidemia, unspecified: Secondary | ICD-10-CM | POA: Diagnosis not present

## 2019-02-01 DIAGNOSIS — J449 Chronic obstructive pulmonary disease, unspecified: Secondary | ICD-10-CM | POA: Diagnosis not present

## 2019-02-12 ENCOUNTER — Other Ambulatory Visit: Payer: Self-pay | Admitting: Family Medicine

## 2019-02-12 DIAGNOSIS — F32A Depression, unspecified: Secondary | ICD-10-CM

## 2019-02-12 DIAGNOSIS — F329 Major depressive disorder, single episode, unspecified: Secondary | ICD-10-CM

## 2019-02-16 ENCOUNTER — Other Ambulatory Visit: Payer: Self-pay | Admitting: Family Medicine

## 2019-02-18 ENCOUNTER — Encounter (HOSPITAL_COMMUNITY): Payer: Self-pay | Admitting: Emergency Medicine

## 2019-02-18 ENCOUNTER — Emergency Department (HOSPITAL_COMMUNITY): Payer: Medicare HMO

## 2019-02-18 ENCOUNTER — Other Ambulatory Visit: Payer: Self-pay

## 2019-02-18 ENCOUNTER — Observation Stay (HOSPITAL_COMMUNITY)
Admission: EM | Admit: 2019-02-18 | Discharge: 2019-02-19 | Disposition: A | Discharge status: Home / Self Care | Payer: Medicare HMO | Attending: Internal Medicine | Admitting: Internal Medicine

## 2019-02-18 DIAGNOSIS — Z87891 Personal history of nicotine dependence: Secondary | ICD-10-CM | POA: Insufficient documentation

## 2019-02-18 DIAGNOSIS — I5030 Unspecified diastolic (congestive) heart failure: Secondary | ICD-10-CM | POA: Diagnosis not present

## 2019-02-18 DIAGNOSIS — Z9981 Dependence on supplemental oxygen: Secondary | ICD-10-CM | POA: Insufficient documentation

## 2019-02-18 DIAGNOSIS — J9622 Acute and chronic respiratory failure with hypercapnia: Secondary | ICD-10-CM

## 2019-02-18 DIAGNOSIS — Z7901 Long term (current) use of anticoagulants: Secondary | ICD-10-CM | POA: Diagnosis not present

## 2019-02-18 DIAGNOSIS — R000 Tachycardia, unspecified: Secondary | ICD-10-CM

## 2019-02-18 DIAGNOSIS — N400 Enlarged prostate without lower urinary tract symptoms: Secondary | ICD-10-CM | POA: Insufficient documentation

## 2019-02-18 DIAGNOSIS — I4892 Unspecified atrial flutter: Secondary | ICD-10-CM

## 2019-02-18 DIAGNOSIS — E785 Hyperlipidemia, unspecified: Secondary | ICD-10-CM | POA: Insufficient documentation

## 2019-02-18 DIAGNOSIS — I110 Hypertensive heart disease with heart failure: Secondary | ICD-10-CM | POA: Diagnosis not present

## 2019-02-18 DIAGNOSIS — Z20828 Contact with and (suspected) exposure to other viral communicable diseases: Secondary | ICD-10-CM | POA: Diagnosis not present

## 2019-02-18 DIAGNOSIS — R0902 Hypoxemia: Secondary | ICD-10-CM

## 2019-02-18 DIAGNOSIS — I4891 Unspecified atrial fibrillation: Secondary | ICD-10-CM | POA: Insufficient documentation

## 2019-02-18 DIAGNOSIS — Z79899 Other long term (current) drug therapy: Secondary | ICD-10-CM | POA: Diagnosis not present

## 2019-02-18 DIAGNOSIS — J441 Chronic obstructive pulmonary disease with (acute) exacerbation: Secondary | ICD-10-CM | POA: Diagnosis not present

## 2019-02-18 DIAGNOSIS — I483 Typical atrial flutter: Secondary | ICD-10-CM | POA: Diagnosis not present

## 2019-02-18 DIAGNOSIS — J449 Chronic obstructive pulmonary disease, unspecified: Secondary | ICD-10-CM

## 2019-02-18 DIAGNOSIS — I272 Pulmonary hypertension, unspecified: Secondary | ICD-10-CM | POA: Insufficient documentation

## 2019-02-18 DIAGNOSIS — I4510 Unspecified right bundle-branch block: Secondary | ICD-10-CM | POA: Diagnosis not present

## 2019-02-18 DIAGNOSIS — R0602 Shortness of breath: Secondary | ICD-10-CM | POA: Diagnosis not present

## 2019-02-18 LAB — COMPREHENSIVE METABOLIC PANEL
ALT: 13 U/L (ref 0–44)
AST: 17 U/L (ref 15–41)
Albumin: 2.8 g/dL — ABNORMAL LOW (ref 3.5–5.0)
Alkaline Phosphatase: 72 U/L (ref 38–126)
Anion gap: 8 (ref 5–15)
BUN: 24 mg/dL — ABNORMAL HIGH (ref 8–23)
CHLORIDE: 98 mmol/L (ref 98–111)
CO2: 35 mmol/L — ABNORMAL HIGH (ref 22–32)
CREATININE: 0.77 mg/dL (ref 0.61–1.24)
Calcium: 8.4 mg/dL — ABNORMAL LOW (ref 8.9–10.3)
GFR calc Af Amer: >60 mL/min (ref >60)
Glucose, Bld: 86 mg/dL (ref 70–99)
Potassium: 4.4 mmol/L (ref 3.5–5.1)
Sodium: 141 mmol/L (ref 135–145)
Total Bilirubin: 0.6 mg/dL (ref 0.3–1.2)
Total Protein: 6.3 g/dL — ABNORMAL LOW (ref 6.5–8.1)

## 2019-02-18 LAB — CBC WITH DIFFERENTIAL/PLATELET
Abs Immature Granulocytes: 0.01 10*3/uL (ref 0.00–0.07)
Basophils Absolute: 0 10*3/uL (ref 0.0–0.1)
Basophils Relative: 0 %
Eosinophils Absolute: 0.1 10*3/uL (ref 0.0–0.5)
Eosinophils Relative: 1 %
HCT: 43.8 % (ref 39.0–52.0)
Hemoglobin: 13.1 g/dL (ref 13.0–17.0)
Immature Granulocytes: 0 %
Lymphocytes Relative: 9 %
Lymphs Abs: 0.4 10*3/uL — ABNORMAL LOW (ref 0.7–4.0)
MCH: 30 pg (ref 26.0–34.0)
MCHC: 29.9 g/dL — AB (ref 30.0–36.0)
MCV: 100.5 fL — ABNORMAL HIGH (ref 80.0–100.0)
Monocytes Absolute: 0.5 10*3/uL (ref 0.1–1.0)
Monocytes Relative: 11 %
Neutro Abs: 3.8 10*3/uL (ref 1.7–7.7)
Neutrophils Relative %: 79 %
Platelets: 188 10*3/uL (ref 150–400)
RBC: 4.36 MIL/uL (ref 4.22–5.81)
RDW: 13.8 % (ref 11.5–15.5)
WBC: 4.8 10*3/uL (ref 4.0–10.5)
nRBC: 0 % (ref 0.0–0.2)

## 2019-02-18 LAB — BLOOD GAS, VENOUS
ACID-BASE EXCESS: 13.9 mmol/L — AB (ref 0.0–2.0)
Bicarbonate: 34.3 mmol/L — ABNORMAL HIGH (ref 20.0–28.0)
FIO2: 32
O2 Saturation: 64.8 %
Patient temperature: 37
pCO2, Ven: 81.8 mmHg (ref 44.0–60.0)
pH, Ven: 7.316 (ref 7.250–7.430)
pO2, Ven: 38 mmHg (ref 32.0–45.0)

## 2019-02-18 LAB — INFLUENZA PANEL BY PCR (TYPE A & B)
Influenza A By PCR: NEGATIVE
Influenza B By PCR: NEGATIVE

## 2019-02-18 LAB — BRAIN NATRIURETIC PEPTIDE: B Natriuretic Peptide: 182 pg/mL — ABNORMAL HIGH (ref 0.0–100.0)

## 2019-02-18 LAB — LACTIC ACID, PLASMA
Lactic Acid, Venous: 0.9 mmol/L (ref 0.5–1.9)
Lactic Acid, Venous: 1.3 mmol/L (ref 0.5–1.9)

## 2019-02-18 LAB — TROPONIN I: Troponin I: <0.03 ng/mL (ref <0.03)

## 2019-02-18 MED ORDER — FUROSEMIDE 10 MG/ML IJ SOLN
40.0000 mg | Freq: Two times a day (BID) | INTRAMUSCULAR | Status: DC
  Administered 2019-02-19: 40 mg via INTRAVENOUS
  Filled 2019-02-18: qty 4

## 2019-02-18 MED ORDER — ENSURE ENLIVE PO LIQD
237.0000 mL | Freq: Two times a day (BID) | ORAL | Status: DC
  Administered 2019-02-19: 237 mL via ORAL

## 2019-02-18 MED ORDER — IPRATROPIUM BROMIDE HFA 17 MCG/ACT IN AERS: 2.0000 | INHALATION_SPRAY | Freq: Three times a day (TID) | RESPIRATORY_TRACT | Status: DC

## 2019-02-18 MED ORDER — ORAL CARE MOUTH RINSE
15.0000 mL | Freq: Two times a day (BID) | OROMUCOSAL | Status: DC
  Administered 2019-02-19: 15 mL via OROMUCOSAL

## 2019-02-18 MED ORDER — ACETAMINOPHEN 325 MG PO TABS: 650.0000 mg | ORAL_TABLET | Freq: Four times a day (QID) | ORAL | Status: DC | PRN

## 2019-02-18 MED ORDER — METHYLPREDNISOLONE SODIUM SUCC 125 MG IJ SOLR
60.0000 mg | Freq: Two times a day (BID) | INTRAMUSCULAR | Status: DC
  Administered 2019-02-18 – 2019-02-19 (×3): 60 mg via INTRAVENOUS
  Filled 2019-02-18 (×3): qty 2

## 2019-02-18 MED ORDER — IPRATROPIUM-ALBUTEROL 20-100 MCG/ACT IN AERS
1.0000 | INHALATION_SPRAY | Freq: Four times a day (QID) | RESPIRATORY_TRACT | Status: DC
  Administered 2019-02-19 (×2): 1 via RESPIRATORY_TRACT
  Filled 2019-02-18: qty 4

## 2019-02-18 MED ORDER — POLYETHYLENE GLYCOL 3350 17 G PO PACK: 17.0000 g | PACK | Freq: Every day | ORAL | Status: DC | PRN

## 2019-02-18 MED ORDER — ENOXAPARIN SODIUM 40 MG/0.4ML ~~LOC~~ SOLN
40.0000 mg | Freq: Every day | SUBCUTANEOUS | Status: DC
  Administered 2019-02-18: 40 mg via SUBCUTANEOUS
  Filled 2019-02-18: qty 0.4

## 2019-02-18 MED ORDER — ONDANSETRON HCL 4 MG PO TABS: 4.0000 mg | ORAL_TABLET | Freq: Four times a day (QID) | ORAL | Status: DC | PRN

## 2019-02-18 MED ORDER — SODIUM CHLORIDE 0.9 % IV SOLN
500.0000 mg | INTRAVENOUS | Status: DC
  Administered 2019-02-18 – 2019-02-19 (×2): 500 mg via INTRAVENOUS
  Filled 2019-02-18 (×2): qty 500

## 2019-02-18 MED ORDER — ALBUTEROL SULFATE HFA 108 (90 BASE) MCG/ACT IN AERS
6.0000 | INHALATION_SPRAY | Freq: Once | RESPIRATORY_TRACT | Status: AC
  Administered 2019-02-18: 6 via RESPIRATORY_TRACT
  Filled 2019-02-18: qty 6.7

## 2019-02-18 MED ORDER — PRAVASTATIN SODIUM 20 MG PO TABS
10.0000 mg | ORAL_TABLET | Freq: Every day | ORAL | Status: DC
  Administered 2019-02-19: 10 mg via ORAL
  Filled 2019-02-18: qty 1

## 2019-02-18 MED ORDER — ALBUTEROL SULFATE HFA 108 (90 BASE) MCG/ACT IN AERS
2.0000 | INHALATION_SPRAY | Freq: Three times a day (TID) | RESPIRATORY_TRACT | Status: DC
  Filled 2019-02-18: qty 6.7

## 2019-02-18 MED ORDER — SODIUM CHLORIDE 0.9 % IV SOLN
1.0000 g | INTRAVENOUS | Status: DC
  Administered 2019-02-18 – 2019-02-19 (×2): 1 g via INTRAVENOUS
  Filled 2019-02-18: qty 1
  Filled 2019-02-18: qty 10

## 2019-02-18 MED ORDER — METOPROLOL TARTRATE 5 MG/5ML IV SOLN: 5.0000 mg | INTRAVENOUS | Status: DC | PRN

## 2019-02-18 MED ORDER — DM-GUAIFENESIN ER 30-600 MG PO TB12
2.0000 | ORAL_TABLET | Freq: Every day | ORAL | Status: DC
  Administered 2019-02-19: 2 via ORAL
  Filled 2019-02-18: qty 2

## 2019-02-18 MED ORDER — ALBUTEROL SULFATE HFA 108 (90 BASE) MCG/ACT IN AERS
2.0000 | INHALATION_SPRAY | RESPIRATORY_TRACT | Status: DC | PRN
  Filled 2019-02-18: qty 6.7

## 2019-02-18 MED ORDER — MOMETASONE FURO-FORMOTEROL FUM 200-5 MCG/ACT IN AERO
2.0000 | INHALATION_SPRAY | Freq: Two times a day (BID) | RESPIRATORY_TRACT | Status: DC
  Administered 2019-02-18 – 2019-02-19 (×2): 2 via RESPIRATORY_TRACT
  Filled 2019-02-18: qty 8.8

## 2019-02-18 MED ORDER — POTASSIUM CHLORIDE CRYS ER 10 MEQ PO TBCR
10.0000 meq | EXTENDED_RELEASE_TABLET | Freq: Every day | ORAL | Status: DC
  Administered 2019-02-19: 10 meq via ORAL
  Filled 2019-02-18: qty 1

## 2019-02-18 MED ORDER — ACETAMINOPHEN 650 MG RE SUPP: 650.0000 mg | Freq: Four times a day (QID) | RECTAL | Status: DC | PRN

## 2019-02-18 MED ORDER — FUROSEMIDE 10 MG/ML IJ SOLN
40.0000 mg | Freq: Once | INTRAMUSCULAR | Status: AC
  Administered 2019-02-18: 40 mg via INTRAVENOUS
  Filled 2019-02-18: qty 4

## 2019-02-18 MED ORDER — ALBUTEROL SULFATE HFA 108 (90 BASE) MCG/ACT IN AERS
2.0000 | INHALATION_SPRAY | Freq: Four times a day (QID) | RESPIRATORY_TRACT | Status: DC
  Administered 2019-02-18: 2 via RESPIRATORY_TRACT
  Filled 2019-02-18: qty 6.7

## 2019-02-18 MED ORDER — TAMSULOSIN HCL 0.4 MG PO CAPS
0.4000 mg | ORAL_CAPSULE | Freq: Every day | ORAL | Status: DC
  Administered 2019-02-19: 0.4 mg via ORAL
  Filled 2019-02-18: qty 1

## 2019-02-18 MED ORDER — IPRATROPIUM BROMIDE HFA 17 MCG/ACT IN AERS
2.0000 | INHALATION_SPRAY | Freq: Four times a day (QID) | RESPIRATORY_TRACT | Status: DC
  Administered 2019-02-18: 2 via RESPIRATORY_TRACT
  Filled 2019-02-18: qty 12.9

## 2019-02-18 MED ORDER — MIRTAZAPINE 15 MG PO TABS
7.5000 mg | ORAL_TABLET | Freq: Every day | ORAL | Status: DC
  Administered 2019-02-18: 7.5 mg via ORAL
  Filled 2019-02-18: qty 1

## 2019-02-18 MED ORDER — FAMOTIDINE 20 MG PO TABS
20.0000 mg | ORAL_TABLET | Freq: Every day | ORAL | Status: DC
  Administered 2019-02-19: 20 mg via ORAL
  Filled 2019-02-18: qty 1

## 2019-02-18 MED ORDER — ONDANSETRON HCL 4 MG/2ML IJ SOLN: 4.0000 mg | Freq: Four times a day (QID) | INTRAMUSCULAR | Status: DC | PRN

## 2019-02-18 NOTE — ED Notes (Signed)
Date and time results received: 02/18/19 1245  Test: pCO2 Critical Value: 81.8  Name of Provider Notified: Dr. Eulis Foster  Orders Received? Or Actions Taken?: See chart

## 2019-02-18 NOTE — ED Notes (Signed)
RT notified for inhaler

## 2019-02-18 NOTE — ED Notes (Signed)
ED Provider at bedside. 

## 2019-02-18 NOTE — ED Notes (Signed)
Phlebotomy at bedside.

## 2019-02-18 NOTE — ED Notes (Signed)
Report given to [facility],  

## 2019-02-18 NOTE — ED Triage Notes (Signed)
Per EMS, pt from home c/o SOB that began yesterday. Hx of COPD, wears 3L at home. Pt also has drainage to LT eye. Pt afib and bundle branch block on monitor per EMS. Reports mild cough, but no fever.

## 2019-02-18 NOTE — ED Notes (Signed)
Pt given dinner meal tray

## 2019-02-18 NOTE — ED Notes (Signed)
[facility] called to transfer pt to [facility].

## 2019-02-18 NOTE — ED Notes (Signed)
[facility] here to transport pt,  

## 2019-02-18 NOTE — ED Notes (Signed)
Pt's spouse updated on plan of care,

## 2019-02-18 NOTE — ED Provider Notes (Addendum)
[facility] EMERGENCY DEPARTMENT Provider Note   CSN: [id number] Arrival date & time: 02/18/19  1152    History   Chief Complaint Chief Complaint  Patient presents with  . Shortness of Breath    HPI Kyle Mosley is a 84 y.o. male.     HPI   He presents by EMS for evaluation of shortness of breath, which began yesterday.  He uses inhalers and oxygen at home.  He has had some eye drainage recently.  He denies fever, vomiting or dizziness.  No known sick contacts.  There are no other known modifying factors.  He presents by EMS for evaluation.  He was treated symptomatically during transport without medications.  There are no other known modifying factors.  Past Medical History:  Diagnosis Date  . Asthma   . Colon polyps   . COPD (chronic obstructive pulmonary disease) ([place])    3L home O2  . Diverticulosis   . Enlarged prostate    with elevated PSA  . Hyperlipidemia   . Hypertension   . Prediabetes 2011  . Pulmonary hypertension ([place]) 2012    Patient Active Problem List   Diagnosis Date Noted  . Bilateral leg edema 09/10/2018  . Protein-calorie malnutrition, severe 11/29/2017  . Aortic aneurysm ([place])   . Hypoxia 12/28/2016  . Loss of weight 05/27/2016  . Elevated PSA 03/26/2015  . Reduced vision 05/27/2014  . Repeated falls 09/08/2013  . Pulmonary HTN ([place]) 06/07/2013  . COPD (chronic obstructive pulmonary disease) ([place]) 05/08/2013  . Primary generalized (osteo)arthritis 01/01/2013  . Bronchial asthma 09/30/2009  . FATIGUE 06/23/2009  . POLYP, COLON 04/08/2008  . Hyperlipidemia LDL goal <100 04/08/2008    Past Surgical History:  Procedure Laterality Date  . COLONOSCOPY  03/21/2007   Dr. Jonny Ruiz diverticula, normal rectum  . COLONOSCOPY  05/04/2012   Procedure: COLONOSCOPY;  Surgeon: Daneil Dolin, MD;  Location: [facility] SUITE;  Service: Endoscopy;  Laterality: N/A;  7:30  . PROSTATE BIOPSY          Home Medications    Prior to Admission  medications   Medication Sig Start Date End Date Taking? Authorizing Provider  albuterol (PROVENTIL) (2.5 MG/3ML) 0.083% nebulizer solution INHALE THE CONTENTS OF 1 VIAL VIA NEBULIZER TWO TO THREE TIMES DAILY 12/11/18   Fayrene Helper, MD  albuterol (VENTOLIN HFA) 108 (90 Base) MCG/ACT inhaler Inhale 2 puffs into the lungs every 6 (six) hours as needed for wheezing or shortness of breath. 02/09/18   Fayrene Helper, MD  feeding supplement, ENSURE ENLIVE, (ENSURE ENLIVE) LIQD Take 237 mLs by mouth 2 (two) times daily between meals. 11/30/17   Orson Eva, MD  Fluticasone-Salmeterol (ADVAIR) 250-50 MCG/DOSE AEPB Inhale 1 puff into the lungs 2 (two) times daily. 12/11/18 03/11/19  Fayrene Helper, MD  furosemide (LASIX) 20 MG tablet Take 1 tablet (20 mg total) by mouth daily. 11/07/18 11/08/19  Fayrene Helper, MD  guaiFENesin (MUCINEX) 600 MG 12 hr tablet Take 1 tablet (600 mg total) by mouth 2 (two) times daily. 06/14/18   Kathie Dike, MD  ipratropium (ATROVENT) 0.02 % nebulizer solution USE ONE VIAL IN NEBULIZER TWO TO THREE TIMES A DAY. 12/11/18   Fayrene Helper, MD  lovastatin (MEVACOR) 10 MG tablet TAKE 1 TABLET AT BEDTIME 11/21/18   Fayrene Helper, MD  mirtazapine (REMERON) 7.5 MG tablet TAKE 1 TABLET AT BEDTIME 02/13/19   Fayrene Helper, MD  potassium chloride (K-DUR) 10 MEQ tablet Take 1 tablet (  10 mEq total) by mouth daily. 12/04/18 12/05/19  Fayrene Helper, MD  predniSONE (DELTASONE) 5 MG tablet TAKE 1 TABLET (5 MG TOTAL) BY MOUTH DAILY. 02/13/19   Fayrene Helper, MD  ranitidine (ZANTAC) 150 MG tablet TAKE 1 TABLET EVERY DAY 12/25/18   Fayrene Helper, MD  tamsulosin (FLOMAX) 0.4 MG CAPS capsule Take 1 capsule (0.4 mg total) by mouth daily after supper. 10/05/18   Fayrene Helper, MD  UNABLE TO FIND Nebulizer machine and tubing  DX J44.1 11/28/17   Fayrene Helper, MD  Vitamin D, Ergocalciferol, (DRISDOL) 1.25 MG (50000 UT) CAPS capsule Take 1 capsule  by mouth once a week. 01/31/19   [provider]    Family History Family History  Problem Relation Age of Onset  . Heart attack Father   . Alcohol abuse Mother   . Hypertension Brother   . COPD Sister     Social History Social History   Tobacco Use  . Smoking status: Former Smoker    Packs/day: 1.00    Years: 25.00    Pack years: 25.00    Last attempt to quit: 11/24/1996    Years since quitting: 22.2  . Smokeless tobacco: Never Used  Substance Use Topics  . Alcohol use: No  . Drug use: No     Allergies   Patient has no known allergies.   Review of Systems Review of Systems  All other systems reviewed and are negative.    Physical Exam Updated Vital Signs BP (!) 119/93   Pulse (!) 110   Temp 98.7 F (37.1 C) (Rectal)   Resp 13   Ht 6' (1.829 m)   Wt 79.4 kg   SpO2 95%   BMI 23.73 kg/m   Physical Exam Vitals signs and nursing note reviewed.  Constitutional:      General: He is not in acute distress.    Appearance: He is well-developed. He is ill-appearing. He is not toxic-appearing or diaphoretic.  HENT:     Head: Normocephalic and atraumatic.     Right Ear: External ear normal.     Left Ear: External ear normal.     Nose: No congestion or rhinorrhea.     Mouth/Throat:     Pharynx: No oropharyngeal exudate or posterior oropharyngeal erythema.  Eyes:     General:        Right eye: Discharge present.        Left eye: Discharge present.    Pupils: Pupils are equal, round, and reactive to light.     Comments: Mild redness bilateral conjunctiva, with whitish discharge, bilaterally  Neck:     Musculoskeletal: Normal range of motion and neck supple.     Trachea: Phonation normal.  Cardiovascular:     Rate and Rhythm: Normal rate and regular rhythm.     Heart sounds: Normal heart sounds.  Pulmonary:     Effort: Pulmonary effort is normal. No respiratory distress.     Breath sounds: No stridor. No rhonchi.  Chest:     Chest wall: No  tenderness.  Abdominal:     General: There is no distension.     Palpations: Abdomen is soft.     Tenderness: There is no abdominal tenderness.  Genitourinary:    Comments: Brown stool in rectal vault. Musculoskeletal:     Comments: Marked bilateral lower leg swelling  Lymphadenopathy:     Cervical: No cervical adenopathy.  Skin:    General: Skin is warm and dry.  Comments: Bilateral buttocks redness, nonspecific but possibly early skin breakdown/stage I pressure sore.  Neurological:     Mental Status: He is alert and oriented to person, place, and time.     Cranial Nerves: No cranial nerve deficit.     Sensory: No sensory deficit.     Motor: No abnormal muscle tone.     Coordination: Coordination normal.  Psychiatric:        Mood and Affect: Mood normal.        Behavior: Behavior normal.        Thought Content: Thought content normal.        Judgment: Judgment normal.      ED Treatments / Results  Labs (all labs ordered are listed, but only abnormal results are displayed) Labs Reviewed  BLOOD GAS, VENOUS - Abnormal; Notable for the following components:      Result Value   pCO2, Ven 81.8 (*)    Bicarbonate 34.3 (*)    Acid-Base Excess 13.9 (*)    All other components within normal limits  COMPREHENSIVE METABOLIC PANEL - Abnormal; Notable for the following components:   CO2 35 (*)    BUN 24 (*)    Calcium 8.4 (*)    Total Protein 6.3 (*)    Albumin 2.8 (*)    All other components within normal limits  CBC WITH DIFFERENTIAL/PLATELET - Abnormal; Notable for the following components:   MCV 100.5 (*)    MCHC 29.9 (*)    Lymphs Abs 0.4 (*)    All other components within normal limits  BRAIN NATRIURETIC PEPTIDE - Abnormal; Notable for the following components:   B Natriuretic Peptide 182.0 (*)    All other components within normal limits  LACTIC ACID, PLASMA  LACTIC ACID, PLASMA  TROPONIN I  C-REACTIVE PROTEIN    EKG EKG Interpretation  Date/Time:  Sunday  February 18 2019 12:04:32 EDT Ventricular Rate:  105 PR Interval:    QRS Duration: 157 QT Interval:  358 QTC Calculation: 474 R Axis:   -89 Text Interpretation:  Atrial flutter RBBB and LAFB Since last tracing P waves not seen on this tracing Confirmed by Daleen Bo [phone]) on 02/18/2019 12:14:27 PM  This patients CHA2DS2-VASc Score and unadjusted Ischemic Stroke Rate (% per year) is equal to 3.2 % stroke rate/year from a score of 3 (age, HTN)  Above score calculated as 1 point each if present [CHF, HTN, DM, Vascular=MI/PAD/Aortic Plaque, Age if 65-74, or Male] Above score calculated as 2 points each if present [Age > 75, or Stroke/TIA/TE]   Radiology Dg Chest Port 1 View  Result Date: 02/18/2019 CLINICAL DATA:  Shortness of breath. EXAM: PORTABLE CHEST 1 VIEW COMPARISON:  Radiographs of June 13, 2018. FINDINGS: Stable cardiomediastinal silhouette. No pneumothorax or pleural effusion is noted. Stable emphysematous bulla formation is noted in right upper lobe. Mildly increased right basilar subsegmental atelectasis or infiltrate is noted. Left basilar scarring is noted. Bony thorax is unremarkable. IMPRESSION: Increased right basilar opacity is noted concerning for atelectasis or infiltrate. Electronically Signed   By: Marijo Conception, M.D.   On: 02/18/2019 13:20    Procedures Procedures (including critical care time)  Medications Ordered in ED Medications  furosemide (LASIX) injection 40 mg (40 mg Intravenous Given 02/18/19 1315)  albuterol (PROVENTIL HFA;VENTOLIN HFA) 108 (90 Base) MCG/ACT inhaler 6 puff (6 puffs Inhalation Given 02/18/19 1311)     Initial Impression / Assessment and Plan / ED Course  I have reviewed the triage vital  signs and the nursing notes.  Pertinent labs & imaging results that were available during my care of the patient were reviewed by me and considered in my medical decision making (see chart for details).  Clinical Course as of Feb 17 1521  Sun Feb 18, 2019  1152 For PPE protection, for evaluation   [EW]  1307 The patient is alert and feels somewhat ill at this time complains of mild shortness of breath.  He does not appear to be in respiratory distress at this time.  Lung exam, now-creased air movement bases bilaterally with adventitious sounds, nonspecific.  No wheezing, Rales or rhonchi.   [EW]  1308 Normal  Troponin I - Once [EW]  1308 Normal except PCO2 high  Blood gas, venous(!!) [EW]  1308 Normal except CO2 high, BUN high, calcium low, total protein low, albumin low  Comprehensive metabolic panel(!) [EW]  2706 Normal  Lactic acid, plasma [EW]  1309 Normal except MCV high  CBC with Differential(!) [EW]  1501 Lymphocytes: 9 [EW]    Clinical Course User Index [EW] Daleen Bo, MD        Patient Vitals for the past 24 hrs:  BP Temp Temp src Pulse Resp SpO2 Height Weight  02/18/19 1500 (!) 119/93 - - - 13 - - -  02/18/19 1430 105/89 - - (!) 110 19 95 % - -  02/18/19 1427 130/84 - - (!) 111 15 95 % - -  02/18/19 1333 119/83 - - (!) 130 (!) 22 95 % - -  02/18/19 1312 - - - - - 94 % - -  02/18/19 1300 (!) 125/57 - - 96 15 98 % - -  02/18/19 1230 - - - (!) 102 16 100 % - -  02/18/19 1200 133/79 - - (!) 115 20 98 % - -  02/18/19 1157 123/87 98.7 F (37.1 C) Rectal (!) 132 15 100 % - -  02/18/19 1155 - - - - - - 6' (1.829 m) 79.4 kg    2:57 PM Reevaluation with update and discussion. After initial assessment and treatment, an updated evaluation reveals at this time the patient states his breathing is "much better."  Heart rate 131, oxygen saturation 94% on 3 L nasal cannula.  Nursing states patient has voided almost 1 L following treatment with Lasix.  Patient transiently removed his oxygen while he was urinating, and his oxygen saturation dropped to the 70s.  Patient states he would feel better stay in the hospital for a day or 2, to see if he gets better. Daleen Bo   Medical Decision Making: Patient presenting for  evaluation of shortness of breath, on chronic oxygen therapy at home.  He also has marked leg edema.  No fever, no known sick contacts.  Chest x-ray nondiagnostic for pneumonia.  PCO2 elevated, with elevated serum bicarbonate, indicating chronic respiratory failure.  White count is normal.  No clear evidence for Covid-19 infection.  BiPap and steroids withheld initially, for period of observation.  At this time I do not believe that the patient has Covid-19.  Patient may benefit from steroids.  I decision withheld hospitalist to evaluate first.  Same decision made for BiPAP.  Kyle Mosley was evaluated in Emergency Department on 02/18/2019 for the symptoms described in the history of present illness. He was evaluated in the context of the global COVID-19 pandemic, which necessitated consideration that the patient might be at risk for infection with the SARS-CoV-2 virus that causes COVID-19. Institutional protocols  and algorithms that pertain to the evaluation of patients at risk for COVID-19 are in a state of rapid change based on information released by regulatory bodies including the CDC and federal and state organizations. These policies and algorithms were followed during the patient's care in the ED.   CRITICAL CARE-yes Performed by: Daleen Bo  Nursing Notes Reviewed/ Care Coordinated Applicable Imaging Reviewed Interpretation of Laboratory Data incorporated into ED treatment  3:03 PM-Consult complete with Hospitalist. Patient case explained and discussed.  She agrees to admit patient for further evaluation and treatment. Call ended at 3:20 PM  Plan: Admit  Final Clinical Impressions(s) / ED Diagnoses   Final diagnoses:  Acute on chronic respiratory failure with hypercapnia ([place])  Hypoxia  Chronic obstructive pulmonary disease, unspecified COPD type ([place])  Tachycardia  Atrial flutter, unspecified type [facility])    ED Discharge Orders    None       Daleen Bo, MD 02/18/19 8099     Daleen Bo, MD 02/19/19 [phone]

## 2019-02-18 NOTE — ED Notes (Signed)
Staff at [facility] long notified that pt was on the way from [facility]

## 2019-02-18 NOTE — H&P (Signed)
History and Physical    Kyle Mosley [id number]:[id number] DOB: 08-Oct-1935 DOA: 02/18/2019  PCP: Fayrene Helper, MD   Patient coming from: Home  I have personally briefly reviewed patient's old medical records in [place]  Chief Complaint: SOB, Cough  HPI: Kyle Mosley is a 84 y.o. male with medical history significant for Asthma and COPD, hypertension, and pulmonary hypertension, who presented to the ED with complaints of difficulty breathing since yesterday, and worsening cough productive of white sputum.  He denies fever or chills, no body aches, no recent travel or sick contacts.  No chest pain.  Patient also reported drainage from both his eyes. Patient also has bilateral lower extremity swelling worsening over the past month.  He tells me he is supposed to be on a fluid pill.  ED Course: Tachycardic to 132, O2 sats 94% on nasal cannula-3L, BNP elevated 182 compared to prior.  Lactic acid normal 0.8.  WBC normal 4.8.  5, chronically elevated.  EKG reads atrial flutter rate 105.  Two-view chest x-ray shows increased right basilar opacity noted concerning for atelectasis or infiltrate.  Patient given 40 mg IV Lasix x1 in ED, albuterol inhaler, given. With persistent tachycardia, and tachypnea with ambulation, patient also saying he doesn't think he can go home, wife is disabled, hospitalist was called to admit for COPD exacerbation.  Review of Systems: As per HPI all other systems reviewed and negative.  Past Medical History:  Diagnosis Date  . Asthma   . Colon polyps   . COPD (chronic obstructive pulmonary disease) ([place])    3L home O2  . Diverticulosis   . Enlarged prostate    with elevated PSA  . Hyperlipidemia   . Hypertension   . Prediabetes 2011  . Pulmonary hypertension ([place]) 2012    Past Surgical History:  Procedure Laterality Date  . COLONOSCOPY  03/21/2007   Dr. Jonny Ruiz diverticula, normal rectum  . COLONOSCOPY  05/04/2012   Procedure: COLONOSCOPY;   Surgeon: Daneil Dolin, MD;  Location: [facility];  Service: Endoscopy;  Laterality: N/A;  7:30  . PROSTATE BIOPSY       reports that he quit smoking about 22 years ago. He has a 25.00 pack-year smoking history. He has never used smokeless tobacco. He reports that he does not drink alcohol or use drugs.  No Known Allergies  Family History  Problem Relation Age of Onset  . Heart attack Father   . Alcohol abuse Mother   . Hypertension Brother   . COPD Sister     Prior to Admission medications   Medication Sig Start Date End Date Taking? Authorizing Provider  albuterol (PROVENTIL) (2.5 MG/3ML) 0.083% nebulizer solution INHALE THE CONTENTS OF 1 VIAL VIA NEBULIZER TWO TO THREE TIMES DAILY Patient taking differently: Take 2.5 mg by nebulization 3 (three) times daily.  12/11/18  Yes Fayrene Helper, MD  albuterol (VENTOLIN HFA) 108 (90 Base) MCG/ACT inhaler Inhale 2 puffs into the lungs every 6 (six) hours as needed for wheezing or shortness of breath. 02/09/18  Yes Fayrene Helper, MD  feeding supplement, ENSURE ENLIVE, (ENSURE ENLIVE) LIQD Take 237 mLs by mouth 2 (two) times daily between meals. 11/30/17  Yes Tat, Shanon Brow, MD  furosemide (LASIX) 20 MG tablet Take 1 tablet (20 mg total) by mouth daily. 11/07/18 11/08/19 Yes Fayrene Helper, MD  ipratropium (ATROVENT) 0.02 % nebulizer solution USE ONE VIAL IN NEBULIZER TWO TO THREE TIMES A DAY. Patient taking differently: Take 0.5  mg by nebulization 3 (three) times daily.  12/11/18  Yes Fayrene Helper, MD  lovastatin (MEVACOR) 10 MG tablet TAKE 1 TABLET AT BEDTIME Patient taking differently: Take 10 mg by mouth at bedtime.  11/21/18  Yes Fayrene Helper, MD  mirtazapine (REMERON) 7.5 MG tablet TAKE 1 TABLET AT BEDTIME Patient taking differently: Take 7.5 mg by mouth at bedtime.  02/13/19  Yes Fayrene Helper, MD  OXYGEN Inhale 3 L into the lungs continuous.   Yes [provider]  potassium chloride (K-DUR) 10 MEQ  tablet Take 1 tablet (10 mEq total) by mouth daily. 12/04/18 12/05/19 Yes Fayrene Helper, MD  predniSONE (DELTASONE) 5 MG tablet TAKE 1 TABLET (5 MG TOTAL) BY MOUTH DAILY. Patient taking differently: Take 5 mg by mouth daily with breakfast.  02/13/19  Yes Fayrene Helper, MD  ranitidine (ZANTAC) 150 MG tablet TAKE 1 TABLET EVERY DAY Patient taking differently: Take 150 mg by mouth every morning.  12/25/18  Yes Fayrene Helper, MD  tamsulosin (FLOMAX) 0.4 MG CAPS capsule Take 1 capsule (0.4 mg total) by mouth daily after supper. 10/05/18  Yes Fayrene Helper, MD  UNABLE TO FIND Nebulizer machine and tubing  DX J44.1 11/28/17  Yes Fayrene Helper, MD  Fluticasone-Salmeterol (ADVAIR) 250-50 MCG/DOSE AEPB Inhale 1 puff into the lungs 2 (two) times daily. Patient not taking: Reported on 02/18/2019 12/11/18 03/11/19  Fayrene Helper, MD    Physical Exam: Vitals:   02/18/19 1500 02/18/19 1630 02/18/19 1645 02/18/19 1700  BP: (!) 119/93 (!) 140/91  (!) 129/93  Pulse:  (!) 115 98   Resp: 13 16 16 17   Temp:      TempSrc:      SpO2:  97% 100%   Weight:      Height:       Constitutional: NAD, calm, comfortable Vitals:   02/18/19 1500 02/18/19 1630 02/18/19 1645 02/18/19 1700  BP: (!) 119/93 (!) 140/91  (!) 129/93  Pulse:  (!) 115 98   Resp: 13 16 16 17   Temp:      TempSrc:      SpO2:  97% 100%   Weight:      Height:       Eyes: PERRL, lids and conjunctivae normal ENMT: Mucous membranes are moist. Posterior pharynx clear of any exudate or lesions Neck: normal, supple, no masses, no thyromegaly Respiratory:  Markedly reduced air entry bilaterally, no crackles. Mild increased work of breathing. No accessory muscle use.  Cardiovascular: Regular rate and rhythm, no murmurs / rubs / gallops. 2+ pitting pedal edema to mid leg bilat, without redness or pain. 2+ pedal pulses.  Abdomen: no tenderness, no masses palpated. No hepatosplenomegaly. Bowel sounds positive.   Musculoskeletal: no clubbing / cyanosis. No joint deformity upper and lower extremities. Good ROM, no contractures. Normal muscle tone.  Skin: no rashes, lesions, ulcers. No induration Neurologic: CN 2-12 grossly intact.  Strength 5/5 in all 4.  Psychiatric: Normal judgment and insight. Alert and oriented x 3. Normal mood.   Labs on Admission: I have personally reviewed following labs and imaging studies  CBC: Recent Labs  Lab 02/18/19 1210  WBC 4.8  NEUTROABS 3.8  HGB 13.1  HCT 43.8  MCV 100.5*  PLT 270   Basic Metabolic Panel: Recent Labs  Lab 02/18/19 1210  NA 141  K 4.4  CL 98  CO2 35*  GLUCOSE 86  BUN 24*  CREATININE 0.77  CALCIUM 8.4*   Liver  Function Tests: Recent Labs  Lab 02/18/19 1210  AST 17  ALT 13  ALKPHOS 72  BILITOT 0.6  PROT 6.3*  ALBUMIN 2.8*   Cardiac Enzymes: Recent Labs  Lab 02/18/19 1210  TROPONINI <0.03    Radiological Exams on Admission: Dg Chest Port 1 View  Result Date: 02/18/2019 CLINICAL DATA:  Shortness of breath. EXAM: PORTABLE CHEST 1 VIEW COMPARISON:  Radiographs of June 13, 2018. FINDINGS: Stable cardiomediastinal silhouette. No pneumothorax or pleural effusion is noted. Stable emphysematous bulla formation is noted in right upper lobe. Mildly increased right basilar subsegmental atelectasis or infiltrate is noted. Left basilar scarring is noted. Bony thorax is unremarkable. IMPRESSION: Increased right basilar opacity is noted concerning for atelectasis or infiltrate. Electronically Signed   By: Marijo Conception, M.D.   On: 02/18/2019 13:20    EKG: Independently reviewed.  Atrial fibrillation, rate 105.  Old RBBB and LAFB.  Assessment/Plan Active Problems:   COPD exacerbation (HCC)    COPD exacerbation with possible pneumonia-dyspnea, productive cough.  Marked reduced air entry on exam.  Chest x-ray right base atelectasis versus infiltrate.  WBC 4.8.  Normal lactic acid 0.8.  On 3 L O2 at baseline.  Patient is low risk for  Co-Vid, but will rule out considering symptoms. - Influenza PCR panel, COVID-19 testing -IV ceftriaxone and azithromycin -IV Solu-Medrol 60 twice daily -Albuterol and ipratropium inhalers Q6H, PRN albuterol - Mucolytics, supplemental O2 -Fluticasone salmeterol inhaler twice daily  New onset atrial fibrillation-EKG without distinct P waves.  Rates up to 132, now improved.  Without rate limiting drugs.  EKG is in epic showing sinus rhythm.  Likely aggravated by COPD exacerbation, and possible pneumonia. -PRN IV metoprolol 5mg ,  With intermittent soft blood pressure - Held off on anticoagulation and ECHO at this time, may require if persistent  Diastolic CHF, pulmonary hypertension hx-BNP is elevated 182 from baseline.  Chest x-ray not suggestive of edema or congestion.  But he Has significant bilat lower extremity 2+ pitting pedal edema over the past month.  Per charts weights have gradually trended up over the past 6 months from 158-1 7 5Lbs  Today.  Last echo 11/2017 EF 55 to 60%, G1DD, PA pressure not evaluated. LE swelling likely more from Cor pulmonale, Pulm HTN.Marland Kitchen  Home meds- 20 mg Lasix daily.  40 mg IV Lasix given in ED. - Will cont IV Lasix 40 mg q12h -Strict input output, daily weights -BMP a.m.  HTN-systolic 161-096E. Not on home hypertensives except tamsulosin. -Continue home tamsulosin   DVT prophylaxis: Lovenox Code Status: Full confirmed with patient. Family Communication: None at bedside Disposition Plan: 1-2 days Consults called: None Admission status: Obs, tele  Bethena Roys MD [facility]  02/18/2019, 5:20 PM

## 2019-02-19 DIAGNOSIS — J449 Chronic obstructive pulmonary disease, unspecified: Secondary | ICD-10-CM

## 2019-02-19 DIAGNOSIS — R0902 Hypoxemia: Secondary | ICD-10-CM | POA: Diagnosis not present

## 2019-02-19 DIAGNOSIS — I4892 Unspecified atrial flutter: Secondary | ICD-10-CM

## 2019-02-19 LAB — BASIC METABOLIC PANEL
Anion gap: 7 (ref 5–15)
BUN: 23 mg/dL (ref 8–23)
CO2: 40 mmol/L — ABNORMAL HIGH (ref 22–32)
Calcium: 8.6 mg/dL — ABNORMAL LOW (ref 8.9–10.3)
Chloride: 94 mmol/L — ABNORMAL LOW (ref 98–111)
Creatinine, Ser: 0.84 mg/dL (ref 0.61–1.24)
GFR calc Af Amer: >60 mL/min (ref >60)
GFR calc non Af Amer: >60 mL/min (ref >60)
Glucose, Bld: 140 mg/dL — ABNORMAL HIGH (ref 70–99)
Potassium: 5 mmol/L (ref 3.5–5.1)
Sodium: 141 mmol/L (ref 135–145)

## 2019-02-19 LAB — C-REACTIVE PROTEIN: CRP: 2 mg/dL — ABNORMAL HIGH (ref <1.0)

## 2019-02-19 LAB — GLUCOSE, CAPILLARY: Glucose-Capillary: 122 mg/dL — ABNORMAL HIGH (ref 70–99)

## 2019-02-19 MED ORDER — POLYETHYLENE GLYCOL 3350 17 G PO PACK
17.0000 g | PACK | Freq: Every day | ORAL | Status: DC
  Administered 2019-02-19: 17 g via ORAL
  Filled 2019-02-19: qty 1

## 2019-02-19 MED ORDER — RIVAROXABAN 20 MG PO TABS: 20.0000 mg | ORAL_TABLET | Freq: Every day | ORAL | 0 refills | Status: DC

## 2019-02-19 MED ORDER — RIVAROXABAN 20 MG PO TABS
20.0000 mg | ORAL_TABLET | Freq: Every day | ORAL | Status: DC
  Administered 2019-02-19: 20 mg via ORAL
  Filled 2019-02-19: qty 1

## 2019-02-19 MED ORDER — ENSURE ENLIVE PO LIQD
237.0000 mL | Freq: Two times a day (BID) | ORAL | Status: DC
  Administered 2019-02-19: 237 mL via ORAL

## 2019-02-19 MED ORDER — AZITHROMYCIN 250 MG PO TABS: ORAL_TABLET | ORAL | 0 refills | Status: AC

## 2019-02-19 MED ORDER — CEFDINIR 300 MG PO CAPS: 300.0000 mg | ORAL_CAPSULE | Freq: Two times a day (BID) | ORAL | 0 refills | Status: AC

## 2019-02-19 MED ORDER — POLYETHYLENE GLYCOL 3350 17 G PO PACK: 17.0000 g | PACK | Freq: Every day | ORAL | Status: DC

## 2019-02-19 NOTE — Discharge Summary (Addendum)
Physician Discharge Summary  Kyle Mosley LZJ:[id number] DOB: 1935-02-22 DOA: 02/18/2019  PCP: Fayrene Helper, MD  Admit date: 02/18/2019 Discharge date: 02/19/2019  Admitted From: Home Disposition:  Home  Recommendations for Outpatient Follow-up:  1. Follow up with PCP in 1-2 weeks 2. Please note, pt started on prophylactic xarelto for afib per Cardiology recommendation  Discharge Condition:Improved CODE STATUS:Full Diet recommendation: Heart healthy   Brief/Interim Summary: 84 y.o. male with medical history significant for Asthma and COPD, hypertension, and pulmonary hypertension, who presented to the ED with complaints of difficulty breathing since yesterday, and worsening cough productive of white sputum.  He denies fever or chills, no body aches, no recent travel or sick contacts.  No chest pain.  Patient also reported drainage from both his eyes. Patient also has bilateral lower extremity swelling worsening over the past month.  He tells me he is supposed to be on a fluid pill.  ED Course: Tachycardic to 132, O2 sats 94% on nasal cannula-3L, BNP elevated 182 compared to prior.  Lactic acid normal 0.8.  WBC normal 4.8.  5, chronically elevated.  EKG reads atrial flutter rate 105.  Two-view chest x-ray shows increased right basilar opacity noted concerning for atelectasis or infiltrate.  Patient given 40 mg IV Lasix x1 in ED, albuterol inhaler, given. With persistent tachycardia, and tachypnea with ambulation, patient also saying he doesn't think he can go home, wife is disabled, hospitalist was called to admit for COPD exacerbation.   Discharge Diagnoses:  Active Problems:   COPD exacerbation (HCC)   COPD exacerbation with possible pneumonia-dyspnea, productive cough.  Marked reduced air entry on exam at time of presentation.  Chest x-ray right base atelectasis versus infiltrate.  WBC 4.8.  Normal lactic acid 0.8.  On 3 L O2 at baseline.  Patient is low risk for Co-Vid,  however COVID testing underway given presenting sx. - Influenza PCR panel neg, COVID-19 testing pending -initially continued on IV ceftriaxone and azithromycin -Given IV Solu-Medrol 60 twice daily - Albuterol and ipratropium inhalers Q6H, PRN albuterol - Given Mucolytics, supplemental O2 -Fluticasone salmeterol inhaler twice daily  New onset atrial fibrillation- -EKG without distinct P waves.   -Continued on PRN IV metoprolol 5mg ,  With intermittent soft blood pressure -Reviewed EKG with Cardiology, confirms afib at time of presentation. Recommendation for anticoagulation at time of d/c  Diastolic CHF, pulmonary hypertension hx -BNP is elevated 182 from baseline.   -Last echo 11/2017 EF 55 to 60%, G1DD, PA pressure not evaluated. LE swelling likely more from Cor pulmonale, Pulm HTN..   -Pt was cont on IV Lasix 40 mg q12h while in hospital  HTN -systolic BP noted to [id number]. -Continue home tamsulosin -Stable at this time  COVID 19 neg  Discharge Instructions   Allergies as of 02/19/2019   No Known Allergies     Medication List    TAKE these medications   albuterol 108 (90 Base) MCG/ACT inhaler Commonly known as:  Ventolin HFA Inhale 2 puffs into the lungs every 6 (six) hours as needed for wheezing or shortness of breath. What changed:  Another medication with the same name was changed. Make sure you understand how and when to take each.   albuterol (2.5 MG/3ML) 0.083% nebulizer solution Commonly known as:  PROVENTIL INHALE THE CONTENTS OF 1 VIAL VIA NEBULIZER TWO TO THREE TIMES DAILY What changed:    how much to take  how to take this  when to take this  additional instructions   azithromycin  250 MG tablet Commonly known as:  Zithromax Z-Pak Take 2 tablets (500 mg) on  Day 1,  followed by 1 tablet (250 mg) once daily on Days 2 through 5.   cefdinir 300 MG capsule Commonly known as:  OMNICEF Take 1 capsule (300 mg total) by mouth 2 (two) times daily for 5  days.   feeding supplement (ENSURE ENLIVE) Liqd Take 237 mLs by mouth 2 (two) times daily between meals.   Fluticasone-Salmeterol 250-50 MCG/DOSE Aepb Commonly known as:  ADVAIR Inhale 1 puff into the lungs 2 (two) times daily.   furosemide 20 MG tablet Commonly known as:  LASIX Take 1 tablet (20 mg total) by mouth daily.   ipratropium 0.02 % nebulizer solution Commonly known as:  ATROVENT USE ONE VIAL IN NEBULIZER TWO TO THREE TIMES A DAY. What changed:    how much to take  how to take this  when to take this  additional instructions   lovastatin 10 MG tablet Commonly known as:  MEVACOR TAKE 1 TABLET AT BEDTIME   mirtazapine 7.5 MG tablet Commonly known as:  REMERON TAKE 1 TABLET AT BEDTIME   OXYGEN Inhale 3 L into the lungs continuous.   potassium chloride 10 MEQ tablet Commonly known as:  K-DUR Take 1 tablet (10 mEq total) by mouth daily.   predniSONE 5 MG tablet Commonly known as:  DELTASONE TAKE 1 TABLET (5 MG TOTAL) BY MOUTH DAILY. What changed:  See the new instructions.   ranitidine 150 MG tablet Commonly known as:  ZANTAC TAKE 1 TABLET EVERY DAY What changed:  when to take this   rivaroxaban 20 MG Tabs tablet Commonly known as:  XARELTO Take 1 tablet (20 mg total) by mouth daily with supper for 30 days.   tamsulosin 0.4 MG Caps capsule Commonly known as:  FLOMAX TAKE 1 CAPSULE  DAILY AFTER SUPPER. What changed:  See the new instructions.   UNABLE TO FIND Nebulizer machine and tubing  DX J44.1       No Known Allergies  Consultations:  Reviewed EKG with Cardiology  Procedures/Studies: Dg Chest Port 1 View  Result Date: 02/18/2019 CLINICAL DATA:  Shortness of breath. EXAM: PORTABLE CHEST 1 VIEW COMPARISON:  Radiographs of June 13, 2018. FINDINGS: Stable cardiomediastinal silhouette. No pneumothorax or pleural effusion is noted. Stable emphysematous bulla formation is noted in right upper lobe. Mildly increased right basilar  subsegmental atelectasis or infiltrate is noted. Left basilar scarring is noted. Bony thorax is unremarkable. IMPRESSION: Increased right basilar opacity is noted concerning for atelectasis or infiltrate. Electronically Signed   By: Marijo Conception, M.D.   On: 02/18/2019 13:20    Subjective: Eager to go home  Discharge Exam: Vitals:   02/19/19 0441 02/19/19 1157  BP: 113/65 125/73  Pulse: 85 77  Resp: 14 18  Temp: 97.9 F (36.6 C) 97.8 F (36.6 C)  SpO2: 99% 98%   Vitals:   02/18/19 2218 02/18/19 2248 02/19/19 0441 02/19/19 1157  BP: (!) 129/94  113/65 125/73  Pulse: 95  85 77  Resp: 14  14 18   Temp: 98.2 F (36.8 C)  97.9 F (36.6 C) 97.8 F (36.6 C)  TempSrc: Oral  Oral Oral  SpO2: 99% 99% 99% 98%  Weight:      Height:        General: Pt is alert, awake, not in acute distress Cardiovascular: RRR, S1/S2 +, no rubs, no gallops Respiratory: CTA bilaterally, no wheezing, no rhonchi Abdominal: Soft, NT, ND, bowel  sounds + Extremities: no edema, no cyanosis   The results of significant diagnostics from this hospitalization (including imaging, microbiology, ancillary and laboratory) are listed below for reference.     Microbiology: No results found for this or any previous visit (from the past 240 hour(s)).   Labs: BNP (last 3 results) Recent Labs    06/13/18 1039 02/18/19 1210  BNP 95.0 165.7*   Basic Metabolic Panel: Recent Labs  Lab 02/18/19 1210 02/19/19 0428  NA 141 141  K 4.4 5.0  CL 98 94*  CO2 35* 40*  GLUCOSE 86 140*  BUN 24* 23  CREATININE 0.77 0.84  CALCIUM 8.4* 8.6*   Liver Function Tests: Recent Labs  Lab 02/18/19 1210  AST 17  ALT 13  ALKPHOS 72  BILITOT 0.6  PROT 6.3*  ALBUMIN 2.8*   No results for input(s): LIPASE, AMYLASE in the last 168 hours. No results for input(s): AMMONIA in the last 168 hours. CBC: Recent Labs  Lab 02/18/19 1210  WBC 4.8  NEUTROABS 3.8  HGB 13.1  HCT 43.8  MCV 100.5*  PLT 188   Cardiac  Enzymes: Recent Labs  Lab 02/18/19 1210  TROPONINI <0.03   BNP: Invalid input(s): POCBNP CBG: Recent Labs  Lab 02/19/19 1642  GLUCAP 122*   D-Dimer No results for input(s): DDIMER in the last 72 hours. Hgb A1c No results for input(s): HGBA1C in the last 72 hours. Lipid Profile No results for input(s): CHOL, HDL, LDLCALC, TRIG, CHOLHDL, LDLDIRECT in the last 72 hours. Thyroid function studies No results for input(s): TSH, T4TOTAL, T3FREE, THYROIDAB in the last 72 hours.  Invalid input(s): FREET3 Anemia work up No results for input(s): VITAMINB12, FOLATE, FERRITIN, TIBC, IRON, RETICCTPCT in the last 72 hours. Urinalysis No results found for: COLORURINE, APPEARANCEUR, LABSPEC, [place], GLUCOSEU, HGBUR, BILIRUBINUR, KETONESUR, PROTEINUR, UROBILINOGEN, NITRITE, LEUKOCYTESUR Sepsis Labs Invalid input(s): PROCALCITONIN,  WBC,  LACTICIDVEN Microbiology No results found for this or any previous visit (from the past 240 hour(s)).  Time spent: 56min   SIGNED:   Marylu Lund, MD  [facility] 02/19/2019, 5:20 PM  If 7PM-7AM, please contact night-coverage

## 2019-02-19 NOTE — TOC Benefit Eligibility Note (Signed)
Transition of Care [facility]) Benefit Eligibility Note    Patient Details  Name: Kyle Mosley MRN: [id number] Date of Birth: [birth year]   Medication/Dose: Xarelto 20 mg  Covered?: Yes  Tier: 3 Drug  Prescription Coverage Preferred Pharm[facility]: [facility] [address] with Person/Company/Phone Number:: [place]/ [employer] [facility]/ [facility]  Co-Pay: $45.00 retail mail order $125.00  Prior Approval: No          Kerin Salen Phone Number: 02/19/2019, 4:34 PM

## 2019-02-19 NOTE — Care Management Obs Status (Signed)
[facility] NOTIFICATION   Patient Details  Name: AMEDIO BOWLBY MRN: [id number] Date of Birth: 1935/01/21   Medicare Observation Status Notification Given:  Yes    McGibboneyOletta Darter, RN 02/19/2019, 4:07 PM

## 2019-02-19 NOTE — Progress Notes (Signed)
ANTICOAGULATION CONSULT NOTE - Initial Consult  Pharmacy Consult for Rivaroxaban Indication: atrial fibrillation  No Known Allergies  Patient Measurements: Height: 6' (182.9 cm) Weight: 159 lb 9.8 oz (72.4 kg) IBW/kg (Calculated) : 77.6  Vital Signs: Temp: 97.8 F (36.6 C) (03/30 1157) Temp Source: Oral (03/30 1157) BP: 125/73 (03/30 1157) Pulse Rate: 77 (03/30 1157)  Labs: Recent Labs    02/18/19 1210 02/19/19 0428  HGB 13.1  --   HCT 43.8  --   PLT 188  --   CREATININE 0.77 0.84  TROPONINI <0.03  --     Estimated Creatinine Clearance: 69.4 mL/min (by C-G formula based on SCr of 0.84 mg/dL).   Medical History: Past Medical History:  Diagnosis Date  . Asthma   . Colon polyps   . COPD (chronic obstructive pulmonary disease) ([place])    3L home O2  . Diverticulosis   . Enlarged prostate    with elevated PSA  . Hyperlipidemia   . Hypertension   . Prediabetes 2011  . Pulmonary hypertension ([place]) 2012    Assessment: 24 yoM admitted on 3/29 with COPD, PNA.  Found to have new onset Afib and Pharmacy is consulted to dose Rivaroxaban.  No PTA anticoagulation noted. Lovenox for VTE prophylaxis started on admission, most recent dose on 3/29 at 2200.  Today, 02/19/2019: CBC:  Hgb 13.1, Plt 188 SCr 0.84 with CrCl ~ 70 ml/min  Goal of Therapy:  Monitor platelets by anticoagulation protocol: Yes   Plan:   D/C Lovenox.  Xarelto 20 mg PO daily with supper.   Pharmacy has provided a 30-day free trial card to the patient on 3/30 and counseling was provided over the phone to both the patient at [facility] and his Wife Parke Simmers at home.  Dosage remains stable and need for further dosage adjustment appears unlikely at present.  Pharmacy will sign off at this time.  Please reconsult if a change in clinical status warrants re-evaluation of dosage.  Gretta Arab PharmD, BCPS Pager [phone] 02/19/2019 4:30 PM

## 2019-02-19 NOTE — Plan of Care (Signed)

## 2019-02-19 NOTE — Discharge Instructions (Signed)

## 2019-02-19 NOTE — TOC Benefit Eligibility Note (Signed)
Transition of Care [facility]) Benefit Eligibility Note    Patient Details  Name: Kyle Mosley MRN: [id number] Date of Birth: May 03, 1935   Medication/Dose: Xarelto 20 mg  Covered?: Yes  Tier: 3 Drug  Prescription Coverage [facility] [address] with Person/Company/Phone Number:: [place]/ [employer] [facility]/ [facility] [phone]  Co-Pay: $45.00 retail mail order $125.00  Prior Approval: No          Kerin Salen Phone Number: 02/19/2019, 5:02 PM

## 2019-02-19 NOTE — TOC Initial Note (Signed)
Transition of Care [facility]) - Initial/Assessment Note    Patient Details  Name: Kyle Mosley MRN: [id number] Date of Birth: 1935-03-22  Transition of [facility]) CM/SW Contact:    Purcell Mouton, RN Phone Number: 02/19/2019, 4:13 PM  Clinical Narrative:                   Expected Discharge Plan: Home/Self Care     Patient Goals and CMS Choice Patient states their goals for this hospitalization and ongoing recovery are:: To feel better CMS Medicare.gov Compare Post Acute Care list provided to:: Patient Choice offered to / list presented to : Patient  Expected Discharge Plan and Services Expected Discharge Plan: Home/Self Care       Living arrangements for the past 2 months: Single Family Home                          Prior Living Arrangements/Services Living arrangements for the past 2 months: Single Family Home Lives with:: Spouse Patient language and need for interpreter reviewed:: No Do you feel safe going back to the place where you live?: Yes      Need for Family Participation in Patient Care: No (Comment)        Activities of Daily Living Home Assistive Devices/Equipment: Cane (specify quad or straight), CPAP, Walker (specify type), Bedside commode/3-in-1 ADL Screening (condition at time of admission) Patient's cognitive ability adequate to safely complete daily activities?: Yes Is the patient deaf or have difficulty hearing?: No Does the patient have difficulty seeing, even when wearing glasses/contacts?: No Does the patient have difficulty concentrating, remembering, or making decisions?: No Patient able to express need for assistance with ADLs?: Yes Does the patient have difficulty dressing or bathing?: No Independently performs ADLs?: Yes (appropriate for developmental age) Does the patient have difficulty walking or climbing stairs?: No Weakness of Legs: None Weakness of Arms/Hands: None  Permission Sought/Granted                   Emotional Assessment Appearance:: Appears younger than stated age   Affect (typically observed): Accepting Orientation: : Oriented to Self, Oriented to Place, Oriented to  Time, Oriented to Situation      Admission diagnosis:  Tachycardia [R00.0] Hypoxia [R09.02] Acute on chronic respiratory failure with hypercapnia (HCC) [J96.22] Chronic obstructive pulmonary disease, unspecified COPD type ([place]) [J44.9] COPD exacerbation ([place]) [J44.1] Patient Active Problem List   Diagnosis Date Noted  . COPD exacerbation ([place]) 02/18/2019  . Bilateral leg edema 09/10/2018  . Protein-calorie malnutrition, severe 11/29/2017  . Aortic aneurysm ([place])   . Hypoxia 12/28/2016  . Loss of weight 05/27/2016  . Elevated PSA 03/26/2015  . Reduced vision 05/27/2014  . Repeated falls 09/08/2013  . Pulmonary HTN ([place]) 06/07/2013  . COPD (chronic obstructive pulmonary disease) ([place]) 05/08/2013  . Primary generalized (osteo)arthritis 01/01/2013  . Bronchial asthma 09/30/2009  . FATIGUE 06/23/2009  . POLYP, COLON 04/08/2008  . Hyperlipidemia LDL goal <100 04/08/2008   PCP:  Fayrene Helper, MD Pharmacy:   [id number] - [place], [address] [place] [address] Phone: [phone] Fax: [facility] [place], [address] [place] [address] Phone: [phone] Fax: [phone]     Social Determinants of Health (SDOH) Interventions    Readmission Risk Interventions No flowsheet data found.

## 2019-02-19 NOTE — TOC Benefit Eligibility Note (Signed)
Transition of Care [facility]) Benefit Eligibility Note    Patient Details  Name: Kyle Mosley MRN: [id number] Date of Birth: [birth year]   Medication/Dose: Xarelto 20 mg  Covered?: Yes  Tier: 3 Drug  Prescription Coverage [facility] [address] [facility] with Person/Company/Phone Number:: [place]/ [employer]/ [facility]  Co-Pay: $45.00 retail mail order $125.00  Prior Approval: No          Kerin Salen Phone Number: 02/19/2019, 5:06 PM

## 2019-02-21 LAB — NOVEL CORONAVIRUS, NAA (HOSP ORDER, SEND-OUT TO REF LAB; TAT 18-24 HRS): SARS-CoV-2, NAA: NOT DETECTED

## 2019-03-04 DIAGNOSIS — J449 Chronic obstructive pulmonary disease, unspecified: Secondary | ICD-10-CM | POA: Diagnosis not present

## 2019-03-04 DIAGNOSIS — E785 Hyperlipidemia, unspecified: Secondary | ICD-10-CM | POA: Diagnosis not present

## 2019-03-06 ENCOUNTER — Telehealth: Payer: Self-pay | Admitting: *Deleted

## 2019-03-06 NOTE — Telephone Encounter (Signed)
Pts wife Parke Simmers called. She stated they had been notified about a recall on his ranitidine. Wanted to know if Dr. Moshe Cipro would send in something different. Also pt was in the hospital and discharged on 3-30 put in an appt for follow up however she stated that he was tested for COVID and they had never heard from the test results. Wanted to know how they would get those.

## 2019-03-06 NOTE — Telephone Encounter (Signed)
Patient aware of negative results and that med would be changed during appt

## 2019-03-08 ENCOUNTER — Other Ambulatory Visit: Payer: Self-pay

## 2019-03-08 ENCOUNTER — Encounter: Payer: Self-pay | Admitting: Family Medicine

## 2019-03-08 ENCOUNTER — Ambulatory Visit (INDEPENDENT_AMBULATORY_CARE_PROVIDER_SITE_OTHER): Payer: Medicare HMO | Admitting: Family Medicine

## 2019-03-08 DIAGNOSIS — I4891 Unspecified atrial fibrillation: Secondary | ICD-10-CM | POA: Diagnosis not present

## 2019-03-08 DIAGNOSIS — J438 Other emphysema: Secondary | ICD-10-CM

## 2019-03-08 NOTE — Patient Instructions (Addendum)
    Thank you for completing your hospital follow-up appointment today via the telephone. I appreciate the opportunity to provide you with the care for your health and wellness. Today we discussed:      [place] YOUR HANDS WELL AND FREQUENTLY. AVOID TOUCHING YOUR FACE, UNLESS YOUR HANDS ARE FRESHLY WASHED.  GET FRESH AIR DAILY. STAY HYDRATED WITH WATER.   It was a pleasure to see you and I look forward to continuing to work together on your health and well-being. Please do not hesitate to call the office if you need care or have questions about your care.  Have a wonderful day and week. With Gratitude, Cherly Beach, DNP, AGNP-BC

## 2019-03-08 NOTE — Progress Notes (Signed)
Virtual Visit via Telephone Note  I connected with Kyle Mosley on 03/08/19 at 11:20 AM EDT by telephone and verified that I am speaking with the correct person using two identifiers.   I discussed the limitations, risks, security and privacy concerns of performing an evaluation and management service by telephone and the availability of in person appointments. I also discussed with the patient that there may be a patient responsible charge related to this service. The patient expressed understanding and agreed to proceed.  Location of Patient: Home Location of Provider: Telehealth Consent was obtain for visit to be over via telehealth.  History of Present Illness: Kyle Mosley is an 84 year old male patient of Dr. Griffin Dakin.  Who is well-known to the clinic.  He has a significant history of asthma, COPD, hypertension, pulmonary hypertension.  He had presented to the emergency room back at the end of March and was admitted secondary to having being found that he had COPD exacerbation with possible pneumonia, onset atrial/flutter, diastolic congestive heart failure, pulmonary hypertension history.  He had dyspnea with a productive cough.  With marked reduced entry into his airway.  And his x-ray was positive for atelectasis versus infiltrate.  WBCs were normal along with a normal lactic acid.  And was on baseline 3 L of O2.  Was given medication IV.  Was started on mucolytic's, Advair, and was transitioned to p.o. antibiotics at discharge.  Which he reports that he completed.  EKG was without distinct P waves while he was in the hospital.  He was started on Xarelto and was maintained to stay on Xarelto at discharge.  He reports that he believes he is still taking this.  But he is not sure as his wife handles his medications.   He reports that the fluid in his legs are better.  He is taking his p.o. Lasix daily.  And has not had any other problems.  He reports that he is urinating very well after taking his  fluid pill.  He denies having any signs and symptoms of previous signs at emergency room.  Does report that he continues to have a cough but that sometimes this is baseline.  Denies having any excessive shortness of breath.  Denies having any chest pain, chest tightness, wheezing, fevers, chills.  Denies having any excessive leg swelling has trace leg swelling per him.  Overall he reports that he is doing better.  Has no other complaints to discuss today during his hospital follow-up visit. Past Medical, Surgical, Social History, Allergies, and Medications have been Reviewed.   Review of Systems  Constitutional: Negative for activity change, appetite change, chills and fever.  HENT: Negative.   Eyes: Negative.   Respiratory: Positive for cough. Negative for chest tightness, shortness of breath and wheezing.   Cardiovascular: Positive for leg swelling. Negative for chest pain and palpitations.  Gastrointestinal: Negative.   Endocrine: Negative.   Genitourinary: Negative.   Musculoskeletal: Negative.   Skin: Negative.   Allergic/Immunologic: Negative.   Neurological: Negative.   Hematological: Negative.   Psychiatric/Behavioral: Negative.   All other systems reviewed and are negative.  Observations/Objective: Physical Exam Psychiatric:        Attention and Perception: Attention normal.        Mood and Affect: Mood normal.        Speech: Speech normal.        Behavior: Behavior is cooperative.        Thought Content: Thought content normal.     Comments: Good  communication while on the phone.  Questionable as to whether or not he is a good historian for all the information that was provided.  Reports that his wife helps handle his medications.     Assessment and Plan: 1. Other emphysema ([place]) Stable, reports that he is doing well.  He does not have any trouble at this time.  Feels like he is cough is back to his baseline.  He is not having breathing difficulties or shortness of  breath.  Advised to continue the current regime of his inhalers at this time.  And if he has any troubles please do not hesitate to reach out to call us and let us know.  2. Atrial fibrillation, unspecified type (HCC) Stable, New-onset atrial fibrillation, is on Xarelto.  He believes.  His wife handles most of his medications.  Denies having any signs of symptoms of shortness of breath, chest pain, palpitations, reports that his leg swelling is to a minimal.  And that he is taking his fluid pill and other medications as directed.  Given his history of cardiac findings while inpatient will look to refer him for cardiology follow-up pending discussion with Dr Moshe Cipro.    Follow Up Instructions: AVS printed and mailed   I discussed the assessment and treatment plan with the patient. The patient was provided an opportunity to ask questions and all were answered. The patient agreed with the plan and demonstrated an understanding of the instructions.   The patient was advised to call back or seek an in-person evaluation if the symptoms worsen or if the condition fails to improve as anticipated.  I provided 10 minutes of non-face-to-face time during this encounter.   Perlie Mayo, NP

## 2019-03-29 ENCOUNTER — Ambulatory Visit (INDEPENDENT_AMBULATORY_CARE_PROVIDER_SITE_OTHER): Payer: Medicare HMO | Admitting: Family Medicine

## 2019-03-29 ENCOUNTER — Other Ambulatory Visit: Payer: Self-pay | Admitting: Family Medicine

## 2019-03-29 ENCOUNTER — Encounter: Payer: Self-pay | Admitting: Family Medicine

## 2019-03-29 VITALS — BP 118/60 | Ht 72.0 in | Wt 166.0 lb

## 2019-03-29 DIAGNOSIS — J438 Other emphysema: Secondary | ICD-10-CM

## 2019-03-29 DIAGNOSIS — Z7901 Long term (current) use of anticoagulants: Secondary | ICD-10-CM | POA: Diagnosis not present

## 2019-03-29 DIAGNOSIS — E559 Vitamin D deficiency, unspecified: Secondary | ICD-10-CM | POA: Diagnosis not present

## 2019-03-29 DIAGNOSIS — I4819 Other persistent atrial fibrillation: Secondary | ICD-10-CM | POA: Diagnosis not present

## 2019-03-29 DIAGNOSIS — I272 Pulmonary hypertension, unspecified: Secondary | ICD-10-CM

## 2019-03-29 DIAGNOSIS — E785 Hyperlipidemia, unspecified: Secondary | ICD-10-CM | POA: Diagnosis not present

## 2019-03-29 DIAGNOSIS — R0902 Hypoxemia: Secondary | ICD-10-CM

## 2019-03-29 DIAGNOSIS — G4701 Insomnia due to medical condition: Secondary | ICD-10-CM | POA: Diagnosis not present

## 2019-03-29 MED ORDER — PANTOPRAZOLE SODIUM 20 MG PO TBEC: 20.0000 mg | DELAYED_RELEASE_TABLET | Freq: Every day | ORAL | 5 refills | Status: AC

## 2019-03-29 NOTE — Patient Instructions (Addendum)
Welness past due , please schedule  MD follow up end September, call if you need me sooner  Please get fasting lipid, cmp and EGFR, TSH and vit D 1 week before your September appointment Instead of zantac/ Ranitidine, pantoprazole is prescribed , do NOT take ranitidine anymore  Continue to follow guidelines to reduce your risk of getting COVID 19 exposure , thankful that you are keeping well  Be careful not to fall, and please try to eat on a regular schedule  Thanks for choosing [facility], we consider it a privelige to serve you.

## 2019-03-29 NOTE — Progress Notes (Signed)
Virtual Visit via Telephone Note  I connected with Kyle Mosley on 03/29/19 at  3:00 PM EDT by telephone and verified that I am speaking with the correct person using two identifiers.  Location: Patient:home Provider: ioffice   I discussed the limitations, risks, security and privacy concerns of performing an evaluation and management service by telephone and the availability of in person appointments. I also discussed with the patient that there may be a patient responsible charge related to this service. The patient expressed understanding and agreed to proceed.   History of Present Illness: F/u chronic problems , medication review and labs if any I need to stop ranitidine Overall I am doing well, staying at home, no fever, increased shortness of breath, sputum or body aches Denies recent fever or chills. Denies sinus pressure, nasal congestion, ear pain or sore throat. Chronic severe difficulty breathing  And oxygen dependent, denies sputum , fever or shills Denies chest pains, palpitations and leg swelling Denies abdominal pain, nausea, vomiting,diarrhea or constipation.   Denies dysuria, frequency, hesitancy or incontinence. C/o chronic  limitation in mobility.due both to arthritis as well as shortness of breath Denies headaches, seizures, numbness, or tingling. Denies depression, anxiety or insomnia. Denies skin break down or rash.       Observations/Objective: BP 118/60   Ht 6' (1.829 m)   Wt 166 lb (75.3 kg)   BMI 22.51 kg/m  Good communication with no confusion and intact memory. Alert and oriented x 3 Signs of respiratory distress during speech which is his baseline, unable to carry out a conversation without shortness of breath    Assessment and Plan:  Atrial fibrillation ([place]) Review of d/ c summary from recent hospitalization, in March , 2020, chronic anticoagulation is recommended , will continue daily xarelto, script sent in  Hyperlipidemia LDL goal  <100 Hyperlipidemia:Low fat diet discussed and encouraged.   Lipid Panel  Lab Results  Component Value Date   CHOL 127 09/22/2017   HDL 86 09/22/2017   LDLCALC 30 09/22/2017   TRIG 38 09/22/2017   CHOLHDL 1.5 09/22/2017     Updated lab needed at/ before next visit.   Hypoxia Maintained on supplemental oxygen 24 hr/ day  COPD (chronic obstructive pulmonary disease) (HCC) Severe , requires daily prednisone , supplemental oxygen and regular neb treatments, chronoic dyspnea and poor exercise tolerance , however reports being stable at this time   Pulmonary HTN ([place]) Maintained on supplemental oxygen and diuretic  Insomnia due to medical condition Continue Remeron at bedtime   Follow Up Instructions:    I discussed the assessment and treatment plan with the patient. The patient was provided an opportunity to ask questions and all were answered. The patient agreed with the plan and demonstrated an understanding of the instructions.   The patient was advised to call back or seek an in-person evaluation if the symptoms worsen or if the condition fails to improve as anticipated.  I provided 25 minutes of non-face-to-face time during this encounter.   Tula Nakayama, MD

## 2019-03-31 ENCOUNTER — Telehealth: Payer: Self-pay | Admitting: Family Medicine

## 2019-03-31 ENCOUNTER — Encounter: Payer: Self-pay | Admitting: Family Medicine

## 2019-03-31 DIAGNOSIS — I4891 Unspecified atrial fibrillation: Secondary | ICD-10-CM | POA: Insufficient documentation

## 2019-03-31 DIAGNOSIS — Z7901 Long term (current) use of anticoagulants: Secondary | ICD-10-CM | POA: Insufficient documentation

## 2019-03-31 DIAGNOSIS — G4701 Insomnia due to medical condition: Secondary | ICD-10-CM | POA: Insufficient documentation

## 2019-03-31 HISTORY — DX: Unspecified atrial fibrillation: I48.91

## 2019-03-31 MED ORDER — RIVAROXABAN 20 MG PO TABS: 20.0000 mg | ORAL_TABLET | Freq: Every day | ORAL | 5 refills | Status: DC

## 2019-03-31 MED ORDER — FLUTICASONE-SALMETEROL 250-50 MCG/DOSE IN AEPB: 1.0000 | INHALATION_SPRAY | Freq: Two times a day (BID) | RESPIRATORY_TRACT | 3 refills | Status: AC

## 2019-03-31 NOTE — Assessment & Plan Note (Signed)
Continue Remeron at bedtime

## 2019-03-31 NOTE — Assessment & Plan Note (Signed)
Review of d/ c summary from recent hospitalization, in March , 2020, chronic anticoagulation is recommended , will continue daily xarelto, script sent in

## 2019-03-31 NOTE — Assessment & Plan Note (Signed)
Severe , requires daily prednisone , supplemental oxygen and regular neb treatments, chronoic dyspnea and poor exercise tolerance , however reports being stable at this time

## 2019-03-31 NOTE — Telephone Encounter (Signed)
Pls call pt and / or wife Kyle Mosley, explain that I sent an rx for adviar to mail order for him as he needs this all the time, also I sent in [place] the blood thinner that was started during his March hospitalization because of heart rate , this has been sent locally ??/ concerns , pls send a message

## 2019-03-31 NOTE — Assessment & Plan Note (Signed)
Maintained on supplemental oxygen 24 hr/ day

## 2019-03-31 NOTE — Assessment & Plan Note (Signed)
Hyperlipidemia:Low fat diet discussed and encouraged.   Lipid Panel  Lab Results  Component Value Date   CHOL 127 09/22/2017   HDL 86 09/22/2017   LDLCALC 30 09/22/2017   TRIG 38 09/22/2017   CHOLHDL 1.5 09/22/2017     Updated lab needed at/ before next visit.

## 2019-03-31 NOTE — Assessment & Plan Note (Signed)
Maintained on supplemental oxygen and diuretic

## 2019-04-02 NOTE — Telephone Encounter (Signed)
Parke Simmers aware

## 2019-04-02 NOTE — Telephone Encounter (Signed)
Called pt and left message to call back.

## 2019-04-03 DIAGNOSIS — J449 Chronic obstructive pulmonary disease, unspecified: Secondary | ICD-10-CM | POA: Diagnosis not present

## 2019-04-03 DIAGNOSIS — E785 Hyperlipidemia, unspecified: Secondary | ICD-10-CM | POA: Diagnosis not present

## 2019-04-05 ENCOUNTER — Other Ambulatory Visit: Payer: Self-pay

## 2019-04-05 ENCOUNTER — Telehealth: Payer: Self-pay | Admitting: Family Medicine

## 2019-04-05 ENCOUNTER — Encounter: Payer: Self-pay | Admitting: Family Medicine

## 2019-04-05 ENCOUNTER — Ambulatory Visit (INDEPENDENT_AMBULATORY_CARE_PROVIDER_SITE_OTHER): Payer: Medicare HMO | Admitting: Family Medicine

## 2019-04-05 VITALS — Ht 72.0 in | Wt 160.0 lb

## 2019-04-05 DIAGNOSIS — Z0000 Encounter for general adult medical examination without abnormal findings: Secondary | ICD-10-CM | POA: Diagnosis not present

## 2019-04-05 MED ORDER — RIVAROXABAN 20 MG PO TABS: 20.0000 mg | ORAL_TABLET | Freq: Every day | ORAL | 1 refills | Status: AC

## 2019-04-05 NOTE — Telephone Encounter (Signed)
Xarelto sent to [facility]

## 2019-04-05 NOTE — Telephone Encounter (Signed)
Patient aware medication sent to [facility]

## 2019-04-05 NOTE — Patient Instructions (Addendum)
Kyle Mosley , Thank you for taking time to come for your Medicare Wellness Visit. I appreciate your ongoing commitment to your health goals. Please review the following plan we discussed and let me know if I can assist you in the future.   Please continue to practice social distancing to keep yourself, your family, and our community safe.  Screening recommendations/referrals: Colonoscopy: Due per last report- Discuss with Dr. Moshe Cipro Recommended yearly ophthalmology/optometry visit for glaucoma screening and checkup Recommended yearly dental visit for hygiene and checkup  Vaccinations: Influenza vaccine: October 2020 Pneumococcal vaccine: Completed Tdap vaccine: Due- discuss with Dr. Moshe Cipro Shingles vaccine: Discuss updating to Shingrix    Advanced directives: Discussed in the office at next visit.  Can adjust or change anything if needed.  Conditions/risks identified: Fall  Next appointment: September 21 at 1: 20 p.m. is your next appointment with Dr. Moshe Cipro  Preventive Care 82 Years and Older, Male Preventive care refers to lifestyle choices and visits with your health care provider that can promote health and wellness. What does preventive care include?  A yearly physical exam. This is also called an annual well check.  Dental exams once or twice a year.  Routine eye exams. Ask your health care provider how often you should have your eyes checked.  Personal lifestyle choices, including:  Daily care of your teeth and gums.  Regular physical activity.  Eating a healthy diet.  Avoiding tobacco and drug use.  Limiting alcohol use.  Practicing safe sex.  Taking low doses of aspirin every day.  Taking vitamin and mineral supplements as recommended by your health care provider. What happens during an annual well check? The services and screenings done by your health care provider during your annual well check will depend on your age, overall health, lifestyle risk  factors, and family history of disease. Counseling  Your health care provider may ask you questions about your:  Alcohol use.  Tobacco use.  Drug use.  Emotional well-being.  Home and relationship well-being.  Sexual activity.  Eating habits.  History of falls.  Memory and ability to understand (cognition).  Work and work Statistician. Screening  You may have the following tests or measurements:  Height, weight, and BMI.  Blood pressure.  Lipid and cholesterol levels. These may be checked every 5 years, or more frequently if you are over 82 years old.  Skin check.  Lung cancer screening. You may have this screening every year starting at age 82 if you have a 30-pack-year history of smoking and currently smoke or have quit within the past 15 years.  Fecal occult blood test (FOBT) of the stool. You may have this test every year starting at age 82.  Flexible sigmoidoscopy or colonoscopy. You may have a sigmoidoscopy every 5 years or a colonoscopy every 10 years starting at age 82.  Prostate cancer screening. Recommendations will vary depending on your family history and other risks.  Hepatitis C blood test.  Hepatitis B blood test.  Sexually transmitted disease (STD) testing.  Diabetes screening. This is done by checking your blood sugar (glucose) after you have not eaten for a while (fasting). You may have this done every 1-3 years.  Abdominal aortic aneurysm (AAA) screening. You may need this if you are a current or former smoker.  Osteoporosis. You may be screened starting at age 70 if you are at high risk. Talk with your health care provider about your test results, treatment options, and if necessary, the need for more  tests. Vaccines  Your health care provider may recommend certain vaccines, such as:  Influenza vaccine. This is recommended every year.  Tetanus, diphtheria, and acellular pertussis (Tdap, Td) vaccine. You may need a Td booster every 10  years.  Zoster vaccine. You may need this after age 82.  Pneumococcal 13-valent conjugate (PCV13) vaccine. One dose is recommended after age 82.  Pneumococcal polysaccharide (PPSV23) vaccine. One dose is recommended after age 22. Talk to your health care provider about which screenings and vaccines you need and how often you need them. This information is not intended to replace advice given to you by your health care provider. Make sure you discuss any questions you have with your health care provider. Document Released: 12/05/2015 Document Revised: 07/28/2016 Document Reviewed: 09/09/2015 Elsevier Interactive Patient Education  2017 [place] Prevention in the Home Falls can cause injuries. They can happen to people of all ages. There are many things you can do to make your home safe and to help prevent falls. What can I do on the outside of my home?  Regularly fix the edges of walkways and driveways and fix any cracks.  Remove anything that might make you trip as you walk through a door, such as a raised step or threshold.  Trim any bushes or trees on the path to your home.  Use bright outdoor lighting.  Clear any walking paths of anything that might make someone trip, such as rocks or tools.  Regularly check to see if handrails are loose or broken. Make sure that both sides of any steps have handrails.  Any raised decks and porches should have guardrails on the edges.  Have any leaves, snow, or ice cleared regularly.  Use sand or salt on walking paths during winter.  Clean up any spills in your garage right away. This includes oil or grease spills. What can I do in the bathroom?  Use night lights.  Install grab bars by the toilet and in the tub and shower. Do not use towel bars as grab bars.  Use non-skid mats or decals in the tub or shower.  If you need to sit down in the shower, use a plastic, non-slip stool.  Keep the floor dry. Clean up any water that  spills on the floor as soon as it happens.  Remove soap buildup in the tub or shower regularly.  Attach bath mats securely with double-sided non-slip rug tape.  Do not have throw rugs and other things on the floor that can make you trip. What can I do in the bedroom?  Use night lights.  Make sure that you have a light by your bed that is easy to reach.  Do not use any sheets or blankets that are too big for your bed. They should not hang down onto the floor.  Have a firm chair that has side arms. You can use this for support while you get dressed.  Do not have throw rugs and other things on the floor that can make you trip. What can I do in the kitchen?  Clean up any spills right away.  Avoid walking on wet floors.  Keep items that you use a lot in easy-to-reach places.  If you need to reach something above you, use a strong step stool that has a grab bar.  Keep electrical cords out of the way.  Do not use floor polish or wax that makes floors slippery. If you must use wax, use non-skid floor  wax.  Do not have throw rugs and other things on the floor that can make you trip. What can I do with my stairs?  Do not leave any items on the stairs.  Make sure that there are handrails on both sides of the stairs and use them. Fix handrails that are broken or loose. Make sure that handrails are as long as the stairways.  Check any carpeting to make sure that it is firmly attached to the stairs. Fix any carpet that is loose or worn.  Avoid having throw rugs at the top or bottom of the stairs. If you do have throw rugs, attach them to the floor with carpet tape.  Make sure that you have a light switch at the top of the stairs and the bottom of the stairs. If you do not have them, ask someone to add them for you. What else can I do to help prevent falls?  Wear shoes that:  Do not have high heels.  Have rubber bottoms.  Are comfortable and fit you well.  Are closed at the  toe. Do not wear sandals.  If you use a stepladder:  Make sure that it is fully opened. Do not climb a closed stepladder.  Make sure that both sides of the stepladder are locked into place.  Ask someone to hold it for you, if possible.  Clearly mark and make sure that you can see:  Any grab bars or handrails.  First and last steps.  Where the edge of each step is.  Use tools that help you move around (mobility aids) if they are needed. These include:  Canes.  Walkers.  Scooters.  Crutches.  Turn on the lights when you go into a dark area. Replace any light bulbs as soon as they burn out.  Set up your furniture so you have a clear path. Avoid moving your furniture around.  If any of your floors are uneven, fix them.  If there are any pets around you, be aware of where they are.  Review your medicines with your doctor. Some medicines can make you feel dizzy. This can increase your chance of falling. Ask your doctor what other things that you can do to help prevent falls. This information is not intended to replace advice given to you by your health care provider. Make sure you discuss any questions you have with your health care provider. Document Released: 09/04/2009 Document Revised: 04/15/2016 Document Reviewed: 12/13/2014 Elsevier Interactive Patient Education  2017 [employer].

## 2019-04-05 NOTE — Progress Notes (Signed)
Subjective:   Kyle Mosley is a 84 y.o. male who presents for Medicare Annual/Subsequent preventive examination.  Location of Patient: Home Location of Provider: Telehealth Consent was obtain for visit to be over via telehealth. I verified that I am speaking with the correct person using two identifiers.   Review of Systems:    Cardiac Risk Factors include: advanced age (>73men, >41 women);male gender     Objective:    Vitals: Ht 6' (1.829 m)   Wt 160 lb (72.6 kg)   BMI 21.70 kg/m   Body mass index is 21.7 kg/m.  Advanced Directives 02/18/2019 02/18/2019 06/13/2018 06/13/2018 11/28/2017 11/28/2017 11/28/2017  Does Patient Have a Medical Advance Directive? No No No No Yes No No  Type of Advance Directive - - - - Living will - -  Does patient want to make changes to medical advance directive? - - - - No - Patient declined - -  Copy of [place] in [place]  Would patient like information on creating a medical advance directive? No - Patient declined No - Patient declined No - Patient declined No - Patient declined No - Patient declined - No - Patient declined    Tobacco Social History   Tobacco Use  Smoking Status Former Smoker  . Packs/day: 1.00  . Years: 25.00  . Pack years: 25.00  . Last attempt to quit: 11/24/1996  . Years since quitting: 22.3  Smokeless Tobacco Never Used     Counseling given: Yes   Clinical Intake:  Pre-visit preparation completed: Yes  Pain : No/denies pain Pain Score: 0-No pain     Nutritional Status: BMI of 19-24  Normal Nutritional Risks: None Diabetes: No  How often do you need to have someone help you when you read instructions, pamphlets, or other written materials from your doctor or pharmacy?: 3 - Sometimes What is the last grade level you completed in school?: 12  Interpreter Needed?: No     Past Medical History:  Diagnosis Date  . Asthma   . Colon polyps   . COPD (chronic obstructive pulmonary  disease) ([place])    3L home O2  . Diverticulosis   . Enlarged prostate    with elevated PSA  . Hyperlipidemia   . Hypertension   . Prediabetes 2011  . Pulmonary hypertension ([place]) 2012   Past Surgical History:  Procedure Laterality Date  . COLONOSCOPY  03/21/2007   Dr. Jonny Ruiz diverticula, normal rectum  . COLONOSCOPY  05/04/2012   Procedure: COLONOSCOPY;  Surgeon: Daneil Dolin, MD;  Location: [facility];  Service: Endoscopy;  Laterality: N/A;  7:30  . PROSTATE BIOPSY     Family History  Problem Relation Age of Onset  . Heart attack Father   . Alcohol abuse Mother   . Hypertension Brother   . COPD Sister    Social History   Socioeconomic History  . Marital status: Married    Spouse name: Not on file  . Number of children: 3  . Years of education: Not on file  . Highest education level: Not on file  Occupational History  . Occupation: retired   Scientific laboratory technician  . Financial resource strain: Not hard at all  . Food insecurity:    Worry: Never true    Inability: Never true  . Transportation needs:    Medical: No    Non-medical: No  Tobacco Use  . Smoking status: Former Smoker  Packs/day: 1.00    Years: 25.00    Pack years: 25.00    Last attempt to quit: 11/24/1996    Years since quitting: 22.3  . Smokeless tobacco: Never Used  Substance and Sexual Activity  . Alcohol use: No  . Drug use: No  . Sexual activity: Not Currently  Lifestyle  . Physical activity:    Days per week: 1 day    Minutes per session: 10 min  . Stress: Only a little  Relationships  . Social connections:    Talks on phone: Three times a week    Gets together: Three times a week    Attends religious service: More than 4 times per year    Active member of club or organization: No    Attends meetings of clubs or organizations: Never    Relationship status: Married  Other Topics Concern  . Not on file  Social History Narrative  . Not on file    Outpatient Encounter Medications  as of 04/05/2019  Medication Sig  . albuterol (PROVENTIL) (2.5 MG/3ML) 0.083% nebulizer solution INHALE THE CONTENTS OF 1 VIAL VIA NEBULIZER TWO TO THREE TIMES DAILY (Patient taking differently: Take 2.5 mg by nebulization 3 (three) times daily. )  . albuterol (VENTOLIN HFA) 108 (90 Base) MCG/ACT inhaler Inhale 2 puffs into the lungs every 6 (six) hours as needed for wheezing or shortness of breath.  . feeding supplement, ENSURE ENLIVE, (ENSURE ENLIVE) LIQD Take 237 mLs by mouth 2 (two) times daily between meals.  . Fluticasone-Salmeterol (ADVAIR DISKUS) 250-50 MCG/DOSE AEPB Inhale 1 puff into the lungs 2 (two) times daily.  . furosemide (LASIX) 20 MG tablet Take 1 tablet (20 mg total) by mouth daily.  Marland Kitchen ipratropium (ATROVENT) 0.02 % nebulizer solution USE ONE VIAL IN NEBULIZER TWO TO THREE TIMES A DAY. (Patient taking differently: Take 0.5 mg by nebulization 3 (three) times daily. )  . lovastatin (MEVACOR) 10 MG tablet TAKE 1 TABLET AT BEDTIME  . mirtazapine (REMERON) 7.5 MG tablet TAKE 1 TABLET AT BEDTIME (Patient taking differently: Take 7.5 mg by mouth at bedtime. )  . OXYGEN Inhale 3 L into the lungs continuous.  . pantoprazole (PROTONIX) 20 MG tablet Take 1 tablet (20 mg total) by mouth daily.  . potassium chloride (K-DUR) 10 MEQ tablet Take 1 tablet (10 mEq total) by mouth daily.  . predniSONE (DELTASONE) 5 MG tablet TAKE 1 TABLET (5 MG TOTAL) BY MOUTH DAILY. (Patient taking differently: Take 5 mg by mouth daily with breakfast. )  . rivaroxaban (XARELTO) 20 MG TABS tablet Take 1 tablet (20 mg total) by mouth daily with supper.  . tamsulosin (FLOMAX) 0.4 MG CAPS capsule TAKE 1 CAPSULE  DAILY AFTER SUPPER.  Marland Kitchen UNABLE TO FIND Nebulizer machine and tubing  DX J44.1  . [DISCONTINUED] rivaroxaban (XARELTO) 20 MG TABS tablet Take 1 tablet (20 mg total) by mouth daily with supper.   No facility-administered encounter medications on file as of 04/05/2019.     Activities of Daily Living In your  present state of health, do you have any difficulty performing the following activities: 04/05/2019 02/18/2019  Hearing? Y N  Vision? Y N  Difficulty concentrating or making decisions? Y N  Walking or climbing stairs? Y N  Dressing or bathing? Y N  Doing errands, shopping? Y N  Preparing Food and eating ? Y -  Using the Toilet? N -  In the past six months, have you accidently leaked urine? N -  Do you  have problems with loss of bowel control? N -  Managing your Medications? Y -  Managing your Finances? N -  Housekeeping or managing your Housekeeping? Y -  Some recent data might be hidden    Patient Care Team: Fayrene Helper, MD as PCP - General Rourk, Cristopher Estimable, MD (Gastroenterology) Herminio Commons, MD as Attending Physician (Cardiology) Madelin Headings, DO (Optometry) Irine Seal, MD as Attending Physician (Urology) Dory Horn, MD as Rounding Team (Internal Medicine)   Assessment:   This is a routine wellness examination for Kyle Mosley.  Exercise Activities and Dietary recommendations Current Exercise Habits: The patient does not participate in regular exercise at present, Exercise limited by: orthopedic condition(s)  Goals    . Increase water intake     Patient would like to increase his water intake to 20 ounces a day, or 5 small bottles of water.       Fall Risk Fall Risk  04/05/2019 03/29/2019 03/08/2019 12/11/2018 09/05/2018  Falls in the past year? 1 0 0 0 No  Number falls in past yr: 1 0 0 0 -  Comment - - - - -  Injury with Fall? 0 0 0 0 -  Risk for fall due to : - - - - -  Follow up - - - - -   Is the patient's home free of loose throw rugs in walkways, pet beds, electrical cords, etc?   yes      Grab bars in the bathroom? yes      Handrails on the stairs?   yes      Adequate lighting?   yes  Timed Get Up and Go Performed:  Telemedicine unable to assess  Depression Screen PHQ 2/9 Scores 04/05/2019 12/11/2018 09/05/2018 06/22/2018  PHQ - 2 Score 1 0 0 3   PHQ- 9 Score - - - 11    Cognitive Function     6CIT Screen 04/05/2019 11/28/2017 11/24/2016  What Year? 4 points 0 points 0 points  What month? 0 points 0 points 0 points  What time? 0 points 3 points 0 points  Count back from 20 4 points 4 points 0 points  Months in reverse 4 points 4 points 2 points  Repeat phrase 0 points 2 points 0 points  Total Score 12 13 2     Immunization History  Administered Date(s) Administered  . H1N1 09/17/2008  . Influenza Split 08/13/2014  . Influenza Whole 09/06/2007, 09/04/2008, 07/31/2009, 08/07/2010  . Influenza, High Dose Seasonal PF 09/05/2018  . Influenza,inj,Quad PF,6+ Mos 09/10/2015, 07/13/2016, 08/29/2017  . Pneumococcal Conjugate-13 05/27/2014  . Pneumococcal Polysaccharide-23 04/05/2004  . Td 07/20/2005  . Zoster 02/15/2012    Qualifies for Shingles Vaccine?  complete Screening Tests Health Maintenance  Topic Date Due  . TETANUS/TDAP  07/21/2015  . COLONOSCOPY  05/04/2017  . INFLUENZA VACCINE  06/23/2019  . PNA vac Low Risk Adult  Completed   Cancer Screenings: Lung: Low Dose CT Chest recommended if Age 82-80 years, 30 pack-year currently smoking OR have quit w/in 15years. Patient does not qualify. Colorectal:  Aged out-unless needed.  Additional Screenings:  Hepatitis C Screening: Add on next lab set.  Discussed with Dr. Moshe Cipro if needed.      Plan:       1. Encounter for Medicare annual wellness exam  I have personally reviewed and noted the following in the patient's chart:   . Medical and social history . Use of alcohol, tobacco or illicit drugs  . Current medications  and supplements . Functional ability and status . Nutritional status . Physical activity . Advanced directives . List of other physicians . Hospitalizations, surgeries, and ER visits in previous 12 months . Vitals . Screenings to include cognitive, depression, and falls . Referrals and appointments  In addition, I have reviewed and discussed  with patient certain preventive protocols, quality metrics, and best practice recommendations. A written personalized care plan for preventive services as well as general preventive health recommendations were provided to patient.   I provided 20  minutes of non-face-to-face time during this encounter.   Perlie Mayo, NP  04/05/2019

## 2019-04-05 NOTE — Telephone Encounter (Signed)
Please send xarelto to [facility] not [facility]

## 2019-04-09 ENCOUNTER — Other Ambulatory Visit: Payer: Self-pay | Admitting: Family Medicine

## 2019-04-09 MED ORDER — PREDNISONE 5 MG PO TABS: 5.0000 mg | ORAL_TABLET | Freq: Every day | ORAL | 1 refills | Status: AC

## 2019-04-09 NOTE — Progress Notes (Signed)
pred 5  

## 2019-05-01 ENCOUNTER — Other Ambulatory Visit: Payer: Self-pay | Admitting: Family Medicine

## 2019-05-01 DIAGNOSIS — I272 Pulmonary hypertension, unspecified: Secondary | ICD-10-CM

## 2019-05-04 DIAGNOSIS — J449 Chronic obstructive pulmonary disease, unspecified: Secondary | ICD-10-CM | POA: Diagnosis not present

## 2019-05-04 DIAGNOSIS — E785 Hyperlipidemia, unspecified: Secondary | ICD-10-CM | POA: Diagnosis not present

## 2019-05-09 ENCOUNTER — Ambulatory Visit: Payer: Medicare HMO | Admitting: Family Medicine

## 2019-05-27 ENCOUNTER — Telehealth: Payer: Self-pay | Admitting: Family Medicine

## 2019-05-27 DIAGNOSIS — R040 Epistaxis: Secondary | ICD-10-CM

## 2019-05-27 NOTE — Telephone Encounter (Signed)
Having nose bleeds and may be do to new med.  Please call patient on Monday.

## 2019-05-28 NOTE — Telephone Encounter (Signed)
Needs eval by ENT for nose bleeds asap, pls refer , I will sign, Dr Benjamine Mola in [place] him to stay on xarelto to reduce stroke risk, advise if excess bleed go to ED

## 2019-05-28 NOTE — Telephone Encounter (Signed)
Patient is having nose bleeds. They arent very often but when they do happen they are pretty heavy at the beginning but they do taper off and stop. May have had a few prior to the blood thinner, but seem to have gotten heavier seen starting the blood thinner.

## 2019-05-29 ENCOUNTER — Telehealth: Payer: Self-pay

## 2019-05-29 NOTE — Telephone Encounter (Signed)
Patient refusing to go to ENT. He stated he wants to "drop on the ASA first then if they keep happening he will go to the ENT".

## 2019-05-29 NOTE — Telephone Encounter (Signed)
Thanks

## 2019-05-29 NOTE — Telephone Encounter (Signed)
He I not on aspirin, but on xarelto for his a fib, the plavix is to reduce his stroke risk. Once he understands this , then it is still his choice to make an informed decision.,  He may have a blood vessel in his nostril that ENT can treat in the office so nose bleeding reduces, and for this reason it is recommended he see ENT

## 2019-05-29 NOTE — Telephone Encounter (Signed)
Patient agreed to referral. Referral placed for [facility].

## 2019-05-31 NOTE — Telephone Encounter (Signed)
error 

## 2019-06-03 DIAGNOSIS — J449 Chronic obstructive pulmonary disease, unspecified: Secondary | ICD-10-CM | POA: Diagnosis not present

## 2019-06-03 DIAGNOSIS — E785 Hyperlipidemia, unspecified: Secondary | ICD-10-CM | POA: Diagnosis not present

## 2019-06-22 ENCOUNTER — Other Ambulatory Visit: Payer: Self-pay | Admitting: Family Medicine

## 2019-06-22 DIAGNOSIS — F32A Depression, unspecified: Secondary | ICD-10-CM

## 2019-06-22 DIAGNOSIS — F329 Major depressive disorder, single episode, unspecified: Secondary | ICD-10-CM

## 2019-06-30 ENCOUNTER — Emergency Department (HOSPITAL_COMMUNITY): Payer: Medicare HMO

## 2019-06-30 ENCOUNTER — Inpatient Hospital Stay (HOSPITAL_COMMUNITY)
Admission: EM | Admit: 2019-06-30 | Discharge: 2019-07-03 | DRG: 291 | Disposition: A | Discharge status: Home Health | Payer: Medicare HMO | Attending: Family Medicine | Admitting: Family Medicine

## 2019-06-30 ENCOUNTER — Other Ambulatory Visit: Payer: Self-pay

## 2019-06-30 ENCOUNTER — Encounter (HOSPITAL_COMMUNITY): Payer: Self-pay | Admitting: Emergency Medicine

## 2019-06-30 DIAGNOSIS — J438 Other emphysema: Secondary | ICD-10-CM | POA: Diagnosis not present

## 2019-06-30 DIAGNOSIS — R531 Weakness: Secondary | ICD-10-CM

## 2019-06-30 DIAGNOSIS — H547 Unspecified visual loss: Secondary | ICD-10-CM | POA: Diagnosis present

## 2019-06-30 DIAGNOSIS — R062 Wheezing: Secondary | ICD-10-CM | POA: Diagnosis not present

## 2019-06-30 DIAGNOSIS — R7303 Prediabetes: Secondary | ICD-10-CM | POA: Diagnosis present

## 2019-06-30 DIAGNOSIS — D638 Anemia in other chronic diseases classified elsewhere: Secondary | ICD-10-CM | POA: Diagnosis present

## 2019-06-30 DIAGNOSIS — I719 Aortic aneurysm of unspecified site, without rupture: Secondary | ICD-10-CM | POA: Diagnosis present

## 2019-06-30 DIAGNOSIS — Z7901 Long term (current) use of anticoagulants: Secondary | ICD-10-CM

## 2019-06-30 DIAGNOSIS — Z20828 Contact with and (suspected) exposure to other viral communicable diseases: Secondary | ICD-10-CM | POA: Diagnosis present

## 2019-06-30 DIAGNOSIS — I4892 Unspecified atrial flutter: Secondary | ICD-10-CM | POA: Diagnosis not present

## 2019-06-30 DIAGNOSIS — I4891 Unspecified atrial fibrillation: Secondary | ICD-10-CM | POA: Diagnosis not present

## 2019-06-30 DIAGNOSIS — I499 Cardiac arrhythmia, unspecified: Secondary | ICD-10-CM | POA: Diagnosis not present

## 2019-06-30 DIAGNOSIS — R7989 Other specified abnormal findings of blood chemistry: Secondary | ICD-10-CM

## 2019-06-30 DIAGNOSIS — J449 Chronic obstructive pulmonary disease, unspecified: Secondary | ICD-10-CM | POA: Diagnosis present

## 2019-06-30 DIAGNOSIS — Z79899 Other long term (current) drug therapy: Secondary | ICD-10-CM

## 2019-06-30 DIAGNOSIS — I482 Chronic atrial fibrillation, unspecified: Secondary | ICD-10-CM | POA: Diagnosis present

## 2019-06-30 DIAGNOSIS — I272 Pulmonary hypertension, unspecified: Secondary | ICD-10-CM | POA: Diagnosis not present

## 2019-06-30 DIAGNOSIS — K5790 Diverticulosis of intestine, part unspecified, without perforation or abscess without bleeding: Secondary | ICD-10-CM | POA: Diagnosis present

## 2019-06-30 DIAGNOSIS — Z87891 Personal history of nicotine dependence: Secondary | ICD-10-CM

## 2019-06-30 DIAGNOSIS — I110 Hypertensive heart disease with heart failure: Secondary | ICD-10-CM | POA: Diagnosis not present

## 2019-06-30 DIAGNOSIS — G4701 Insomnia due to medical condition: Secondary | ICD-10-CM | POA: Diagnosis not present

## 2019-06-30 DIAGNOSIS — R0902 Hypoxemia: Secondary | ICD-10-CM

## 2019-06-30 DIAGNOSIS — J440 Chronic obstructive pulmonary disease with acute lower respiratory infection: Secondary | ICD-10-CM | POA: Diagnosis present

## 2019-06-30 DIAGNOSIS — Z825 Family history of asthma and other chronic lower respiratory diseases: Secondary | ICD-10-CM | POA: Diagnosis not present

## 2019-06-30 DIAGNOSIS — Z8249 Family history of ischemic heart disease and other diseases of the circulatory system: Secondary | ICD-10-CM

## 2019-06-30 DIAGNOSIS — Z209 Contact with and (suspected) exposure to unspecified communicable disease: Secondary | ICD-10-CM | POA: Diagnosis not present

## 2019-06-30 DIAGNOSIS — Z7951 Long term (current) use of inhaled steroids: Secondary | ICD-10-CM

## 2019-06-30 DIAGNOSIS — I361 Nonrheumatic tricuspid (valve) insufficiency: Secondary | ICD-10-CM | POA: Diagnosis not present

## 2019-06-30 DIAGNOSIS — R600 Localized edema: Secondary | ICD-10-CM | POA: Diagnosis not present

## 2019-06-30 DIAGNOSIS — Z9981 Dependence on supplemental oxygen: Secondary | ICD-10-CM | POA: Diagnosis not present

## 2019-06-30 DIAGNOSIS — I714 Abdominal aortic aneurysm, without rupture: Secondary | ICD-10-CM | POA: Diagnosis not present

## 2019-06-30 DIAGNOSIS — R4182 Altered mental status, unspecified: Secondary | ICD-10-CM | POA: Diagnosis not present

## 2019-06-30 DIAGNOSIS — J189 Pneumonia, unspecified organism: Secondary | ICD-10-CM | POA: Diagnosis not present

## 2019-06-30 DIAGNOSIS — R000 Tachycardia, unspecified: Secondary | ICD-10-CM | POA: Diagnosis not present

## 2019-06-30 DIAGNOSIS — E785 Hyperlipidemia, unspecified: Secondary | ICD-10-CM | POA: Diagnosis present

## 2019-06-30 DIAGNOSIS — N400 Enlarged prostate without lower urinary tract symptoms: Secondary | ICD-10-CM | POA: Diagnosis present

## 2019-06-30 DIAGNOSIS — J441 Chronic obstructive pulmonary disease with (acute) exacerbation: Secondary | ICD-10-CM | POA: Diagnosis present

## 2019-06-30 DIAGNOSIS — Z7952 Long term (current) use of systemic steroids: Secondary | ICD-10-CM | POA: Diagnosis not present

## 2019-06-30 DIAGNOSIS — R0602 Shortness of breath: Secondary | ICD-10-CM | POA: Diagnosis not present

## 2019-06-30 DIAGNOSIS — I5033 Acute on chronic diastolic (congestive) heart failure: Secondary | ICD-10-CM | POA: Diagnosis not present

## 2019-06-30 LAB — CBC WITH DIFFERENTIAL/PLATELET
Abs Immature Granulocytes: 0.02 10*3/uL (ref 0.00–0.07)
Basophils Absolute: 0 10*3/uL (ref 0.0–0.1)
Basophils Relative: 0 %
Eosinophils Absolute: 0.1 10*3/uL (ref 0.0–0.5)
Eosinophils Relative: 1 %
HCT: 41.6 % (ref 39.0–52.0)
Hemoglobin: 11.7 g/dL — ABNORMAL LOW (ref 13.0–17.0)
Immature Granulocytes: 0 %
Lymphocytes Relative: 8 %
Lymphs Abs: 0.5 10*3/uL — ABNORMAL LOW (ref 0.7–4.0)
MCH: 29.1 pg (ref 26.0–34.0)
MCHC: 28.1 g/dL — ABNORMAL LOW (ref 30.0–36.0)
MCV: 103.5 fL — ABNORMAL HIGH (ref 80.0–100.0)
Monocytes Absolute: 0.6 10*3/uL (ref 0.1–1.0)
Monocytes Relative: 12 %
Neutro Abs: 4.3 10*3/uL (ref 1.7–7.7)
Neutrophils Relative %: 79 %
Platelets: 312 10*3/uL (ref 150–400)
RBC: 4.02 MIL/uL — ABNORMAL LOW (ref 4.22–5.81)
RDW: 14.3 % (ref 11.5–15.5)
WBC: 5.4 10*3/uL (ref 4.0–10.5)
nRBC: 0 % (ref 0.0–0.2)

## 2019-06-30 LAB — LACTIC ACID, PLASMA
Lactic Acid, Venous: 0.6 mmol/L (ref 0.5–1.9)
Lactic Acid, Venous: 0.7 mmol/L (ref 0.5–1.9)

## 2019-06-30 LAB — SARS CORONAVIRUS 2 BY RT PCR (HOSPITAL ORDER, PERFORMED IN ~~LOC~~ HOSPITAL LAB): SARS Coronavirus 2: NEGATIVE

## 2019-06-30 LAB — LIPID PANEL
Cholesterol: 120 mg/dL (ref 0–200)
HDL: 60 mg/dL (ref >40)
LDL Cholesterol: 53 mg/dL (ref 0–99)
Total CHOL/HDL Ratio: 2 RATIO
Triglycerides: 33 mg/dL (ref <150)
VLDL: 7 mg/dL (ref 0–40)

## 2019-06-30 LAB — BRAIN NATRIURETIC PEPTIDE: B Natriuretic Peptide: 380 pg/mL — ABNORMAL HIGH (ref 0.0–100.0)

## 2019-06-30 LAB — URINALYSIS, ROUTINE W REFLEX MICROSCOPIC
Bilirubin Urine: NEGATIVE
Glucose, UA: NEGATIVE mg/dL
Hgb urine dipstick: NEGATIVE
Ketones, ur: NEGATIVE mg/dL
Leukocytes,Ua: NEGATIVE
Nitrite: NEGATIVE
Protein, ur: NEGATIVE mg/dL
Specific Gravity, Urine: 1.024 (ref 1.005–1.030)
pH: 5 (ref 5.0–8.0)

## 2019-06-30 LAB — BASIC METABOLIC PANEL
Anion gap: 6 (ref 5–15)
BUN: 26 mg/dL — ABNORMAL HIGH (ref 8–23)
CO2: 40 mmol/L — ABNORMAL HIGH (ref 22–32)
Calcium: 8.6 mg/dL — ABNORMAL LOW (ref 8.9–10.3)
Chloride: 99 mmol/L (ref 98–111)
Creatinine, Ser: 0.62 mg/dL (ref 0.61–1.24)
GFR calc Af Amer: >60 mL/min (ref >60)
GFR calc non Af Amer: >60 mL/min (ref >60)
Glucose, Bld: 106 mg/dL — ABNORMAL HIGH (ref 70–99)
Potassium: 4.9 mmol/L (ref 3.5–5.1)
Sodium: 145 mmol/L (ref 135–145)

## 2019-06-30 LAB — TSH: TSH: 4.448 u[IU]/mL (ref 0.350–4.500)

## 2019-06-30 LAB — TROPONIN I (HIGH SENSITIVITY)
Troponin I (High Sensitivity): 6 ng/L (ref <18)
Troponin I (High Sensitivity): 8 ng/L (ref <18)

## 2019-06-30 LAB — HEMOGLOBIN A1C
Hgb A1c MFr Bld: 5.5 % (ref 4.8–5.6)
Mean Plasma Glucose: 111.15 mg/dL

## 2019-06-30 MED ORDER — SODIUM CHLORIDE 0.9 % IV SOLN
250.0000 mL | INTRAVENOUS | Status: DC | PRN
  Administered 2019-07-02: 250 mL via INTRAVENOUS

## 2019-06-30 MED ORDER — DOXYCYCLINE HYCLATE 100 MG PO TABS
100.0000 mg | ORAL_TABLET | Freq: Once | ORAL | Status: AC
  Administered 2019-06-30: 13:00:00 100 mg via ORAL
  Filled 2019-06-30: qty 1

## 2019-06-30 MED ORDER — POTASSIUM CHLORIDE CRYS ER 10 MEQ PO TBCR
EXTENDED_RELEASE_TABLET | ORAL | Status: AC
  Filled 2019-06-30: qty 3

## 2019-06-30 MED ORDER — SODIUM CHLORIDE 0.9 % IV SOLN
2.0000 g | INTRAVENOUS | Status: DC
  Administered 2019-06-30 – 2019-07-03 (×4): 2 g via INTRAVENOUS
  Filled 2019-06-30 (×4): qty 20

## 2019-06-30 MED ORDER — ENSURE ENLIVE PO LIQD
237.0000 mL | Freq: Two times a day (BID) | ORAL | Status: DC
  Administered 2019-06-30 – 2019-07-03 (×6): 237 mL via ORAL
  Filled 2019-06-30 (×3): qty 237

## 2019-06-30 MED ORDER — TRAZODONE HCL 50 MG PO TABS: 50.0000 mg | ORAL_TABLET | Freq: Every evening | ORAL | Status: DC | PRN

## 2019-06-30 MED ORDER — ACETAMINOPHEN 325 MG PO TABS: 650.0000 mg | ORAL_TABLET | Freq: Four times a day (QID) | ORAL | Status: DC | PRN

## 2019-06-30 MED ORDER — DILTIAZEM LOAD VIA INFUSION
10.0000 mg | Freq: Once | INTRAVENOUS | Status: AC
  Administered 2019-06-30: 14:00:00 10 mg via INTRAVENOUS
  Filled 2019-06-30: qty 10

## 2019-06-30 MED ORDER — IPRATROPIUM-ALBUTEROL 0.5-2.5 (3) MG/3ML IN SOLN
3.0000 mL | RESPIRATORY_TRACT | Status: DC
  Administered 2019-06-30 (×3): 3 mL via RESPIRATORY_TRACT
  Filled 2019-06-30 (×3): qty 3

## 2019-06-30 MED ORDER — RIVAROXABAN 20 MG PO TABS
20.0000 mg | ORAL_TABLET | Freq: Every day | ORAL | Status: DC
  Administered 2019-06-30 – 2019-07-02 (×3): 20 mg via ORAL
  Filled 2019-06-30 (×4): qty 1

## 2019-06-30 MED ORDER — SODIUM CHLORIDE 0.9 % IV SOLN: 500.0000 mg | INTRAVENOUS | Status: DC

## 2019-06-30 MED ORDER — SODIUM CHLORIDE 0.9% FLUSH
3.0000 mL | INTRAVENOUS | Status: DC | PRN
  Administered 2019-06-30 (×2): 3 mL via INTRAVENOUS
  Filled 2019-06-30 (×2): qty 3

## 2019-06-30 MED ORDER — LORAZEPAM 2 MG/ML IJ SOLN: 0.5000 mg | INTRAMUSCULAR | Status: DC | PRN

## 2019-06-30 MED ORDER — MIRTAZAPINE 15 MG PO TABS
7.5000 mg | ORAL_TABLET | Freq: Every day | ORAL | Status: DC
  Administered 2019-06-30 – 2019-07-02 (×3): 7.5 mg via ORAL
  Filled 2019-06-30 (×3): qty 1

## 2019-06-30 MED ORDER — POTASSIUM CHLORIDE CRYS ER 20 MEQ PO TBCR
30.0000 meq | EXTENDED_RELEASE_TABLET | Freq: Every day | ORAL | Status: DC
  Administered 2019-06-30 – 2019-07-03 (×4): 30 meq via ORAL
  Filled 2019-06-30: qty 2
  Filled 2019-06-30 (×2): qty 3
  Filled 2019-06-30 (×2): qty 2

## 2019-06-30 MED ORDER — ALBUTEROL SULFATE HFA 108 (90 BASE) MCG/ACT IN AERS
8.0000 | INHALATION_SPRAY | RESPIRATORY_TRACT | Status: AC
  Administered 2019-06-30: 8 via RESPIRATORY_TRACT
  Filled 2019-06-30: qty 6.7

## 2019-06-30 MED ORDER — TAMSULOSIN HCL 0.4 MG PO CAPS
0.4000 mg | ORAL_CAPSULE | Freq: Every day | ORAL | Status: DC
  Administered 2019-06-30 – 2019-07-02 (×3): 0.4 mg via ORAL
  Filled 2019-06-30 (×3): qty 1

## 2019-06-30 MED ORDER — DOXYCYCLINE HYCLATE 100 MG PO TABS
100.0000 mg | ORAL_TABLET | Freq: Two times a day (BID) | ORAL | Status: AC
  Administered 2019-06-30 – 2019-07-03 (×6): 100 mg via ORAL
  Filled 2019-06-30 (×6): qty 1

## 2019-06-30 MED ORDER — PRAVASTATIN SODIUM 10 MG PO TABS
20.0000 mg | ORAL_TABLET | Freq: Every day | ORAL | Status: DC
  Administered 2019-06-30 – 2019-07-02 (×3): 20 mg via ORAL
  Filled 2019-06-30 (×2): qty 1
  Filled 2019-06-30 (×2): qty 2

## 2019-06-30 MED ORDER — ONDANSETRON HCL 4 MG PO TABS: 4.0000 mg | ORAL_TABLET | Freq: Four times a day (QID) | ORAL | Status: DC | PRN

## 2019-06-30 MED ORDER — ONDANSETRON HCL 4 MG/2ML IJ SOLN: 4.0000 mg | Freq: Four times a day (QID) | INTRAMUSCULAR | Status: DC | PRN

## 2019-06-30 MED ORDER — DILTIAZEM HCL 100 MG IV SOLR
5.0000 mg/h | INTRAVENOUS | Status: DC
  Administered 2019-06-30 – 2019-07-01 (×2): 5 mg/h via INTRAVENOUS
  Filled 2019-06-30 (×2): qty 100

## 2019-06-30 MED ORDER — FUROSEMIDE 10 MG/ML IJ SOLN
20.0000 mg | Freq: Two times a day (BID) | INTRAMUSCULAR | Status: DC
  Administered 2019-06-30 – 2019-07-01 (×2): 20 mg via INTRAVENOUS
  Filled 2019-06-30 (×2): qty 2

## 2019-06-30 MED ORDER — PANTOPRAZOLE SODIUM 40 MG IV SOLR
40.0000 mg | INTRAVENOUS | Status: DC
  Administered 2019-06-30 – 2019-07-02 (×3): 40 mg via INTRAVENOUS
  Filled 2019-06-30 (×3): qty 40

## 2019-06-30 MED ORDER — GUAIFENESIN ER 600 MG PO TB12
1200.0000 mg | ORAL_TABLET | Freq: Two times a day (BID) | ORAL | Status: DC
  Administered 2019-06-30 – 2019-07-03 (×7): 1200 mg via ORAL
  Filled 2019-06-30 (×7): qty 2

## 2019-06-30 MED ORDER — BISACODYL 5 MG PO TBEC: 5.0000 mg | DELAYED_RELEASE_TABLET | Freq: Every day | ORAL | Status: DC | PRN

## 2019-06-30 MED ORDER — ACETAMINOPHEN 650 MG RE SUPP: 650.0000 mg | Freq: Four times a day (QID) | RECTAL | Status: DC | PRN

## 2019-06-30 MED ORDER — METHYLPREDNISOLONE SODIUM SUCC 40 MG IJ SOLR
20.0000 mg | Freq: Four times a day (QID) | INTRAMUSCULAR | Status: DC
  Administered 2019-06-30 – 2019-07-01 (×4): 20 mg via INTRAVENOUS
  Filled 2019-06-30 (×4): qty 1

## 2019-06-30 MED ORDER — MOMETASONE FURO-FORMOTEROL FUM 200-5 MCG/ACT IN AERO
2.0000 | INHALATION_SPRAY | Freq: Two times a day (BID) | RESPIRATORY_TRACT | Status: DC
  Administered 2019-06-30 – 2019-07-03 (×6): 2 via RESPIRATORY_TRACT
  Filled 2019-06-30: qty 8.8

## 2019-06-30 MED ORDER — SODIUM CHLORIDE 0.9% FLUSH
3.0000 mL | Freq: Two times a day (BID) | INTRAVENOUS | Status: DC
  Administered 2019-07-01 – 2019-07-03 (×3): 3 mL via INTRAVENOUS

## 2019-06-30 NOTE — ED Notes (Signed)
Kyle Mosley [phone].   Please call if needed before contacting wife.

## 2019-06-30 NOTE — H&P (Signed)
History and Physical  Kyle Mosley:[id number] DOB: 1935/11/10 DOA: 06/30/2019  Referring physician: Thurnell Garbe DO  PCP: Fayrene Helper, MD   Chief Complaint: not getting up  HPI: Kyle Mosley is a 84 y.o. male with advanced oxygen and steroid dependent COPD (3LNC), chronic atrial fibrillation/atrial flutter, fully anticoagulated on rivaroxaban, HTN, enlarged prostate, prediabetes and pulmonary hypertension was brought in by wife from home because she was concerned that patient was weak and not getting up and moving about as he usually does in last several days.  He denies chest pain.  He has chronic shortness of breath but not worse than normal. He complains of cough and chest congestion. No known sick contacts and has not had a fever or diarrhea.  He denies abdominal pain.  He is visually impaired.  He reports that he has been taking his medications as prescribed.  He has urinating.  He denies diarrhea.    ED Course: The patient was noted to have an elevated cardiac BNP.  His heart rate was elevated in the 120-130 range.  His high-sensitivity troponin was 6.  He was afebrile.  His blood pressure was normal.  The patient had a chest x-ray with findings of suspicious infiltrate in the right lower lobe.  The patient was noted to be wheezing and coughing on physical exam and given nebulizer treatments.  His COVID 19 test was negative.  His urinalysis was unremarkable.  His EKG showed findings of rapid atrial flutter similar to recent EKG from March 2020.  The patient was started on antibiotics and started on diltiazem for assistance with rate control and admission was requested.  Review of Systems: All systems reviewed and apart from history of presenting illness, are negative.  Past Medical History:  Diagnosis Date  . Asthma   . Atrial fibrillation ([place]) 03/31/2019   New onset a fib during 01/2019 admission, d/c on xarelto per cardiology recommendation  . Colon polyps   . COPD (chronic  obstructive pulmonary disease) ([place])    3L home O2  . Diverticulosis   . Enlarged prostate    with elevated PSA  . Hyperlipidemia   . Hypertension   . Prediabetes 2011  . Pulmonary hypertension ([place]) 2012   Past Surgical History:  Procedure Laterality Date  . COLONOSCOPY  03/21/2007   Dr. Jonny Ruiz diverticula, normal rectum  . COLONOSCOPY  05/04/2012   Procedure: COLONOSCOPY;  Surgeon: Daneil Dolin, MD;  Location: [facility];  Service: Endoscopy;  Laterality: N/A;  7:30  . PROSTATE BIOPSY     Social History:  reports that he quit smoking about 22 years ago. He has a 25.00 pack-year smoking history. He has never used smokeless tobacco. He reports that he does not drink alcohol or use drugs.  No Known Allergies  Family History  Problem Relation Age of Onset  . Heart attack Father   . Alcohol abuse Mother   . Hypertension Brother   . COPD Sister     Prior to Admission medications   Medication Sig Start Date End Date Taking? Authorizing Provider  albuterol (PROVENTIL) (2.5 MG/3ML) 0.083% nebulizer solution INHALE THE CONTENTS OF 1 VIAL VIA NEBULIZER TWO TO THREE TIMES DAILY Patient taking differently: Take 2.5 mg by nebulization 3 (three) times daily.  12/11/18   Fayrene Helper, MD  albuterol (VENTOLIN HFA) 108 (90 Base) MCG/ACT inhaler Inhale 2 puffs into the lungs every 6 (six) hours as needed for wheezing or shortness of breath. 02/09/18  Fayrene Helper, MD  feeding supplement, ENSURE ENLIVE, (ENSURE ENLIVE) LIQD Take 237 mLs by mouth 2 (two) times daily between meals. 11/30/17   Orson Eva, MD  Fluticasone-Salmeterol (ADVAIR DISKUS) 250-50 MCG/DOSE AEPB Inhale 1 puff into the lungs 2 (two) times daily. 03/31/19 06/29/19  Fayrene Helper, MD  furosemide (LASIX) 20 MG tablet TAKE 1 TABLET EVERY DAY 05/02/19   Fayrene Helper, MD  ipratropium (ATROVENT) 0.02 % nebulizer solution USE ONE VIAL IN NEBULIZER TWO TO THREE TIMES A DAY. Patient taking differently:  Take 0.5 mg by nebulization 3 (three) times daily.  12/11/18   Fayrene Helper, MD  lovastatin (MEVACOR) 10 MG tablet TAKE 1 TABLET AT BEDTIME 03/29/19   Fayrene Helper, MD  mirtazapine (REMERON) 7.5 MG tablet TAKE 1 TABLET AT BEDTIME 06/25/19   Fayrene Helper, MD  OXYGEN Inhale 3 L into the lungs continuous.    [provider]  pantoprazole (PROTONIX) 20 MG tablet Take 1 tablet (20 mg total) by mouth daily. 03/29/19   Fayrene Helper, MD  potassium chloride (K-DUR) 10 MEQ tablet Take 1 tablet (10 mEq total) by mouth daily. 12/04/18 12/05/19  Fayrene Helper, MD  predniSONE (DELTASONE) 5 MG tablet TAKE 1 TABLET (5 MG TOTAL) BY MOUTH DAILY. Patient taking differently: Take 5 mg by mouth daily with breakfast.  02/13/19   Fayrene Helper, MD  rivaroxaban (XARELTO) 20 MG TABS tablet Take 1 tablet (20 mg total) by mouth daily with supper. 04/05/19   Fayrene Helper, MD  tamsulosin (FLOMAX) 0.4 MG CAPS capsule TAKE 1 CAPSULE  DAILY AFTER SUPPER. 06/25/19   Fayrene Helper, MD  UNABLE TO FIND Nebulizer machine and tubing  DX J44.1 11/28/17   Fayrene Helper, MD   Physical Exam: Vitals:   06/30/19 1215 06/30/19 1230 06/30/19 1245 06/30/19 1300  BP:      Pulse:  (!) 126 (!) 127 (!) 126  Resp: 16 17 16 16   Temp:      TempSrc:      SpO2:  98% 99% 99%  Weight:      Height:        General exam: Moderately built and nourished patient, lying comfortably supine on the gurney in no obvious distress.  Head, eyes and ENT: Nontraumatic and normocephalic. Pupils equally reacting to light and accommodation. Oral mucosa dry.  Neck: Supple. No JVD, carotid bruit or thyromegaly.  Lymphatics: No lymphadenopathy.  Respiratory system: Clear to auscultation. No increased work of breathing.  Cardiovascular system: S1 and S2 heard, irregularly irregular.  Mild JVD, bilateral 1+ pitting pedal edema.  Gastrointestinal system: Abdomen is nondistended, soft and nontender. Normal  bowel sounds heard. No organomegaly or masses appreciated.  Central nervous system: Alert and oriented. No focal neurological deficits.  Extremities: Symmetric 5 x 5 power. Peripheral pulses symmetrically felt.   Skin: No rashes or acute findings.  Musculoskeletal system: 1-2+ edema bilateral lower extremities, symmetric.  Psychiatry: Pleasant and cooperative.  Labs on Admission:  Basic Metabolic Panel: Recent Labs  Lab 06/30/19 1114  NA 145  K 4.9  CL 99  CO2 40*  GLUCOSE 106*  BUN 26*  CREATININE 0.62  CALCIUM 8.6*   Liver Function Tests: No results for input(s): AST, ALT, ALKPHOS, BILITOT, PROT, ALBUMIN in the last 168 hours. No results for input(s): LIPASE, AMYLASE in the last 168 hours. No results for input(s): AMMONIA in the last 168 hours. CBC: Recent Labs  Lab 06/30/19 1114  WBC  5.4  NEUTROABS 4.3  HGB 11.7*  HCT 41.6  MCV 103.5*  PLT 312   Cardiac Enzymes: No results for input(s): CKTOTAL, CKMB, CKMBINDEX, TROPONINI in the last 168 hours.  BNP (last 3 results) No results for input(s): PROBNP in the last 8760 hours. CBG: No results for input(s): GLUCAP in the last 168 hours.  Radiological Exams on Admission: Dg Chest Port 1 View  Result Date: 06/30/2019 CLINICAL DATA:  Shortness of breath wheezing. EXAM: PORTABLE CHEST 1 VIEW COMPARISON:  02/18/2019 FINDINGS: Advanced emphysematous lung disease again noted. There is suggestion of potential acute infiltrate at the right lung base. No overt edema or pleural fluid. No pneumothorax. Stable heart size. IMPRESSION: Advanced emphysema with potential acute infiltrate at the right lung base. Electronically Signed   By: Aletta Edouard M.D.   On: 06/30/2019 12:05    EKG: This was personally reviewed.  Atrial flutter with rapid ventricular response  Assessment/Plan Principal Problem:   Atrial flutter with rapid ventricular response (HCC) Active Problems:   Hyperlipidemia LDL goal <100   COPD (chronic  obstructive pulmonary disease) (HCC)   Pulmonary HTN (HCC)   Reduced vision   Hypoxia   CAP (community acquired pneumonia)   Aortic aneurysm (HCC)   Chronic anticoagulation   Insomnia due to medical condition   Atrial fibrillation with tachycardic ventricular rate (HCC)   Supplemental oxygen dependent   Generalized weakness   Prediabetes   Anemia, chronic disease   Elevated brain natriuretic peptide (BNP) level  1. Atrial flutter with rapid ventricular response-patient seems to go in and out of atrial flutter/fibrillation.  This was diagnosed in March 2020.  The patient is not on any AV nodal blockers at this time for some reason.  He had been discharged on something in March however he does not seem to be taking it at this time.  His heart rate is in the 130 range.  He has been started on IV diltiazem.  I am going admit him to the stepdown unit continue the Cardizem infusion until we get the rate controlled and start him on AV nodal blockers hopefully by tomorrow.  The patient should be fully anticoagulated taking the Xarelto which we will continue.  The patient is also volume overloaded and he has been started on IV Lasix 20 mg twice daily.  Monitor electrolytes.  Potassium has been continued at 40 mEq daily.  Monitor weights and intake and output closely. 2. Advanced COPD-he is oxygen and steroid-dependent.  Given his acute symptoms we have ordered IV Solu-Medrol burst and plan to de-escalate to home prednisone over the next several days.  Schedule duo nebs, continue home bronchodilators.  Antibiotics as ordered.  Supportive therapy.  Continue oxygen with goal of keeping oxygen saturation greater than 87%.  Follow in the stepdown ICU. 3. Generalized weakness- likely multifactorial given his uncontrolled atrial fibrillation/flutter and pneumonia.  PT eval when medically stabilized. 4. Community-acquired pneumonia-patient has been started on ceftriaxone and doxycycline.  Continue supportive  therapy. 5. Prediabetes- he is steroid-dependent.  Check hemoglobin A1c. 6. Hyperlipidemia- check lipid panel, resume home lovastatin/pravastatin. 7. Anemia and chronic disease- follow hemoglobin closely, check anemia panel.  Hemoccult stool. 8. Chronic insomnia- trazodone ordered and lorazepam ordered to help with sedation as needed.  DVT Prophylaxis: Rivaroxaban Code Status: Full Family Communication: Wife Disposition Plan: Inpatient stepdown ICU for IV antibiotics IV steroids and IV diltiazem  Critical care Time spent: 35 mins  Kyle Iten Wynetta Emery, MD [facility] How to contact the [facility] Attending  or Consulting provider [place] or covering provider during after hours [place], for this patient?  1. Check the care team in [facility] and look for a) attending/consulting [facility] provider listed and b) the [facility] team listed 2. Log into [email or web address] and use [place] universal password to access. If you do not have the password, please contact the hospital operator. 3. Locate the [facility] provider you are looking for under [facility] and page to a number that you can be directly reached. 4. If you still have difficulty reaching the provider, please page the [facility] (Director on Call) for the Hospitalists listed on amion for assistance.

## 2019-06-30 NOTE — ED Provider Notes (Signed)
[facility] EMERGENCY DEPARTMENT Provider Note   CSN: [id number] Arrival date & time: 06/30/19  1030     History   Chief Complaint Chief Complaint  Patient presents with   Weakness    HPI Kyle Mosley is a 84 y.o. male.     HPI  Pt was seen at 1100. Per pt's wife, EMS report and pt: c/o gradual onset and persistence of constant generalized weakness for the past 3 days. Has been associated with SOB and increasing pedal edema for he past 1 week. Pt's wife told EMS pt "hasn't been getting up and moving as usual" but pt states to ED staff that he is "just in my wheelchair" and does not walk. Denies CP/palpitations, no cough, no fevers, no rash, no focal motor weakness, no abd pain, no N/V/D, no back pain.    Past Medical History:  Diagnosis Date   Asthma    Colon polyps    COPD (chronic obstructive pulmonary disease) ([place])    3L home O2   Diverticulosis    Enlarged prostate    with elevated PSA   Hyperlipidemia    Hypertension    Prediabetes 2011   Pulmonary hypertension ([place]) 2012    Patient Active Problem List   Diagnosis Date Noted   Atrial fibrillation with tachycardic ventricular rate ([place]) 06/30/2019   Atrial fibrillation ([place]) 03/31/2019   Chronic anticoagulation 03/31/2019   Insomnia due to medical condition 03/31/2019   Protein-calorie malnutrition, severe 11/29/2017   Aortic aneurysm ([place])    Hypoxia 12/28/2016   Loss of weight 05/27/2016   Elevated PSA 03/26/2015   Reduced vision 05/27/2014   Pulmonary HTN ([place]) 06/07/2013   COPD (chronic obstructive pulmonary disease) ([place]) 05/08/2013   Primary generalized (osteo)arthritis 01/01/2013   Bronchial asthma 09/30/2009   FATIGUE 06/23/2009   POLYP, COLON 04/08/2008   Hyperlipidemia LDL goal <100 04/08/2008    Past Surgical History:  Procedure Laterality Date   COLONOSCOPY  03/21/2007   Dr. Jonny Ruiz diverticula, normal rectum   COLONOSCOPY  05/04/2012   Procedure:  COLONOSCOPY;  Surgeon: Daneil Dolin, MD;  Location: [facility]ITE;  Service: Endoscopy;  Laterality: N/A;  7:30   PROSTATE BIOPSY          Home Medications    Prior to Admission medications   Medication Sig Start Date End Date Taking? Authorizing Provider  albuterol (PROVENTIL) (2.5 MG/3ML) 0.083% nebulizer solution INHALE THE CONTENTS OF 1 VIAL VIA NEBULIZER TWO TO THREE TIMES DAILY Patient taking differently: Take 2.5 mg by nebulization 3 (three) times daily.  12/11/18   Fayrene Helper, MD  albuterol (VENTOLIN HFA) 108 (90 Base) MCG/ACT inhaler Inhale 2 puffs into the lungs every 6 (six) hours as needed for wheezing or shortness of breath. 02/09/18   Fayrene Helper, MD  feeding supplement, ENSURE ENLIVE, (ENSURE ENLIVE) LIQD Take 237 mLs by mouth 2 (two) times daily between meals. 11/30/17   Orson Eva, MD  Fluticasone-Salmeterol (ADVAIR DISKUS) 250-50 MCG/DOSE AEPB Inhale 1 puff into the lungs 2 (two) times daily. 03/31/19 06/29/19  Fayrene Helper, MD  furosemide (LASIX) 20 MG tablet TAKE 1 TABLET EVERY DAY 05/02/19   Fayrene Helper, MD  ipratropium (ATROVENT) 0.02 % nebulizer solution USE ONE VIAL IN NEBULIZER TWO TO THREE TIMES A DAY. Patient taking differently: Take 0.5 mg by nebulization 3 (three) times daily.  12/11/18   Fayrene Helper, MD  lovastatin (MEVACOR) 10 MG tablet TAKE 1 TABLET AT BEDTIME 03/29/19  Fayrene Helper, MD  mirtazapine (REMERON) 7.5 MG tablet TAKE 1 TABLET AT BEDTIME 06/25/19   Fayrene Helper, MD  OXYGEN Inhale 3 L into the lungs continuous.    [provider]  pantoprazole (PROTONIX) 20 MG tablet Take 1 tablet (20 mg total) by mouth daily. 03/29/19   Fayrene Helper, MD  potassium chloride (K-DUR) 10 MEQ tablet Take 1 tablet (10 mEq total) by mouth daily. 12/04/18 12/05/19  Fayrene Helper, MD  predniSONE (DELTASONE) 5 MG tablet TAKE 1 TABLET (5 MG TOTAL) BY MOUTH DAILY. Patient taking differently: Take 5 mg by mouth daily  with breakfast.  02/13/19   Fayrene Helper, MD  rivaroxaban (XARELTO) 20 MG TABS tablet Take 1 tablet (20 mg total) by mouth daily with supper. 04/05/19   Fayrene Helper, MD  tamsulosin (FLOMAX) 0.4 MG CAPS capsule TAKE 1 CAPSULE  DAILY AFTER SUPPER. 06/25/19   Fayrene Helper, MD  UNABLE TO FIND Nebulizer machine and tubing  DX J44.1 11/28/17   Fayrene Helper, MD    Family History Family History  Problem Relation Age of Onset   Heart attack Father    Alcohol abuse Mother    Hypertension Brother    COPD Sister     Social History Social History   Tobacco Use   Smoking status: Former Smoker    Packs/day: 1.00    Years: 25.00    Pack years: 25.00    Quit date: 11/24/1996    Years since quitting: 22.6   Smokeless tobacco: Never Used  Substance Use Topics   Alcohol use: No   Drug use: No     Allergies   Patient has no known allergies.   Review of Systems Review of Systems ROS: Statement: All systems negative except as marked or noted in the HPI; Constitutional: Negative for fever and chills. +generalized weakness.; ; Eyes: Negative for eye pain, redness and discharge. ; ; ENMT: Negative for ear pain, hoarseness, nasal congestion, sinus pressure and sore throat. ; ; Cardiovascular: Negative for chest pain, palpitations, diaphoresis, +dyspnea and peripheral edema. ; ; Respiratory: Negative for cough, wheezing and stridor. ; ; Gastrointestinal: Negative for nausea, vomiting, diarrhea, abdominal pain, blood in stool, hematemesis, jaundice and rectal bleeding. . ; ; Genitourinary: Negative for dysuria, flank pain and hematuria. ; ; Musculoskeletal: Negative for back pain and neck pain. Negative for swelling and trauma.; ; Skin: Negative for pruritus, rash, abrasions, blisters, bruising and skin lesion.; ; Neuro: Negative for headache, lightheadedness and neck stiffness. Negative for altered level of consciousness, altered mental status, extremity weakness,  paresthesias, involuntary movement, seizure and syncope.       Physical Exam Updated Vital Signs BP 105/78    Pulse (!) 126    Temp 98.7 F (37.1 C) (Oral)    Resp 16    Ht 6' (1.829 m)    Wt 73 kg    SpO2 99%    BMI 21.83 kg/m    Patient Vitals for the past 24 hrs:  BP Temp Temp src Pulse Resp SpO2 Height Weight  06/30/19 1445 -- -- -- -- 20 -- -- --  06/30/19 1430 106/75 -- -- 95 19 100 % -- --  06/30/19 1415 -- -- -- -- 17 -- -- --  06/30/19 1400 105/75 -- -- -- 16 -- -- --  06/30/19 1300 -- -- -- (!) 126 16 99 % -- --  06/30/19 1245 -- -- -- (!) 127 16 99 % -- --  06/30/19 1230 -- -- -- (!) 126 17 98 % -- --  06/30/19 1215 -- -- -- -- 16 -- -- --  06/30/19 1200 -- -- -- -- 17 -- -- --  06/30/19 1145 -- -- -- -- 19 -- -- --  06/30/19 1133 -- -- -- -- -- 100 % -- --  06/30/19 1130 -- -- -- -- 17 -- -- --  06/30/19 1115 -- -- -- (!) 125 19 100 % -- --  06/30/19 1100 -- -- -- (!) 125 -- 97 % -- --  06/30/19 1051 105/78 -- -- -- -- -- -- --  06/30/19 1049 -- -- -- -- -- 94 % -- --  06/30/19 1045 -- 98.7 F (37.1 C) Oral (!) 128 (!) 24 -- 6' (1.829 m) 73 kg     Physical Exam 1105: Physical examination:  Nursing notes reviewed; Vital signs and O2 SAT reviewed;  Constitutional: Well developed, Well nourished, Well hydrated, In no acute distress; Head:  Normocephalic, atraumatic; Eyes: EOMI, PERRL, No scleral icterus; ENMT: Mouth and pharynx normal, Mucous membranes moist; Neck: Supple, Full range of motion, No lymphadenopathy; Cardiovascular: Tachycardic irregular rate and rhythm, No gallop; Respiratory: Breath sounds diminished & equal bilaterally, scattered exp wheezes.  No audible wheezing. Speaking full sentences with ease, Normal respiratory effort/excursion; Chest: Nontender, Movement normal; Abdomen: Soft, Nontender, Nondistended, Normal bowel sounds; Genitourinary: No CVA tenderness; Extremities: Peripheral pulses normal, No tenderness, +3 pedal edema bilat. No calf  asymmetry.; Neuro: AA&Ox3, No facial droop. Speech clear. No gross focal motor deficits in extremities.; Skin: Color normal, Warm, Dry.      ED Treatments / Results  Labs (all labs ordered are listed, but only abnormal results are displayed)   EKG EKG Interpretation  Date/Time:  Saturday June 30 2019 10:50:10 EDT Ventricular Rate:  125 PR Interval:    QRS Duration: 150 QT Interval:  371 QTC Calculation: 535 R Axis:   -93 Text Interpretation:  Sinus or ectopic atrial tachycardia vs  Atrial flutter Right bundle branch block Left anterior fasicular block Baseline wander Artifact When compared with ECG of 02/18/2019 No significant change was found Confirmed by Francine Graven [id number]) on 06/30/2019 10:59:31 AM   Radiology   Procedures Procedures (including critical care time)  Medications Ordered in ED Medications  cefTRIAXone (ROCEPHIN) 2 g in sodium chloride 0.9 % 100 mL IVPB (2 g Intravenous New Bag/Given 06/30/19 1316)  diltiazem (CARDIZEM) 1 mg/mL load via infusion 10 mg (has no administration in time range)    And  diltiazem (CARDIZEM) 100 mg in dextrose 5 % 100 mL (1 mg/mL) infusion (has no administration in time range)  albuterol (VENTOLIN HFA) 108 (90 Base) MCG/ACT inhaler 8 puff (8 puffs Inhalation Given 06/30/19 1133)  doxycycline (VIBRA-TABS) tablet 100 mg (100 mg Oral Given 06/30/19 1310)     Initial Impression / Assessment and Plan / ED Course  I have reviewed the triage vital signs and the nursing notes.  Pertinent labs & imaging results that were available during my care of the patient were reviewed by me and considered in my medical decision making (see chart for details).    MDM Reviewed: previous chart, nursing note and vitals Reviewed previous: labs and ECG Interpretation: labs, ECG and x-ray Total time providing critical care: 30-74 minutes. This excludes time spent performing separately reportable procedures and services. Consults: admitting  MD    CRITICAL CARE Performed by: Francine Graven Total critical care time: 45 minutes Critical care time was exclusive of separately billable  procedures and treating other patients. Critical care was necessary to treat or prevent imminent or life-threatening deterioration. Critical care was time spent personally by me on the following activities: development of treatment plan with patient and/or surrogate as well as nursing, discussions with consultants, evaluation of patient's response to treatment, examination of patient, obtaining history from patient or surrogate, ordering and performing treatments and interventions, ordering and review of laboratory studies, ordering and review of radiographic studies, pulse oximetry and re-evaluation of patient's condition.   Results for orders placed or performed during the hospital encounter of 06/30/19  SARS Coronavirus 2 [facility] order, Performed in [facility] hospital lab) Nasopharyngeal Nasopharyngeal Swab   Specimen: Nasopharyngeal Swab  Result Value Ref Range   SARS Coronavirus 2 NEGATIVE NEGATIVE  Basic metabolic panel  Result Value Ref Range   Sodium 145 135 - 145 mmol/L   Potassium 4.9 3.5 - 5.1 mmol/L   Chloride 99 98 - 111 mmol/L   CO2 40 (H) 22 - 32 mmol/L   Glucose, Bld 106 (H) 70 - 99 mg/dL   BUN 26 (H) 8 - 23 mg/dL   Creatinine, Ser 0.62 0.61 - 1.24 mg/dL   Calcium 8.6 (L) 8.9 - 10.3 mg/dL   GFR calc non Af Amer >60 >60 mL/min   GFR calc Af Amer >60 >60 mL/min   Anion gap 6 5 - 15  Brain natriuretic peptide  Result Value Ref Range   B Natriuretic Peptide 380.0 (H) 0.0 - 100.0 pg/mL  Lactic acid, plasma  Result Value Ref Range   Lactic Acid, Venous 0.6 0.5 - 1.9 mmol/L  CBC with Differential  Result Value Ref Range   WBC 5.4 4.0 - 10.5 K/uL   RBC 4.02 (L) 4.22 - 5.81 MIL/uL   Hemoglobin 11.7 (L) 13.0 - 17.0 g/dL   HCT 41.6 39.0 - 52.0 %   MCV 103.5 (H) 80.0 - 100.0 fL   MCH 29.1 26.0 - 34.0 pg   MCHC 28.1 (L) 30.0  - 36.0 g/dL   RDW 14.3 11.5 - 15.5 %   Platelets 312 150 - 400 K/uL   nRBC 0.0 0.0 - 0.2 %   Neutrophils Relative % 79 %   Neutro Abs 4.3 1.7 - 7.7 K/uL   Lymphocytes Relative 8 %   Lymphs Abs 0.5 (L) 0.7 - 4.0 K/uL   Monocytes Relative 12 %   Monocytes Absolute 0.6 0.1 - 1.0 K/uL   Eosinophils Relative 1 %   Eosinophils Absolute 0.1 0.0 - 0.5 K/uL   Basophils Relative 0 %   Basophils Absolute 0.0 0.0 - 0.1 K/uL   Immature Granulocytes 0 %   Abs Immature Granulocytes 0.02 0.00 - 0.07 K/uL  Urinalysis, Routine w reflex microscopic  Result Value Ref Range   Color, Urine AMBER (A) YELLOW   APPearance CLEAR CLEAR   Specific Gravity, Urine 1.024 1.005 - 1.030   pH 5.0 5.0 - 8.0   Glucose, UA NEGATIVE NEGATIVE mg/dL   Hgb urine dipstick NEGATIVE NEGATIVE   Bilirubin Urine NEGATIVE NEGATIVE   Ketones, ur NEGATIVE NEGATIVE mg/dL   Protein, ur NEGATIVE NEGATIVE mg/dL   Nitrite NEGATIVE NEGATIVE   Leukocytes,Ua NEGATIVE NEGATIVE  Troponin I (High Sensitivity)  Result Value Ref Range   Troponin I (High Sensitivity) 6 <18 ng/L   Dg Chest Port 1 View Result Date: 06/30/2019 CLINICAL DATA:  Shortness of breath wheezing. EXAM: PORTABLE CHEST 1 VIEW COMPARISON:  02/18/2019 FINDINGS: Advanced emphysematous lung disease again noted. There is suggestion of  potential acute infiltrate at the right lung base. No overt edema or pleural fluid. No pneumothorax. Stable heart size. IMPRESSION: Advanced emphysema with potential acute infiltrate at the right lung base. Electronically Signed   By: Aletta Edouard M.D.   On: 06/30/2019 12:05    Kyle Mosley was evaluated in Emergency Department on 06/30/2019 for the symptoms described in the history of present illness. He was evaluated in the context of the global COVID-19 pandemic, which necessitated consideration that the patient might be at risk for infection with the SARS-CoV-2 virus that causes COVID-19. Institutional protocols and algorithms that pertain  to the evaluation of patients at risk for COVID-19 are in a state of rapid change based on information released by regulatory bodies including the CDC and federal and state organizations. These policies and algorithms were followed during the patient's care in the ED.   1320:  Pt given albuterol MDI on arrival for wheezing with improvement. Pt states he "feels better after that." EKG/monitor appears afib with known RBBB; will start IV cardizem bolus/gtt. Given pt's prolonged QTc, IV rocephin and doxycycline started for CAP.  Dx and testing d/w pt.  Questions answered.  Verb understanding, agreeable to admit. T/C returned from [facility] Dr. Wynetta Emery, case discussed, including:  HPI, pertinent PM/SHx, VS/PE, dx testing, ED course and treatment:  Agreeable to admit.   1445:  Improved HR to 90's after IV cardizem.      Final Clinical Impressions(s) / ED Diagnoses   Final diagnoses:  None    ED Discharge Orders    None       Francine Graven, DO 07/02/19 [id number]

## 2019-06-30 NOTE — ED Triage Notes (Signed)
Pt brought in by ems where he lives with his wife. Pt has not been getting up and moving as usual. Pt is on 3l chronic oxygen.

## 2019-07-01 ENCOUNTER — Other Ambulatory Visit: Payer: Self-pay

## 2019-07-01 ENCOUNTER — Encounter (HOSPITAL_COMMUNITY): Payer: Self-pay | Admitting: *Deleted

## 2019-07-01 ENCOUNTER — Inpatient Hospital Stay (HOSPITAL_COMMUNITY): Payer: Medicare HMO

## 2019-07-01 DIAGNOSIS — I361 Nonrheumatic tricuspid (valve) insufficiency: Secondary | ICD-10-CM

## 2019-07-01 LAB — COMPREHENSIVE METABOLIC PANEL
ALT: 13 U/L (ref 0–44)
AST: 17 U/L (ref 15–41)
Albumin: 2.1 g/dL — ABNORMAL LOW (ref 3.5–5.0)
Alkaline Phosphatase: 57 U/L (ref 38–126)
Anion gap: 6 (ref 5–15)
BUN: 23 mg/dL (ref 8–23)
CO2: 40 mmol/L — ABNORMAL HIGH (ref 22–32)
Calcium: 8.2 mg/dL — ABNORMAL LOW (ref 8.9–10.3)
Chloride: 94 mmol/L — ABNORMAL LOW (ref 98–111)
Creatinine, Ser: 0.6 mg/dL — ABNORMAL LOW (ref 0.61–1.24)
GFR calc Af Amer: >60 mL/min (ref >60)
GFR calc non Af Amer: >60 mL/min (ref >60)
Glucose, Bld: 142 mg/dL — ABNORMAL HIGH (ref 70–99)
Potassium: 4.5 mmol/L (ref 3.5–5.1)
Sodium: 140 mmol/L (ref 135–145)
Total Bilirubin: 0.5 mg/dL (ref 0.3–1.2)
Total Protein: 5.8 g/dL — ABNORMAL LOW (ref 6.5–8.1)

## 2019-07-01 LAB — CBC WITH DIFFERENTIAL/PLATELET
Abs Immature Granulocytes: 0.02 10*3/uL (ref 0.00–0.07)
Basophils Absolute: 0 10*3/uL (ref 0.0–0.1)
Basophils Relative: 0 %
Eosinophils Absolute: 0 10*3/uL (ref 0.0–0.5)
Eosinophils Relative: 0 %
HCT: 37.9 % — ABNORMAL LOW (ref 39.0–52.0)
Hemoglobin: 10.9 g/dL — ABNORMAL LOW (ref 13.0–17.0)
Immature Granulocytes: 0 %
Lymphocytes Relative: 3 %
Lymphs Abs: 0.2 10*3/uL — ABNORMAL LOW (ref 0.7–4.0)
MCH: 28.8 pg (ref 26.0–34.0)
MCHC: 28.8 g/dL — ABNORMAL LOW (ref 30.0–36.0)
MCV: 100 fL (ref 80.0–100.0)
Monocytes Absolute: 0 10*3/uL — ABNORMAL LOW (ref 0.1–1.0)
Monocytes Relative: 1 %
Neutro Abs: 5.5 10*3/uL (ref 1.7–7.7)
Neutrophils Relative %: 96 %
Platelets: 272 10*3/uL (ref 150–400)
RBC: 3.79 MIL/uL — ABNORMAL LOW (ref 4.22–5.81)
RDW: 14.2 % (ref 11.5–15.5)
WBC: 5.7 10*3/uL (ref 4.0–10.5)
nRBC: 0 % (ref 0.0–0.2)

## 2019-07-01 LAB — URINE CULTURE: Culture: <10000 — AB

## 2019-07-01 LAB — MAGNESIUM: Magnesium: 2.1 mg/dL (ref 1.7–2.4)

## 2019-07-01 LAB — ECHOCARDIOGRAM COMPLETE
Height: 72 in
Weight: 2574.97 oz

## 2019-07-01 MED ORDER — IPRATROPIUM-ALBUTEROL 0.5-2.5 (3) MG/3ML IN SOLN
3.0000 mL | Freq: Three times a day (TID) | RESPIRATORY_TRACT | Status: DC
  Administered 2019-07-01 – 2019-07-03 (×8): 3 mL via RESPIRATORY_TRACT
  Filled 2019-07-01 (×7): qty 3

## 2019-07-01 MED ORDER — METHYLPREDNISOLONE SODIUM SUCC 40 MG IJ SOLR
20.0000 mg | INTRAMUSCULAR | Status: DC
  Administered 2019-07-02 – 2019-07-03 (×2): 20 mg via INTRAVENOUS
  Filled 2019-07-01 (×2): qty 1

## 2019-07-01 MED ORDER — FUROSEMIDE 10 MG/ML IJ SOLN
20.0000 mg | Freq: Every day | INTRAMUSCULAR | Status: DC
  Administered 2019-07-02: 20 mg via INTRAVENOUS
  Filled 2019-07-01: qty 2

## 2019-07-01 MED ORDER — DILTIAZEM HCL ER COATED BEADS 120 MG PO CP24
120.0000 mg | ORAL_CAPSULE | Freq: Every day | ORAL | Status: DC
  Administered 2019-07-01 – 2019-07-03 (×3): 120 mg via ORAL
  Filled 2019-07-01 (×4): qty 1

## 2019-07-01 NOTE — ED Notes (Signed)
ED TO INPATIENT HANDOFF REPORT  ED Nurse Name and Phone #:Araf Clugston  S Name/Age/Gender Kyle Mosley 84 y.o. male Room/Bed: APA17/APA17  Code Status   Code Status: Full Code  Home/SNF/Other Discharge Home Patient oriented to: selfyes  Is this baseline? Yes  Difficult to understand at times  Triage Complete: Triage complete  Chief Complaint Weakness  Triage Note Pt brought in by ems where he lives with his wife. Pt has not been getting up and moving as usual. Pt is on 3l chronic oxygen.    Allergies No Known Allergies  Level of Care/Admitting Diagnosis ED Disposition    ED Disposition Condition [facility] Area: [facility] [[id number]]  Level of Care: Telemetry [5]  Covid Evaluation: Confirmed COVID Negative  Diagnosis: Atrial flutter with rapid ventricular response [facility]) [[id number]]  Admitting Physician: [place], [place]  Attending Physician: Murlean Iba [[id number]]  Estimated length of stay: past midnight tomorrow  Certification:: I certify this patient will need inpatient services for at least 2 midnights  PT Class (Do Not Modify): Inpatient [101]  PT Acc Code (Do Not Modify): Private [1]       B Medical/Surgery History Past Medical History:  Diagnosis Date  . Asthma   . Atrial fibrillation ([place]) 03/31/2019   New onset a fib during 01/2019 admission, d/c on xarelto per cardiology recommendation  . Colon polyps   . COPD (chronic obstructive pulmonary disease) ([place])    3L home O2  . Diverticulosis   . Enlarged prostate    with elevated PSA  . Hyperlipidemia   . Hypertension   . Prediabetes 2011  . Pulmonary hypertension ([place]) 2012   Past Surgical History:  Procedure Laterality Date  . COLONOSCOPY  03/21/2007   Dr. Jonny Ruiz diverticula, normal rectum  . COLONOSCOPY  05/04/2012   Procedure: COLONOSCOPY;  Surgeon: Daneil Dolin, MD;  Location: [facility];  Service: Endoscopy;  Laterality: N/A;  7:30  . PROSTATE  BIOPSY       A IV Location/Drains/Wounds Patient Lines/Drains/Airways Status   Active Line/Drains/Airways    Name:   Placement date:   Placement time:   Site:   Days:   Peripheral IV 06/30/19 Left Arm   06/30/19    1230    Arm   1   Peripheral IV 06/30/19 Right Wrist   06/30/19    2216    Wrist   1          Intake/Output Last 24 hours  Intake/Output Summary (Last 24 hours) at 07/01/2019 1559 Last data filed at 07/01/2019 1043 Gross per 24 hour  Intake 3 ml  Output 2025 ml  Net -2022 ml    Labs/Imaging Results for orders placed or performed during the hospital encounter of 06/30/19 (from the past 48 hour(s))  SARS Coronavirus 2 [facility] order, Performed in [facility] hospital lab) Nasopharyngeal Nasopharyngeal Swab     Status: None   Collection Time: 06/30/19 11:06 AM   Specimen: Nasopharyngeal Swab  Result Value Ref Range   SARS Coronavirus 2 NEGATIVE NEGATIVE    Comment: (NOTE) If result is NEGATIVE SARS-CoV-2 target nucleic acids are NOT DETECTED. The SARS-CoV-2 RNA is generally detectable in upper and lower  respiratory specimens during the acute phase of infection. The lowest  concentration of SARS-CoV-2 viral copies this assay can detect is 250  copies / mL. A negative result does not preclude SARS-CoV-2 infection  and should not be used as the sole basis for  treatment or other  patient management decisions.  A negative result may occur with  improper specimen collection / handling, submission of specimen other  than nasopharyngeal swab, presence of viral mutation(s) within the  areas targeted by this assay, and inadequate number of viral copies  (<250 copies / mL). A negative result must be combined with clinical  observations, patient history, and epidemiological information. If result is POSITIVE SARS-CoV-2 target nucleic acids are DETECTED. The SARS-CoV-2 RNA is generally detectable in upper and lower  respiratory specimens dur ing the acute phase of  infection.  Positive  results are indicative of active infection with SARS-CoV-2.  Clinical  correlation with patient history and other diagnostic information is  necessary to determine patient infection status.  Positive results do  not rule out bacterial infection or co-infection with other viruses. If result is PRESUMPTIVE POSTIVE SARS-CoV-2 nucleic acids MAY BE PRESENT.   A presumptive positive result was obtained on the submitted specimen  and confirmed on repeat testing.  While 2019 novel coronavirus  (SARS-CoV-2) nucleic acids may be present in the submitted sample  additional confirmatory testing may be necessary for epidemiological  and / or clinical management purposes  to differentiate between  SARS-CoV-2 and other Sarbecovirus currently known to infect humans.  If clinically indicated additional testing with an alternate test  methodology [phone]) is advised. The SARS-CoV-2 RNA is generally  detectable in upper and lower respiratory sp ecimens during the acute  phase of infection. The expected result is Negative. Fact Sheet for Patients:  StrictlyIdeas.no Fact Sheet for Healthcare Providers: BankingDealers.co.za This test is not yet approved or cleared by the [place] FDA and has been authorized for detection and/or diagnosis of SARS-CoV-2 by FDA under an Emergency Use Authorization (EUA).  This EUA will remain in effect (meaning this test can be used) for the duration of the COVID-19 declaration under Section 564(b)(1) of the Act, 21 U.S.C. section 360bbb-3(b)(1), unless the authorization is terminated or revoked sooner. Performed at [facility], [address], [place], [place] [address]   Urinalysis, Routine w reflex microscopic     Status: Abnormal   Collection Time: 06/30/19 11:06 AM  Result Value Ref Range   Color, Urine AMBER (A) YELLOW    Comment: BIOCHEMICALS MAY BE AFFECTED BY COLOR   APPearance CLEAR CLEAR    Specific Gravity, Urine 1.024 1.005 - 1.030   pH 5.0 5.0 - 8.0   Glucose, UA NEGATIVE NEGATIVE mg/dL   Hgb urine dipstick NEGATIVE NEGATIVE   Bilirubin Urine NEGATIVE NEGATIVE   Ketones, ur NEGATIVE NEGATIVE mg/dL   Protein, ur NEGATIVE NEGATIVE mg/dL   Nitrite NEGATIVE NEGATIVE   Leukocytes,Ua NEGATIVE NEGATIVE    Comment: Performed at [facility], [address], [place], [place] [address]  Urine culture     Status: Abnormal   Collection Time: 06/30/19 11:06 AM   Specimen: Urine, Clean Catch  Result Value Ref Range   Specimen Description      URINE, CLEAN CATCH Performed at [facility], [address], [place], [place] [address]    Special Requests      NONE Performed at [facility], [address] [place], [place] [address]    Culture (A)     <10,000 COLONIES/mL INSIGNIFICANT GROWTH Performed at [facility] [address] [place]  [address]    Report Status 07/01/2019 FINAL   Basic metabolic panel     Status: Abnormal   Collection Time: 06/30/19 11:14 AM  Result Value Ref  Range   Sodium 145 135 - 145 mmol/L   Potassium 4.9 3.5 - 5.1 mmol/L   Chloride 99 98 - 111 mmol/L   CO2 40 (H) 22 - 32 mmol/L   Glucose, Bld 106 (H) 70 - 99 mg/dL   BUN 26 (H) 8 - 23 mg/dL   Creatinine, Ser 0.62 0.61 - 1.24 mg/dL   Calcium 8.6 (L) 8.9 - 10.3 mg/dL   GFR calc non Af Amer >60 >60 mL/min   GFR calc Af Amer >60 >60 mL/min   Anion gap 6 5 - 15    Comment: Performed at [facility], [address] [place], [place] [address]  Brain natriuretic peptide     Status: Abnormal   Collection Time: 06/30/19 11:14 AM  Result Value Ref Range   B Natriuretic Peptide 380.0 (H) 0.0 - 100.0 pg/mL    Comment: Performed at [facility], [address], [place], [place] [address]  Troponin I (High Sensitivity)     Status: None   Collection Time: 06/30/19 11:14 AM  Result Value Ref Range   Troponin I (High Sensitivity) 6 <18 ng/L    Comment: (NOTE) Elevated high sensitivity  troponin I (hsTnI) values and significant  changes across serial measurements may suggest ACS but many other  chronic and acute conditions are known to elevate hsTnI results.  Refer to the "Links" section for chest pain algorithms and additional  guidance. Performed at [facility], [address], [place], [place] [address]   Lactic acid, plasma     Status: None   Collection Time: 06/30/19 11:14 AM  Result Value Ref Range   Lactic Acid, Venous 0.6 0.5 - 1.9 mmol/L    Comment: Performed at [facility], [address], [place], [place] [address]  CBC with Differential     Status: Abnormal   Collection Time: 06/30/19 11:14 AM  Result Value Ref Range   WBC 5.4 4.0 - 10.5 K/uL   RBC 4.02 (L) 4.22 - 5.81 MIL/uL   Hemoglobin 11.7 (L) 13.0 - 17.0 g/dL   HCT 41.6 39.0 - 52.0 %   MCV 103.5 (H) 80.0 - 100.0 fL   MCH 29.1 26.0 - 34.0 pg   MCHC 28.1 (L) 30.0 - 36.0 g/dL   RDW 14.3 11.5 - 15.5 %   Platelets 312 150 - 400 K/uL   nRBC 0.0 0.0 - 0.2 %   Neutrophils Relative % 79 %   Neutro Abs 4.3 1.7 - 7.7 K/uL   Lymphocytes Relative 8 %   Lymphs Abs 0.5 (L) 0.7 - 4.0 K/uL   Monocytes Relative 12 %   Monocytes Absolute 0.6 0.1 - 1.0 K/uL   Eosinophils Relative 1 %   Eosinophils Absolute 0.1 0.0 - 0.5 K/uL   Basophils Relative 0 %   Basophils Absolute 0.0 0.0 - 0.1 K/uL   Immature Granulocytes 0 %   Abs Immature Granulocytes 0.02 0.00 - 0.07 K/uL    Comment: Performed at [facility], [address], [place], [place] [address]  Hemoglobin A1c     Status: None   Collection Time: 06/30/19  1:31 PM  Result Value Ref Range   Hgb A1c MFr Bld 5.5 4.8 - 5.6 %    Comment: (NOTE) Pre diabetes:          5.7%-6.4% Diabetes:              >6.4% Glycemic control for   <7.0% adults with diabetes    Mean Plasma Glucose 111.15 mg/dL  Comment: Performed at [facility], [place] [address] [place], [place] [address]  TSH     Status: None   Collection Time: 06/30/19  1:31 PM  Result Value Ref  Range   TSH 4.448 0.350 - 4.500 uIU/mL    Comment: Performed by a 3rd Generation assay with a functional sensitivity of <=0.01 uIU/mL. Performed at [facility], [address], [place], [place] [address]   Lipid panel     Status: None   Collection Time: 06/30/19  1:32 PM  Result Value Ref Range   Cholesterol 120 0 - 200 mg/dL   Triglycerides 33 <150 mg/dL   HDL 60 >40 mg/dL   Total CHOL/HDL Ratio 2.0 RATIO   VLDL 7 0 - 40 mg/dL   LDL Cholesterol 53 0 - 99 mg/dL    Comment:        Total Cholesterol/HDL:CHD Risk Coronary Heart Disease Risk Table                     Men   Women  1/2 Average Risk   3.4   3.3  Average Risk       5.0   4.4  2 X Average Risk   9.6   7.1  3 X Average Risk  23.4   11.0        Use the calculated Patient Ratio above and the CHD Risk Table to determine the patient's CHD Risk.        ATP III CLASSIFICATION (LDL):  <100     mg/dL   Optimal  100-129  mg/dL   Near or Above                    Optimal  130-159  mg/dL   Borderline  160-189  mg/dL   High  >190     mg/dL   Very High Performed at [facility], [address] [place], [place] [address]   Lactic acid, plasma     Status: None   Collection Time: 06/30/19  5:28 PM  Result Value Ref Range   Lactic Acid, Venous 0.7 0.5 - 1.9 mmol/L    Comment: Performed at [facility], [address] [place],  [address]  Troponin I (High Sensitivity)     Status: None   Collection Time: 06/30/19  5:28 PM  Result Value Ref Range   Troponin I (High Sensitivity) 8 <18 ng/L    Comment: (NOTE) Elevated high sensitivity troponin I (hsTnI) values and significant  changes across serial measurements may suggest ACS but many other  chronic and acute conditions are known to elevate hsTnI results.  Refer to the "Links" section for chest pain algorithms and additional  guidance. Performed at [facility], [address], [place],  [address]   CBC WITH DIFFERENTIAL     Status: Abnormal   Collection Time:  07/01/19  5:17 AM  Result Value Ref Range   WBC 5.7 4.0 - 10.5 K/uL   RBC 3.79 (L) 4.22 - 5.81 MIL/uL   Hemoglobin 10.9 (L) 13.0 - 17.0 g/dL   HCT 37.9 (L) 39.0 - 52.0 %   MCV 100.0 80.0 - 100.0 fL   MCH 28.8 26.0 - 34.0 pg   MCHC 28.8 (L) 30.0 - 36.0 g/dL   RDW 14.2 11.5 - 15.5 %   Platelets 272 150 - 400 K/uL   nRBC 0.0 0.0 - 0.2 %   Neutrophils Relative % 96 %   Neutro Abs 5.5  1.7 - 7.7 K/uL   Lymphocytes Relative 3 %   Lymphs Abs 0.2 (L) 0.7 - 4.0 K/uL   Monocytes Relative 1 %   Monocytes Absolute 0.0 (L) 0.1 - 1.0 K/uL   Eosinophils Relative 0 %   Eosinophils Absolute 0.0 0.0 - 0.5 K/uL   Basophils Relative 0 %   Basophils Absolute 0.0 0.0 - 0.1 K/uL   Immature Granulocytes 0 %   Abs Immature Granulocytes 0.02 0.00 - 0.07 K/uL    Comment: Performed at [facility], [address], [place], [place] [address]  Comprehensive metabolic panel     Status: Abnormal   Collection Time: 07/01/19  5:17 AM  Result Value Ref Range   Sodium 140 135 - 145 mmol/L   Potassium 4.5 3.5 - 5.1 mmol/L   Chloride 94 (L) 98 - 111 mmol/L   CO2 40 (H) 22 - 32 mmol/L   Glucose, Bld 142 (H) 70 - 99 mg/dL   BUN 23 8 - 23 mg/dL   Creatinine, Ser 0.60 (L) 0.61 - 1.24 mg/dL   Calcium 8.2 (L) 8.9 - 10.3 mg/dL   Total Protein 5.8 (L) 6.5 - 8.1 g/dL   Albumin 2.1 (L) 3.5 - 5.0 g/dL   AST 17 15 - 41 U/L   ALT 13 0 - 44 U/L   Alkaline Phosphatase 57 38 - 126 U/L   Total Bilirubin 0.5 0.3 - 1.2 mg/dL   GFR calc non Af Amer >60 >60 mL/min   GFR calc Af Amer >60 >60 mL/min   Anion gap 6 5 - 15    Comment: Performed at [facility], [address], [place], [place] [address]  Magnesium     Status: None   Collection Time: 07/01/19  5:17 AM  Result Value Ref Range   Magnesium 2.1 1.7 - 2.4 mg/dL    Comment: Performed at [facility], [address] [place], [place] [address]   Dg Chest Port 1 View  Result Date: 06/30/2019 CLINICAL DATA:  Shortness of breath wheezing. EXAM: PORTABLE CHEST 1 VIEW  COMPARISON:  02/18/2019 FINDINGS: Advanced emphysematous lung disease again noted. There is suggestion of potential acute infiltrate at the right lung base. No overt edema or pleural fluid. No pneumothorax. Stable heart size. IMPRESSION: Advanced emphysema with potential acute infiltrate at the right lung base. Electronically Signed   By: Aletta Edouard M.D.   On: 06/30/2019 12:05    Pending Labs Unresulted Labs (From admission, onward)    Start     Ordered   07/01/19 0500  CBC WITH DIFFERENTIAL  Daily,   R     06/30/19 1345   07/01/19 0500  Comprehensive metabolic panel  Daily,   R     06/30/19 1345   07/01/19 0500  Magnesium  Daily,   R     06/30/19 1345   06/30/19 1331  VITAMIN D 25 Hydroxy (Vit-D Deficiency, Fractures)  Add-on,   AD     06/30/19 1331          Vitals/Pain Today's Vitals   07/01/19 1200 07/01/19 1430 07/01/19 1500 07/01/19 1530  BP: 97/62  93/66 97/63  Pulse: 73  77 77  Resp: 18  13 17   Temp:      TempSrc:      SpO2: 100% 100% 99% 96%  Weight:      Height:      PainSc:        Isolation Precautions No active isolations  Medications Medications  cefTRIAXone (ROCEPHIN)  2 g in sodium chloride 0.9 % 100 mL IVPB (0 g Intravenous Stopped 07/01/19 1448)  doxycycline (VIBRA-TABS) tablet 100 mg (100 mg Oral Given 07/01/19 1011)  mometasone-formoterol (DULERA) 200-5 MCG/ACT inhaler 2 puff (2 puffs Inhalation Given 07/01/19 0718)  potassium chloride SA (K-DUR) CR tablet 30 mEq (30 mEq Oral Given 07/01/19 1010)  feeding supplement (ENSURE ENLIVE) (ENSURE ENLIVE) liquid 237 mL (237 mLs Oral Given 07/01/19 1428)  rivaroxaban (XARELTO) tablet 20 mg (20 mg Oral Given 06/30/19 1720)  tamsulosin (FLOMAX) capsule 0.4 mg (0.4 mg Oral Given 06/30/19 1832)  pantoprazole (PROTONIX) injection 40 mg (40 mg Intravenous Given 07/01/19 1416)  mirtazapine (REMERON) tablet 7.5 mg (7.5 mg Oral Given 06/30/19 2128)  pravastatin (PRAVACHOL) tablet 20 mg (20 mg Oral Given 06/30/19 1942)  sodium chloride  flush (NS) 0.9 % injection 3 mL (3 mLs Intravenous Not Given 07/01/19 0910)  sodium chloride flush (NS) 0.9 % injection 3 mL (3 mLs Intravenous Given 06/30/19 2216)  0.9 %  sodium chloride infusion (has no administration in time range)  acetaminophen (TYLENOL) tablet 650 mg (has no administration in time range)    Or  acetaminophen (TYLENOL) suppository 650 mg (has no administration in time range)  ondansetron (ZOFRAN) tablet 4 mg (has no administration in time range)    Or  ondansetron (ZOFRAN) injection 4 mg (has no administration in time range)  bisacodyl (DULCOLAX) EC tablet 5 mg (has no administration in time range)  guaiFENesin (MUCINEX) 12 hr tablet 1,200 mg (1,200 mg Oral Given 07/01/19 1010)  traZODone (DESYREL) tablet 50 mg (has no administration in time range)  LORazepam (ATIVAN) injection 0.5 mg (has no administration in time range)  ipratropium-albuterol (DUONEB) 0.5-2.5 (3) MG/3ML nebulizer solution 3 mL (3 mLs Nebulization Given 07/01/19 1430)  diltiazem (CARDIZEM CD) 24 hr capsule 120 mg (120 mg Oral Given 07/01/19 1010)  methylPREDNISolone sodium succinate (SOLU-MEDROL) 40 mg/mL injection 20 mg (has no administration in time range)  furosemide (LASIX) injection 20 mg (has no administration in time range)  albuterol (VENTOLIN HFA) 108 (90 Base) MCG/ACT inhaler 8 puff (8 puffs Inhalation Given 06/30/19 1133)  doxycycline (VIBRA-TABS) tablet 100 mg (100 mg Oral Given 06/30/19 1310)  diltiazem (CARDIZEM) 1 mg/mL load via infusion 10 mg (10 mg Intravenous Bolus from Bag 06/30/19 1353)  potassium chloride (K-DUR) 10 MEQ CR tablet (has no administration in time range)    Mobility walks with device High fall risk   Focused Assessments    R Recommendations: See Admitting Provider Note  Report given to:   Additional Notes:

## 2019-07-01 NOTE — ED Notes (Signed)
Wife notified pf pt being transferred to 300

## 2019-07-01 NOTE — Progress Notes (Signed)
*  PRELIMINARY RESULTS* Echocardiogram 2D Echocardiogram has been performed.  Leavy Cella 07/01/2019, 9:16 AM

## 2019-07-01 NOTE — Progress Notes (Addendum)
PROGRESS NOTE    Kyle SHAMMAS  YHC:[id number]  DOB: June 14, 1935  DOA: 06/30/2019 PCP: Fayrene Helper, MD   Brief Admission Hx:  84 y.o. male with advanced oxygen and steroid dependent COPD (3LNC), chronic atrial fibrillation/atrial flutter, fully anticoagulated on rivaroxaban, HTN, enlarged prostate, prediabetes and pulmonary hypertension was brought in by wife from home because she was concerned that patient was weak and found to be in Afib with RVR.   MDM/Assessment & Plan:   1. Atrial flutter/fib with rapid ventricular response-patient seems to go in and out of atrial flutter/fibrillation.  This was diagnosed in March 2020.  The patient was not on any AV nodal blockers on admission.  His heart rate was in the 130 range.  He has been started on IV diltiazem and his HR is much better controlled.  Start cardizem CD 120 mg daily.   The patient is fully anticoagulated taking the Xarelto.  The patient is also volume overloaded and he has been started on IV Lasix 20 mg twice daily.  Monitor electrolytes. 2D echo pending.   Potassium has been continued at 40 mEq daily.  Monitor weights and intake and output closely.  Family requesting inpatient cardiology consult because patient refused to go to his outpatient appt with them.  2. Advanced COPD-he is oxygen and steroid-dependent.  Given his acute symptoms we have ordered IV Solu-Medrol burst and plan to de-escalate to home prednisone over the next several days.  Schedule duo nebs, continue home bronchodilators.  Antibiotics as ordered.  Supportive therapy.  Continue oxygen with goal of keeping oxygen saturation greater than 87%.  Follow in the stepdown ICU. 3. Generalized weakness- likely multifactorial given his uncontrolled atrial fibrillation/flutter and pneumonia.  PT eval pending.  4. Community-acquired pneumonia-patient has been started on ceftriaxone and doxycycline.  Continue supportive therapy. 5. Prediabetes- he is steroid-dependent.   hemoglobin A1c is 5.5%. 6. Hyperlipidemia- lipids are very well controlled on home lovastatin/pravastatin. 7. Anemia and chronic disease- Hg slightly down with hydration, following.  8. Chronic insomnia- trazodone ordered and lorazepam ordered to help with sedation as needed.  DVT Prophylaxis: Rivaroxaban Code Status: Full Family Communication: son / telephone Disposition Plan: Inpatient stepdown ICU for IV antibiotics IV steroids and IV diltiazem  Consultants:    Procedures:  2D Echocardiogram (pending)  Antimicrobials:  Ceftriaxone/doxycycline    Subjective: Pt wants to eat breakfast, otherwise has no other complaints  Objective: Vitals:   07/01/19 0400 07/01/19 0500 07/01/19 0630 07/01/19 1100  BP: 102/68 119/76 104/73 99/62  Pulse: 82 82 81 67  Resp: 10 16 13 14   Temp:      TempSrc:      SpO2: 100% 100% 98% 99%  Weight:      Height:        Intake/Output Summary (Last 24 hours) at 07/01/2019 1142 Last data filed at 07/01/2019 1043 Gross per 24 hour  Intake 3 ml  Output 2025 ml  Net -2022 ml   Filed Weights   06/30/19 1045  Weight: 73 kg     REVIEW OF SYSTEMS  As per history otherwise all reviewed and reported negative  Exam:  General exam: elderly chronically ill appearing very fraile male. Lying in bed, NAD.  Respiratory system: rales RLL. No increased work of breathing. Cardiovascular system: irregularly irregular normal S1 & S2 heard.  Gastrointestinal system: Abdomen is nondistended, soft and nontender. Normal bowel sounds heard. Central nervous system: Alert and oriented. No focal neurological deficits. Extremities: 2+ edema BLEs.  Data  Reviewed: Basic Metabolic Panel: Recent Labs  Lab 06/30/19 1114 07/01/19 0517  NA 145 140  K 4.9 4.5  CL 99 94*  CO2 40* 40*  GLUCOSE 106* 142*  BUN 26* 23  CREATININE 0.62 0.60*  CALCIUM 8.6* 8.2*  MG  --  2.1   Liver Function Tests: Recent Labs  Lab 07/01/19 0517  AST 17  ALT 13  ALKPHOS 57   BILITOT 0.5  PROT 5.8*  ALBUMIN 2.1*   No results for input(s): LIPASE, AMYLASE in the last 168 hours. No results for input(s): AMMONIA in the last 168 hours. CBC: Recent Labs  Lab 06/30/19 1114 07/01/19 0517  WBC 5.4 5.7  NEUTROABS 4.3 5.5  HGB 11.7* 10.9*  HCT 41.6 37.9*  MCV 103.5* 100.0  PLT 312 272   Cardiac Enzymes: No results for input(s): CKTOTAL, CKMB, CKMBINDEX, TROPONINI in the last 168 hours. CBG (last 3)  No results for input(s): GLUCAP in the last 72 hours. Recent Results (from the past 240 hour(s))  SARS Coronavirus 2 [place] order, Performed in [facility] hospital lab) Nasopharyngeal Nasopharyngeal Swab     Status: None   Collection Time: 06/30/19 11:06 AM   Specimen: Nasopharyngeal Swab  Result Value Ref Range Status   SARS Coronavirus 2 NEGATIVE NEGATIVE Final    Comment: (NOTE) If result is NEGATIVE SARS-CoV-2 target nucleic acids are NOT DETECTED. The SARS-CoV-2 RNA is generally detectable in upper and lower  respiratory specimens during the acute phase of infection. The lowest  concentration of SARS-CoV-2 viral copies this assay can detect is 250  copies / mL. A negative result does not preclude SARS-CoV-2 infection  and should not be used as the sole basis for treatment or other  patient management decisions.  A negative result may occur with  improper specimen collection / handling, submission of specimen other  than nasopharyngeal swab, presence of viral mutation(s) within the  areas targeted by this assay, and inadequate number of viral copies  (<250 copies / mL). A negative result must be combined with clinical  observations, patient history, and epidemiological information. If result is POSITIVE SARS-CoV-2 target nucleic acids are DETECTED. The SARS-CoV-2 RNA is generally detectable in upper and lower  respiratory specimens dur ing the acute phase of infection.  Positive  results are indicative of active infection with SARS-CoV-2.   Clinical  correlation with patient history and other diagnostic information is  necessary to determine patient infection status.  Positive results do  not rule out bacterial infection or co-infection with other viruses. If result is PRESUMPTIVE POSTIVE SARS-CoV-2 nucleic acids MAY BE PRESENT.   A presumptive positive result was obtained on the submitted specimen  and confirmed on repeat testing.  While 2019 novel coronavirus  (SARS-CoV-2) nucleic acids may be present in the submitted sample  additional confirmatory testing may be necessary for epidemiological  and / or clinical management purposes  to differentiate between  SARS-CoV-2 and other Sarbecovirus currently known to infect humans.  If clinically indicated additional testing with an alternate test  methodology [phone]) is advised. The SARS-CoV-2 RNA is generally  detectable in upper and lower respiratory sp ecimens during the acute  phase of infection. The expected result is Negative. Fact Sheet for Patients:  StrictlyIdeas.no Fact Sheet for Healthcare Providers: BankingDealers.co.za This test is not yet approved or cleared by the [place] FDA and has been authorized for detection and/or diagnosis of SARS-CoV-2 by FDA under an Emergency Use Authorization (EUA).  This EUA will remain in  effect (meaning this test can be used) for the duration of the COVID-19 declaration under Section 564(b)(1) of the Act, 21 U.S.C. section 360bbb-3(b)(1), unless the authorization is terminated or revoked sooner. Performed at [facility], [address], [place], [place] [address]      Studies: Dg Chest [facility] 1 View  Result Date: 06/30/2019 CLINICAL DATA:  Shortness of breath wheezing. EXAM: PORTABLE CHEST 1 VIEW COMPARISON:  02/18/2019 FINDINGS: Advanced emphysematous lung disease again noted. There is suggestion of potential acute infiltrate at the right lung base. No overt edema or pleural  fluid. No pneumothorax. Stable heart size. IMPRESSION: Advanced emphysema with potential acute infiltrate at the right lung base. Electronically Signed   By: Aletta Edouard M.D.   On: 06/30/2019 12:05     Scheduled Meds: . diltiazem  120 mg Oral Daily  . doxycycline  100 mg Oral Q12H  . feeding supplement (ENSURE ENLIVE)  237 mL Oral BID BM  . [START ON 07/02/2019] furosemide  20 mg Intravenous Daily  . guaiFENesin  1,200 mg Oral BID  . ipratropium-albuterol  3 mL Nebulization TID  . [START ON 07/02/2019] methylPREDNISolone (SOLU-MEDROL) injection  20 mg Intravenous Q24H  . mirtazapine  7.5 mg Oral QHS  . mometasone-formoterol  2 puff Inhalation BID  . pantoprazole (PROTONIX) IV  40 mg Intravenous Q24H  . potassium chloride SA  30 mEq Oral Daily  . pravastatin  20 mg Oral q1800  . rivaroxaban  20 mg Oral Q supper  . sodium chloride flush  3 mL Intravenous Q12H  . tamsulosin  0.4 mg Oral QPC supper   Continuous Infusions: . sodium chloride    . cefTRIAXone (ROCEPHIN)  IV Stopped (06/30/19 2036)    Principal Problem:   Atrial flutter with rapid ventricular response (HCC) Active Problems:   Hyperlipidemia LDL goal <100   COPD (chronic obstructive pulmonary disease) (HCC)   Pulmonary HTN (HCC)   Reduced vision   Hypoxia   CAP (community acquired pneumonia)   Aortic aneurysm (HCC)   Chronic anticoagulation   Insomnia due to medical condition   Atrial fibrillation with tachycardic ventricular rate (HCC)   Supplemental oxygen dependent   Generalized weakness   Prediabetes   Anemia, chronic disease   Elevated brain natriuretic peptide (BNP) level  Critical Care Time spent: 32 minutes   Irwin Brakeman, MD [facility] 07/01/2019, 11:42 AM    LOS: 1 day  How to contact the [facility] Attending or Consulting provider [place] or covering provider during after hours [place], for this patient?  1. Check the care team in [facility] and look for a) attending/consulting [facility] provider listed  and b) the [facility] team listed 2. Log into [email or web address] and use [facility] universal password to access. If you do not have the password, please contact the hospital operator. 3. Locate the [facility] provider you are looking for under [facility] and page to a number that you can be directly reached. 4. If you still have difficulty reaching the provider, please page the [facility] (Director on Call) for the Hospitalists listed on amion for assistance.

## 2019-07-02 DIAGNOSIS — J189 Pneumonia, unspecified organism: Secondary | ICD-10-CM

## 2019-07-02 DIAGNOSIS — J438 Other emphysema: Secondary | ICD-10-CM

## 2019-07-02 DIAGNOSIS — R600 Localized edema: Secondary | ICD-10-CM

## 2019-07-02 DIAGNOSIS — I4892 Unspecified atrial flutter: Secondary | ICD-10-CM

## 2019-07-02 LAB — CBC WITH DIFFERENTIAL/PLATELET
Abs Immature Granulocytes: 0.02 10*3/uL (ref 0.00–0.07)
Basophils Absolute: 0 10*3/uL (ref 0.0–0.1)
Basophils Relative: 0 %
Eosinophils Absolute: 0 10*3/uL (ref 0.0–0.5)
Eosinophils Relative: 0 %
HCT: 37.5 % — ABNORMAL LOW (ref 39.0–52.0)
Hemoglobin: 11 g/dL — ABNORMAL LOW (ref 13.0–17.0)
Immature Granulocytes: 0 %
Lymphocytes Relative: 3 %
Lymphs Abs: 0.3 10*3/uL — ABNORMAL LOW (ref 0.7–4.0)
MCH: 28.9 pg (ref 26.0–34.0)
MCHC: 29.3 g/dL — ABNORMAL LOW (ref 30.0–36.0)
MCV: 98.7 fL (ref 80.0–100.0)
Monocytes Absolute: 0.8 10*3/uL (ref 0.1–1.0)
Monocytes Relative: 9 %
Neutro Abs: 7.9 10*3/uL — ABNORMAL HIGH (ref 1.7–7.7)
Neutrophils Relative %: 88 %
Platelets: 273 10*3/uL (ref 150–400)
RBC: 3.8 MIL/uL — ABNORMAL LOW (ref 4.22–5.81)
RDW: 13.8 % (ref 11.5–15.5)
WBC: 9 10*3/uL (ref 4.0–10.5)
nRBC: 0 % (ref 0.0–0.2)

## 2019-07-02 LAB — COMPREHENSIVE METABOLIC PANEL
ALT: 11 U/L (ref 0–44)
AST: 16 U/L (ref 15–41)
Albumin: 2 g/dL — ABNORMAL LOW (ref 3.5–5.0)
Alkaline Phosphatase: 50 U/L (ref 38–126)
Anion gap: 8 (ref 5–15)
BUN: 22 mg/dL (ref 8–23)
CO2: 38 mmol/L — ABNORMAL HIGH (ref 22–32)
Calcium: 8.2 mg/dL — ABNORMAL LOW (ref 8.9–10.3)
Chloride: 92 mmol/L — ABNORMAL LOW (ref 98–111)
Creatinine, Ser: 0.56 mg/dL — ABNORMAL LOW (ref 0.61–1.24)
GFR calc Af Amer: >60 mL/min (ref >60)
GFR calc non Af Amer: >60 mL/min (ref >60)
Glucose, Bld: 101 mg/dL — ABNORMAL HIGH (ref 70–99)
Potassium: 4.7 mmol/L (ref 3.5–5.1)
Sodium: 138 mmol/L (ref 135–145)
Total Bilirubin: 0.3 mg/dL (ref 0.3–1.2)
Total Protein: 5.3 g/dL — ABNORMAL LOW (ref 6.5–8.1)

## 2019-07-02 LAB — VITAMIN D 25 HYDROXY (VIT D DEFICIENCY, FRACTURES): Vit D, 25-Hydroxy: 38.2 ng/mL (ref 30.0–100.0)

## 2019-07-02 LAB — MAGNESIUM: Magnesium: 2.1 mg/dL (ref 1.7–2.4)

## 2019-07-02 NOTE — Consult Note (Addendum)
Cardiology Consultation:   Patient ID: Kyle Mosley MRN: [id number]; DOB: [birth year]  Admit date: 06/30/2019 Date of Consult: 07/02/2019  Primary Care Provider: Fayrene Helper, MD Primary Cardiologist: Kate Sable, MD  new Primary Electrophysiologist:  None     Patient Profile:   Kyle Mosley is a 84 y.o. male with a hx of COPD on home O2, pulmonary HTN who is being seen today for the evaluation of Afib  at the request of Dr. Wynetta Emery.  History of Present Illness:   [name] with history of COPD on home O2, pulmonary HTN, diagnosed with Afib 01/2019 in setting of pneumonia. Sent home on Xarelto but no rate lowering meds because of soft BP. Was treated with IV lopressor that admission.  Patient came to ER yesterday with progressive weakness and was found to be in Afib at 120-130/m, elevated BNP, and R lower lobe infiltrate on CXR.Patient says he developed lower extremity leg swelling 3 weeks ago. Only feels HR race if he is trying to rush around. Denies chest pain. Nonproductive cough and wheezing.  Heart Pathway Score:     Past Medical History:  Diagnosis Date  . Asthma   . Atrial fibrillation ([place]) 03/31/2019   New onset a fib during 01/2019 admission, d/c on xarelto per cardiology recommendation  . Colon polyps   . COPD (chronic obstructive pulmonary disease) ([place])    3L home O2  . Diverticulosis   . Enlarged prostate    with elevated PSA  . Hyperlipidemia   . Hypertension   . Prediabetes 2011  . Pulmonary hypertension ([place]) 2012    Past Surgical History:  Procedure Laterality Date  . COLONOSCOPY  03/21/2007   Dr. Jonny Ruiz diverticula, normal rectum  . COLONOSCOPY  05/04/2012   Procedure: COLONOSCOPY;  Surgeon: Daneil Dolin, MD;  Location: [facility];  Service: Endoscopy;  Laterality: N/A;  7:30  . PROSTATE BIOPSY       Home Medications:  Prior to Admission medications   Medication Sig Start Date End Date Taking? Authorizing Provider  albuterol  (PROVENTIL) (2.5 MG/3ML) 0.083% nebulizer solution INHALE THE CONTENTS OF 1 VIAL VIA NEBULIZER TWO TO THREE TIMES DAILY Patient taking differently: Take 2.5 mg by nebulization 3 (three) times daily.  12/11/18  Yes Fayrene Helper, MD  albuterol (VENTOLIN HFA) 108 (90 Base) MCG/ACT inhaler Inhale 2 puffs into the lungs every 6 (six) hours as needed for wheezing or shortness of breath. 02/09/18  Yes Fayrene Helper, MD  Fluticasone-Salmeterol (ADVAIR DISKUS) 250-50 MCG/DOSE AEPB Inhale 1 puff into the lungs 2 (two) times daily. 03/31/19 06/30/19 Yes Fayrene Helper, MD  furosemide (LASIX) 20 MG tablet TAKE 1 TABLET EVERY DAY 05/02/19  Yes Fayrene Helper, MD  ipratropium (ATROVENT) 0.02 % nebulizer solution USE ONE VIAL IN NEBULIZER TWO TO THREE TIMES A DAY. Patient taking differently: Take 0.5 mg by nebulization 3 (three) times daily.  12/11/18  Yes Fayrene Helper, MD  lovastatin (MEVACOR) 10 MG tablet TAKE 1 TABLET AT BEDTIME 03/29/19  Yes Fayrene Helper, MD  mirtazapine (REMERON) 7.5 MG tablet TAKE 1 TABLET AT BEDTIME 06/25/19  Yes Fayrene Helper, MD  pantoprazole (PROTONIX) 20 MG tablet Take 1 tablet (20 mg total) by mouth daily. 03/29/19  Yes Fayrene Helper, MD  potassium chloride (K-DUR) 10 MEQ tablet Take 1 tablet (10 mEq total) by mouth daily. 12/04/18 12/05/19 Yes Fayrene Helper, MD  rivaroxaban (XARELTO) 20 MG TABS tablet Take 1 tablet (20  mg total) by mouth daily with supper. 04/05/19  Yes Fayrene Helper, MD  tamsulosin (FLOMAX) 0.4 MG CAPS capsule TAKE 1 CAPSULE  DAILY AFTER SUPPER. 06/25/19  Yes Fayrene Helper, MD  feeding supplement, ENSURE ENLIVE, (ENSURE ENLIVE) LIQD Take 237 mLs by mouth 2 (two) times daily between meals. Patient not taking: Reported on 06/30/2019 11/30/17   Tat, Shanon Brow, MD  predniSONE (DELTASONE) 5 MG tablet TAKE 1 TABLET (5 MG TOTAL) BY MOUTH DAILY. Patient not taking: No sig reported 02/13/19   Fayrene Helper, MD  UNABLE TO FIND  Nebulizer machine and tubing  DX J44.1 Patient not taking: Reported on 06/30/2019 11/28/17   Fayrene Helper, MD    Inpatient Medications: Scheduled Meds: . diltiazem  120 mg Oral Daily  . doxycycline  100 mg Oral Q12H  . feeding supplement (ENSURE ENLIVE)  237 mL Oral BID BM  . furosemide  20 mg Intravenous Daily  . guaiFENesin  1,200 mg Oral BID  . ipratropium-albuterol  3 mL Nebulization TID  . methylPREDNISolone (SOLU-MEDROL) injection  20 mg Intravenous Q24H  . mirtazapine  7.5 mg Oral QHS  . mometasone-formoterol  2 puff Inhalation BID  . pantoprazole (PROTONIX) IV  40 mg Intravenous Q24H  . potassium chloride SA  30 mEq Oral Daily  . pravastatin  20 mg Oral q1800  . rivaroxaban  20 mg Oral Q supper  . sodium chloride flush  3 mL Intravenous Q12H  . tamsulosin  0.4 mg Oral QPC supper   Continuous Infusions: . sodium chloride    . cefTRIAXone (ROCEPHIN)  IV Stopped (07/01/19 1448)   PRN Meds: sodium chloride, acetaminophen **OR** acetaminophen, bisacodyl, LORazepam, ondansetron **OR** ondansetron (ZOFRAN) IV, sodium chloride flush, traZODone  Allergies:   No Known Allergies  Social History:   Social History   Socioeconomic History  . Marital status: Married    Spouse name: Not on file  . Number of children: 3  . Years of education: Not on file  . Highest education level: Not on file  Occupational History  . Occupation: retired   Scientific laboratory technician  . Financial resource strain: Not hard at all  . Food insecurity    Worry: Never true    Inability: Never true  . Transportation needs    Medical: No    Non-medical: No  Tobacco Use  . Smoking status: Former Smoker    Packs/day: 1.00    Years: 25.00    Pack years: 25.00    Quit date: 11/24/1996    Years since quitting: 22.6  . Smokeless tobacco: Never Used  Substance and Sexual Activity  . Alcohol use: No  . Drug use: No  . Sexual activity: Not Currently  Lifestyle  . Physical activity    Days per week: 1 day     Minutes per session: 10 min  . Stress: Only a little  Relationships  . Social Herbalist on phone: Three times a week    Gets together: Three times a week    Attends religious service: More than 4 times per year    Active member of club or organization: No    Attends meetings of clubs or organizations: Never    Relationship status: Married  . Intimate partner violence    Fear of current or ex partner: No    Emotionally abused: No    Physically abused: No    Forced sexual activity: No  Other Topics Concern  . Not on file  Social History Narrative  . Not on file    Family History:     Family History  Problem Relation Age of Onset  . Heart attack Father   . Alcohol abuse Mother   . Hypertension Brother   . COPD Sister      ROS:  Please see the history of present illness.   Review of Systems  Constitution: Positive for malaise/fatigue.  HENT: Negative.   Cardiovascular: Positive for leg swelling.  Respiratory: Positive for cough, shortness of breath and wheezing.   Endocrine: Negative.   Hematologic/Lymphatic: Negative.   Musculoskeletal: Positive for arthritis.  Gastrointestinal: Negative.   Genitourinary: Negative.   Neurological: Positive for weakness.    All other ROS reviewed and negative.     Physical Exam/Data:   Vitals:   07/02/19 0507 07/02/19 0815 07/02/19 0823 07/02/19 0914  BP: 113/80   112/82  Pulse: 60     Resp:      Temp: 98.1 F (36.7 C)     TempSrc: Oral     SpO2: 97% 99% 100%   Weight:      Height:        Intake/Output Summary (Last 24 hours) at 07/02/2019 0935 Last data filed at 07/02/2019 0300 Gross per 24 hour  Intake 320.28 ml  Output 1625 ml  Net -1304.72 ml   Last 3 Weights 07/02/2019 06/30/2019 04/05/2019  Weight (lbs) 171 lb 11.8 oz 160 lb 15 oz 160 lb  Weight (kg) 77.9 kg 73 kg 72.576 kg     Body mass index is 23.29 kg/m.  General: Thin, elderly on O2 HEENT: normal Lymph: no adenopathy Neck: no JVD Endocrine:   No thryomegaly Vascular: No carotid bruits; FA pulses 2+ bilaterally without bruits  Cardiac:  normal S1, S2; RRR; no murmur distant HS Lungs:  Diffuse inspiratory/exp wheezing throughout  Abd: soft, nontender, no hepatomegaly  Ext: 2-3 edema Musculoskeletal:  No deformities, BUE and BLE strength normal and equal Skin: warm and dry  Neuro:  CNs 2-12 intact, no focal abnormalities noted Psych:  Normal affect   EKG:  The EKG was personally reviewed and demonstrates:  06/30/19 Atrial flutter with RBBB and LAFB Telemetry:  Telemetry was personally reviewed and demonstrates:  Atrial flutter with some rates 120-130  Relevant CV Studies:  Echo 8/9/20IMPRESSIONS      1. The left ventricle has normal systolic function with an ejection fraction of 60-65%. The cavity size was normal. There is mildly increased left ventricular wall thickness. Left ventricular diastolic function could not be evaluated secondary to atrial  flutter. No evidence of left ventricular regional wall motion abnormalities.  2. The right ventricle has mildly reduced systolic function. The cavity was normal. There is no increase in right ventricular wall thickness. Right ventricular systolic pressure is mildly elevated.  3. The aortic valve is tricuspid. Mild sclerosis of the aortic valve. Aortic valve regurgitation was not assessed by color flow Doppler.  4. Right atrial size was mildly dilated.  5. Left atrial size was mildly dilated.  6. The inferior vena cava was dilated in size with >50% respiratory variability.  7. The aorta is normal in size and structure.   FINDINGS  Left Ventricle: The left ventricle has normal systolic function, with an ejection fraction of 60-65%. The cavity size was normal. There is mildly increased left ventricular wall thickness. Left ventricular diastolic function could not be evaluated  secondary to atrial fibrillation. No evidence of left ventricular regional wall motion abnormalities..   Right  Ventricle: The right ventricle has mildly reduced systolic function. The cavity was normal. There is no increase in right ventricular wall thickness. Right ventricular systolic pressure is mildly elevated.   Left Atrium: Left atrial size was mildly dilated.   Right Atrium: Right atrial size was mildly dilated.   Interatrial Septum: No atrial level shunt detected by color flow Doppler.   Pericardium: There is no evidence of pericardial effusion.   Mitral Valve: The mitral valve is normal in structure. Mitral valve regurgitation is trivial by color flow Doppler.   Tricuspid Valve: The tricuspid valve is normal in structure. Tricuspid valve regurgitation is mild by color flow Doppler.   Aortic Valve: The aortic valve is tricuspid Mild sclerosis of the aortic valve. Aortic valve regurgitation was not assessed by color flow Doppler.   Pulmonic Valve: The pulmonic valve was normal in structure. Pulmonic valve regurgitation is trivial by color flow Doppler.   Aorta: The aorta is normal in size and structure.   Venous: The inferior vena cava measures 2.28 cm, is dilated in size with greater than 50% respiratory variability.   Laboratory Data:  High Sensitivity Troponin:   Recent Labs  Lab 06/30/19 1114 06/30/19 1728  TROPONINIHS 6 8     Cardiac EnzymesNo results for input(s): TROPONINI in the last 168 hours. No results for input(s): TROPIPOC in the last 168 hours.  Chemistry Recent Labs  Lab 06/30/19 1114 07/01/19 0517 07/02/19 0610  NA 145 140 138  K 4.9 4.5 4.7  CL 99 94* 92*  CO2 40* 40* 38*  GLUCOSE 106* 142* 101*  BUN 26* 23 22  CREATININE 0.62 0.60* 0.56*  CALCIUM 8.6* 8.2* 8.2*  GFRNONAA >60 >60 >60  GFRAA >60 >60 >60  ANIONGAP 6 6 8     Recent Labs  Lab 07/01/19 0517 07/02/19 0610  PROT 5.8* 5.3*  ALBUMIN 2.1* 2.0*  AST 17 16  ALT 13 11  ALKPHOS 57 50  BILITOT 0.5 0.3   Hematology Recent Labs  Lab 06/30/19 1114 07/01/19 0517 07/02/19 0610  WBC 5.4  5.7 9.0  RBC 4.02* 3.79* 3.80*  HGB 11.7* 10.9* 11.0*  HCT 41.6 37.9* 37.5*  MCV 103.5* 100.0 98.7  MCH 29.1 28.8 28.9  MCHC 28.1* 28.8* 29.3*  RDW 14.3 14.2 13.8  PLT 312 272 273   BNP Recent Labs  Lab 06/30/19 1114  BNP 380.0*    DDimer No results for input(s): DDIMER in the last 168 hours.   Radiology/Studies:  Dg Chest Port 1 View  Result Date: 06/30/2019 CLINICAL DATA:  Shortness of breath wheezing. EXAM: PORTABLE CHEST 1 VIEW COMPARISON:  02/18/2019 FINDINGS: Advanced emphysematous lung disease again noted. There is suggestion of potential acute infiltrate at the right lung base. No overt edema or pleural fluid. No pneumothorax. Stable heart size. IMPRESSION: Advanced emphysema with potential acute infiltrate at the right lung base. Electronically Signed   By: Aletta Edouard M.D.   On: 06/30/2019 12:05    Assessment and Plan:   1. Atrial fib/flutter with RVR on Xarelto since diagnosed in March but wasn't sent home on rate lowering meds with soft BP.CHADS2Vasc=4, Now on diltiazem 120 mg daily. LA only mildly dilated. Could potentially plan on DCCV 2. Acute on chronic diastolic CHF on IV Lasix 20 mg daily I/O's negative 2 L since admission 3. COPD on home O2 and steroid dependent 4. CAP on ceftriaxone and doxy 5. Pulmonary HTN 6. Hyperlipidemia on pravachol      For questions or updates, please  contact [place] Please consult [email or web address] for contact info under     Signed, Ermalinda Barrios, PA-C  07/02/2019 9:35 AM   The patient was seen and examined, and I agree with the history, physical exam, assessment and plan as documented above, with modifications as noted below. I have also personally reviewed all relevant documentation, old records, labs, and both radiographic and cardiovascular studies. I have also independently interpreted old and new ECG's.  Briefly, this is an 84 year old male with a history of steroid and oxygen dependent COPD and paroxysmal atrial  flutter.  This was initially diagnosed in March 2020 in the context of pneumonia.  He is currently hospitalized and being treated for an acute COPD exacerbation as well as probable pneumonia.  He had some lower extremity edema and was found to be in rapid atrial flutter.  He was initially started on short acting diltiazem which was converted to long-acting diltiazem 120 mg daily.  He appears to go in and out of flutter.  He is currently in atrial flutter with a heart rate in the 90 bpm range.  He is anticoagulated with Xarelto.  Echocardiogram reviewed above with normal left ventricular systolic function mild left atrial enlargement.  I would continue long-acting diltiazem and Xarelto.  Symptoms have considerably improved.  Currently on IV Lasix 20 mg daily.  This can be switched to oral Lasix 20 mg daily prior to discharge.   Kate Sable, MD, [facility]  07/02/2019 10:32 AM  Communicated with Dr. Wynetta Emery.  [facility] will sign off.   Medication Recommendations:  As above   Other recommendations (labs, testing, etc):  None Follow up as an outpatient:  Will arrange

## 2019-07-02 NOTE — Progress Notes (Signed)
PROGRESS NOTE    Kyle Mosley  RXV:[id number]  DOB: [birth year]  DOA: 06/30/2019 PCP: Fayrene Helper, MD   Brief Admission Hx:  84 y.o. male with advanced oxygen and steroid dependent COPD (3LNC), chronic atrial fibrillation/atrial flutter, fully anticoagulated on rivaroxaban, HTN, enlarged prostate, prediabetes and pulmonary hypertension was brought in by wife from home because she was concerned that Kyle Mosley was weak and found to be in Afib with RVR.   MDM/Assessment & Plan:   1. Atrial flutter/fib with rapid ventricular response-Kyle Mosley seems to go in and out of atrial flutter/fibrillation.  This was diagnosed in March 2020.  The Kyle Mosley was not on any AV nodal blockers on admission.  His heart rate was in the 130 range.  He has been started on IV diltiazem and his HR is much better controlled.  Tolerating cardizem CD 120 mg daily.   The Kyle Mosley is fully anticoagulated taking the Xarelto.  The Kyle Mosley is also volume overloaded and he has been started on IV Lasix 20 mg twice daily.  Monitor electrolytes. 2D echo pending.   Potassium has been continued at 40 mEq daily.  Monitor weights and intake and output closely.  Family requesting inpatient cardiology consult and they have seen him today.  2. Advanced COPD-he is oxygen and steroid-dependent.  Given his acute symptoms we have ordered IV Solu-Medrol burst and plan to de-escalate to home prednisone over the next several days.  Schedule duo nebs, continue home bronchodilators.  Antibiotics as ordered.  Supportive therapy.  Continue oxygen with goal of keeping oxygen saturation greater than 87%.  Follow in the stepdown ICU. 3. Generalized weakness- likely multifactorial given his uncontrolled atrial fibrillation/flutter and pneumonia.  Kyle Mosley eval pending.  4. Community-acquired pneumonia-Kyle Mosley has been started on ceftriaxone and doxycycline.  Continue supportive therapy. 5. Prediabetes- he is steroid-dependent.  hemoglobin A1c is 5.5%. 6.  Hyperlipidemia- lipids are very well controlled on home lovastatin/pravastatin. 7. Anemia and chronic disease- Hg slightly down with hydration, following.  8. Chronic insomnia- trazodone ordered and lorazepam ordered to help with sedation as needed.  DVT Prophylaxis: Rivaroxaban Code Status: Full Family Communication: son / telephone Disposition Plan: Inpatient stepdown ICU for IV antibiotics IV steroids and IV diltiazem  Consultants:    Procedures:  2D Echocardiogram (pending)  Antimicrobials:  Ceftriaxone/doxycycline    Subjective: Kyle Mosley says that he is feeling less SOB today.  He has productive cough and chest congestion.   Objective: Vitals:   07/02/19 0507 07/02/19 0815 07/02/19 0823 07/02/19 0914  BP: 113/80   112/82  Pulse: 60     Resp:      Temp: 98.1 F (36.7 C)     TempSrc: Oral     SpO2: 97% 99% 100%   Weight:      Height:        Intake/Output Summary (Last 24 hours) at 07/02/2019 1146 Last data filed at 07/02/2019 0300 Gross per 24 hour  Intake 320.28 ml  Output 300 ml  Net 20.28 ml   Filed Weights   06/30/19 1045 07/02/19 0500  Weight: 73 kg 77.9 kg     REVIEW OF SYSTEMS  As per history otherwise all reviewed and reported negative  Exam:  General exam: elderly chronically ill appearing very fraile male. Lying in bed, NAD.  Respiratory system: rales RLL. No increased work of breathing. Cardiovascular system: irregularly irregular normal S1 & S2 heard.  Gastrointestinal system: Abdomen is nondistended, soft and nontender. Normal bowel sounds heard. Central nervous system: Alert and oriented.  No focal neurological deficits. Extremities: 1+ edema BLEs.  Data Reviewed: Basic Metabolic Panel: Recent Labs  Lab 06/30/19 1114 07/01/19 0517 07/02/19 0610  NA 145 140 138  K 4.9 4.5 4.7  CL 99 94* 92*  CO2 40* 40* 38*  GLUCOSE 106* 142* 101*  BUN 26* 23 22  CREATININE 0.62 0.60* 0.56*  CALCIUM 8.6* 8.2* 8.2*  MG  --  2.1 2.1   Liver  Function Tests: Recent Labs  Lab 07/01/19 0517 07/02/19 0610  AST 17 16  ALT 13 11  ALKPHOS 57 50  BILITOT 0.5 0.3  PROT 5.8* 5.3*  ALBUMIN 2.1* 2.0*   No results for input(s): LIPASE, AMYLASE in the last 168 hours. No results for input(s): AMMONIA in the last 168 hours. CBC: Recent Labs  Lab 06/30/19 1114 07/01/19 0517 07/02/19 0610  WBC 5.4 5.7 9.0  NEUTROABS 4.3 5.5 7.9*  HGB 11.7* 10.9* 11.0*  HCT 41.6 37.9* 37.5*  MCV 103.5* 100.0 98.7  PLT 312 272 273   Cardiac Enzymes: No results for input(s): CKTOTAL, CKMB, CKMBINDEX, TROPONINI in the last 168 hours. CBG (last 3)  No results for input(s): GLUCAP in the last 72 hours. Recent Results (from the past 240 hour(s))  SARS Coronavirus 2 [facility] order, Performed in [facility] hospital lab) Nasopharyngeal Nasopharyngeal Swab     Status: None   Collection Time: 06/30/19 11:06 AM   Specimen: Nasopharyngeal Swab  Result Value Ref Range Status   SARS Coronavirus 2 NEGATIVE NEGATIVE Final    Comment: (NOTE) If result is NEGATIVE SARS-CoV-2 target nucleic acids are NOT DETECTED. The SARS-CoV-2 RNA is generally detectable in upper and lower  respiratory specimens during the acute phase of infection. The lowest  concentration of SARS-CoV-2 viral copies this assay can detect is 250  copies / mL. A negative result does not preclude SARS-CoV-2 infection  and should not be used as the sole basis for treatment or other  Kyle Mosley management decisions.  A negative result may occur with  improper specimen collection / handling, submission of specimen other  than nasopharyngeal swab, presence of viral mutation(s) within the  areas targeted by this assay, and inadequate number of viral copies  (<250 copies / mL). A negative result must be combined with clinical  observations, Kyle Mosley history, and epidemiological information. If result is POSITIVE SARS-CoV-2 target nucleic acids are DETECTED. The SARS-CoV-2 RNA is generally  detectable in upper and lower  respiratory specimens dur ing the acute phase of infection.  Positive  results are indicative of active infection with SARS-CoV-2.  Clinical  correlation with Kyle Mosley history and other diagnostic information is  necessary to determine Kyle Mosley infection status.  Positive results do  not rule out bacterial infection or co-infection with other viruses. If result is PRESUMPTIVE POSTIVE SARS-CoV-2 nucleic acids MAY BE PRESENT.   A presumptive positive result was obtained on the submitted specimen  and confirmed on repeat testing.  While 2019 novel coronavirus  (SARS-CoV-2) nucleic acids may be present in the submitted sample  additional confirmatory testing may be necessary for epidemiological  and / or clinical management purposes  to differentiate between  SARS-CoV-2 and other Sarbecovirus currently known to infect humans.  If clinically indicated additional testing with an alternate test  methodology [phone]) is advised. The SARS-CoV-2 RNA is generally  detectable in upper and lower respiratory sp ecimens during the acute  phase of infection. The expected result is Negative. Fact Sheet for Patients:  StrictlyIdeas.no Fact Sheet for Healthcare Providers: BankingDealers.co.za  This test is not yet approved or cleared by the [place] and has been authorized for detection and/or diagnosis of SARS-CoV-2 by FDA under an Emergency Use Authorization (EUA).  This EUA will remain in effect (meaning this test can be used) for the duration of the COVID-19 declaration under Section 564(b)(1) of the Act, 21 U.S.C. section 360bbb-3(b)(1), unless the authorization is terminated or revoked sooner. Performed at [facility], [address], [place], [place] [address]   Urine culture     Status: Abnormal   Collection Time: 06/30/19 11:06 AM   Specimen: Urine, Clean Catch  Result Value Ref Range Status   Specimen  Description   Final    URINE, CLEAN CATCH Performed at [facility], [address] [place], [place] [address]    Special Requests   Final    NONE Performed at [facility], [address], [place], [place] [address]    Culture (A)  Final    <10,000 COLONIES/mL INSIGNIFICANT GROWTH Performed at [facility] [address] [place], [place] [address]    Report Status 07/01/2019 FINAL  Final     Studies: No results found.   Scheduled Meds: . diltiazem  120 mg Oral Daily  . doxycycline  100 mg Oral Q12H  . feeding supplement (ENSURE ENLIVE)  237 mL Oral BID BM  . furosemide  20 mg Intravenous Daily  . guaiFENesin  1,200 mg Oral BID  . ipratropium-albuterol  3 mL Nebulization TID  . methylPREDNISolone (SOLU-MEDROL) injection  20 mg Intravenous Q24H  . mirtazapine  7.5 mg Oral QHS  . mometasone-formoterol  2 puff Inhalation BID  . pantoprazole (PROTONIX) IV  40 mg Intravenous Q24H  . potassium chloride SA  30 mEq Oral Daily  . pravastatin  20 mg Oral q1800  . rivaroxaban  20 mg Oral Q supper  . sodium chloride flush  3 mL Intravenous Q12H  . tamsulosin  0.4 mg Oral QPC supper   Continuous Infusions: . sodium chloride    . cefTRIAXone (ROCEPHIN)  IV Stopped (07/01/19 1448)    Principal Problem:   Atrial flutter with rapid ventricular response (HCC) Active Problems:   Hyperlipidemia LDL goal <100   COPD (chronic obstructive pulmonary disease) (HCC)   Pulmonary HTN (HCC)   Reduced vision   Hypoxia   CAP (community acquired pneumonia)   Aortic aneurysm (HCC)   Chronic anticoagulation   Insomnia due to medical condition   Atrial fibrillation with tachycardic ventricular rate (HCC)   Supplemental oxygen dependent   Generalized weakness   Prediabetes   Anemia, chronic disease   Elevated brain natriuretic peptide (BNP) level  Time spent: 33 minutes   Irwin Brakeman, MD [facility] 07/02/2019, 11:46 AM    LOS: 2 days  How to contact the [facility] Attending  or Consulting provider [place] or covering provider during after hours [place], for this Kyle Mosley?  1. Check the care team in [facility] and look for a) attending/consulting TRH provider listed and b) the [facility] team listed 2. Log into [email or web address] and use [place]'s universal password to access. If you do not have the password, please contact the hospital operator. 3. Locate the [facility] provider you are looking for under [facility] and page to a number that you can be directly reached. 4. If you still have difficulty reaching the provider, please page the [facility] (Director on Call) for the Hospitalists listed on amion for assistance.

## 2019-07-02 NOTE — Care Management Important Message (Signed)
Important Message  Patient Details  Name: Kyle Mosley MRN: [id number] Date of Birth: September 23, 1935   Medicare Important Message Given:  Yes     Tommy Medal 07/02/2019, 2:36 PM

## 2019-07-02 NOTE — Evaluation (Signed)
Physical Therapy Evaluation Patient Details Name: Kyle Mosley MRN: [id number] DOB: Apr 02, 1935 Today's Date: 07/02/2019   History of Present Illness  HPI: Kyle Mosley is a 84 y.o. male with advanced oxygen and steroid dependent COPD (3LNC), chronic atrial fibrillation/atrial flutter, fully anticoagulated on rivaroxaban, HTN, enlarged prostate, prediabetes and pulmonary hypertension was brought in by wife from home because she was concerned that patient was weak and not getting up and moving about as he usually does in last several days.  He denies chest pain.  He has chronic shortness of breath but not worse than normal. He complains of cough and chest congestion. No known sick contacts and has not had a fever or diarrhea.  Clinical Impression  Kyle Mosley has decreased activity tolerance, becoming SOB with minimal exertion.  He has weakness in his core and LE mm. He will benefit from skilled PT to improve his safety and activity level.     Follow Up Recommendations Home health PT    Equipment Recommendations  None recommended by PT    Recommendations for Other Services       Precautions / Restrictions Precautions Precautions: Other (comment) Precaution Comments: SOB, must go slow to prevent pt from becoming SOB Restrictions Weight Bearing Restrictions: No      Mobility  Bed Mobility Overal bed mobility: Modified Independent             General bed mobility comments: min assist from sidelying to sit.  Transfers Overall transfer level: Needs assistance Equipment used: Rolling walker (2 wheeled) Transfers: Sit to/from [facility] Sit to Stand: Min assist Stand pivot transfers: Supervision          Ambulation/Gait Ambulation/Gait assistance: Min guard Gait Distance (Feet): 5 Feet Assistive device: Rolling walker (2 wheeled) Gait Pattern/deviations: Decreased step length - right;Decreased step length - left Gait velocity: slow                 Pertinent Vitals/Pain Pain Assessment: No/denies pain    Home Living Family/patient expects to be discharged to:: Private residence Living Arrangements: Spouse/significant other Available Help at Discharge: Family;Friend(s) Type of Home: House Home Access: Stairs to enter Entrance Stairs-Rails: Right Entrance Stairs-Number of Steps: 3 Home Layout: One level Home Equipment: Walker - 2 wheels      Prior Function Level of Independence: Independent with assistive device(s)         Comments: For general ADL's     Hand Dominance   Dominant Hand: Right    Extremity/Trunk Assessment   Upper Extremity Assessment Upper Extremity Assessment: Defer to OT evaluation    Lower Extremity Assessment Lower Extremity Assessment: Overall WFL for tasks assessed       Communication   Communication: No difficulties  Cognition Arousal/Alertness: Awake/alert Behavior During Therapy: WFL for tasks assessed/performed Overall Cognitive Status: Within Functional Limits for tasks assessed                                           Exercises General Exercises - Lower Extremity Ankle Circles/Pumps: AROM;10 reps Quad Sets: AROM;10 reps Heel Slides: AROM;10 reps Hip ABduction/ADduction: AROM;10 reps Hip Flexion/Marching: AROM;10 reps;Standing Mini-Sqauts: AROM;10 reps;Both;Standing   Assessment/Plan    PT Assessment Patient needs continued PT services  PT Problem List Decreased strength;Decreased activity tolerance       PT Treatment Interventions Therapeutic activities;Therapeutic exercise;Balance training;Functional mobility training  PT Goals (Current goals can be found in the Care Plan section)  Acute Rehab PT Goals PT Goal Formulation: With patient Time For Goal Achievement: 07/05/19 Potential to Achieve Goals: Good    Frequency Min 3X/week   Barriers to discharge        M-PAC PT "6 Clicks" Mobility  Outcome Measure Help needed turning from  your back to your side while in a flat bed without using bedrails?: A Little Help needed moving from lying on your back to sitting on the side of a flat bed without using bedrails?: A Little Help needed moving to and from a bed to a chair (including a wheelchair)?: A Little Help needed standing up from a chair using your arms (e.g., wheelchair or bedside chair)?: A Little Help needed to walk in hospital room?: A Little Help needed climbing 3-5 steps with a railing? : A Lot 6 Click Score: 17    End of Session Equipment Utilized During Treatment: Gait belt;Oxygen Activity Tolerance: Other (comment)(SOB) Patient left: in chair;with call bell/phone within reach   PT Visit Diagnosis: Unsteadiness on feet (R26.81);Muscle weakness (generalized) (M62.81)    Time: 1093-2355 PT Time Calculation (min) (ACUTE ONLY): 44 min   Charges:   PT Evaluation $PT Eval Low Complexity: [place], PT CLT [phone] 07/02/2019, 12:04 PM

## 2019-07-03 DIAGNOSIS — I272 Pulmonary hypertension, unspecified: Secondary | ICD-10-CM

## 2019-07-03 DIAGNOSIS — G4701 Insomnia due to medical condition: Secondary | ICD-10-CM

## 2019-07-03 DIAGNOSIS — Z9981 Dependence on supplemental oxygen: Secondary | ICD-10-CM

## 2019-07-03 DIAGNOSIS — H547 Unspecified visual loss: Secondary | ICD-10-CM

## 2019-07-03 LAB — CBC WITH DIFFERENTIAL/PLATELET
Abs Immature Granulocytes: 0.03 10*3/uL (ref 0.00–0.07)
Basophils Absolute: 0 10*3/uL (ref 0.0–0.1)
Basophils Relative: 0 %
Eosinophils Absolute: 0 10*3/uL (ref 0.0–0.5)
Eosinophils Relative: 0 %
HCT: 40 % (ref 39.0–52.0)
Hemoglobin: 11.6 g/dL — ABNORMAL LOW (ref 13.0–17.0)
Immature Granulocytes: 0 %
Lymphocytes Relative: 3 %
Lymphs Abs: 0.3 10*3/uL — ABNORMAL LOW (ref 0.7–4.0)
MCH: 28.6 pg (ref 26.0–34.0)
MCHC: 29 g/dL — ABNORMAL LOW (ref 30.0–36.0)
MCV: 98.5 fL (ref 80.0–100.0)
Monocytes Absolute: 0.8 10*3/uL (ref 0.1–1.0)
Monocytes Relative: 9 %
Neutro Abs: 7.8 10*3/uL — ABNORMAL HIGH (ref 1.7–7.7)
Neutrophils Relative %: 88 %
Platelets: 281 10*3/uL (ref 150–400)
RBC: 4.06 MIL/uL — ABNORMAL LOW (ref 4.22–5.81)
RDW: 13.8 % (ref 11.5–15.5)
WBC: 9 10*3/uL (ref 4.0–10.5)
nRBC: 0 % (ref 0.0–0.2)

## 2019-07-03 LAB — BASIC METABOLIC PANEL
Anion gap: 7 (ref 5–15)
BUN: 21 mg/dL (ref 8–23)
CO2: 39 mmol/L — ABNORMAL HIGH (ref 22–32)
Calcium: 8.3 mg/dL — ABNORMAL LOW (ref 8.9–10.3)
Chloride: 93 mmol/L — ABNORMAL LOW (ref 98–111)
Creatinine, Ser: 0.55 mg/dL — ABNORMAL LOW (ref 0.61–1.24)
GFR calc Af Amer: >60 mL/min (ref >60)
GFR calc non Af Amer: >60 mL/min (ref >60)
Glucose, Bld: 99 mg/dL (ref 70–99)
Potassium: 4.8 mmol/L (ref 3.5–5.1)
Sodium: 139 mmol/L (ref 135–145)

## 2019-07-03 LAB — MAGNESIUM: Magnesium: 2 mg/dL (ref 1.7–2.4)

## 2019-07-03 MED ORDER — POTASSIUM CHLORIDE CRYS ER 10 MEQ PO TBCR: 10.0000 meq | EXTENDED_RELEASE_TABLET | Freq: Every day | ORAL | Status: AC

## 2019-07-03 MED ORDER — DILTIAZEM HCL ER COATED BEADS 120 MG PO CP24: 120.0000 mg | ORAL_CAPSULE | Freq: Every day | ORAL | 1 refills | Status: AC

## 2019-07-03 MED ORDER — POTASSIUM CHLORIDE ER 20 MEQ PO TBCR: 10.0000 meq | EXTENDED_RELEASE_TABLET | Freq: Every day | ORAL | Status: DC

## 2019-07-03 MED ORDER — FUROSEMIDE 20 MG PO TABS
20.0000 mg | ORAL_TABLET | Freq: Every day | ORAL | Status: DC
  Filled 2019-07-03: qty 1

## 2019-07-03 MED ORDER — ALBUTEROL SULFATE (2.5 MG/3ML) 0.083% IN NEBU: 2.5000 mg | INHALATION_SOLUTION | Freq: Three times a day (TID) | RESPIRATORY_TRACT | Status: AC

## 2019-07-03 MED ORDER — IPRATROPIUM BROMIDE 0.02 % IN SOLN: 0.5000 mg | Freq: Three times a day (TID) | RESPIRATORY_TRACT | Status: AC

## 2019-07-03 MED ORDER — FUROSEMIDE 40 MG PO TABS: 40.0000 mg | ORAL_TABLET | Freq: Every day | ORAL | 1 refills | Status: DC

## 2019-07-03 MED ORDER — FUROSEMIDE 20 MG PO TABS: 20.0000 mg | ORAL_TABLET | Freq: Every day | ORAL | Status: AC

## 2019-07-03 MED ORDER — FUROSEMIDE 40 MG PO TABS
40.0000 mg | ORAL_TABLET | Freq: Every day | ORAL | Status: DC
  Administered 2019-07-03: 40 mg via ORAL

## 2019-07-03 MED ORDER — POTASSIUM CHLORIDE ER 20 MEQ PO TBCR: 20.0000 meq | EXTENDED_RELEASE_TABLET | Freq: Every day | ORAL | 1 refills | Status: DC

## 2019-07-03 MED ORDER — DOXYCYCLINE HYCLATE 100 MG PO TABS: 100.0000 mg | ORAL_TABLET | Freq: Two times a day (BID) | ORAL | 0 refills | Status: AC

## 2019-07-03 NOTE — Progress Notes (Signed)
Physical Therapy Treatment Patient Details Name: Kyle Mosley MRN: [id number] DOB: 1935-08-05 Today's Date: 07/03/2019    History of Present Illness HPI: CAMBELL STANEK is a 84 y.o. male with advanced oxygen and steroid dependent COPD (3LNC), chronic atrial fibrillation/atrial flutter, fully anticoagulated on rivaroxaban, HTN, enlarged prostate, prediabetes and pulmonary hypertension was brought in by wife from home because she was concerned that patient was weak and not getting up and moving about as he usually does in last several days.  He denies chest pain.  He has chronic shortness of breath but not worse than normal. He complains of cough and chest congestion. No known sick contacts and has not had a fever or diarrhea.    PT Comments    Pt  Progressing well able to complete bed mobility, transfers and gt with Mod I today.  Pt becomes fatigued quickly and needs short breaks between tasks.   Follow Up Recommendations  Home health PT     Equipment Recommendations  None recommended by PT    Recommendations for Other Services       Precautions / Restrictions Precautions Precautions: Other (comment) Precaution Comments: SOB, must go slow to prevent pt from becoming SOB Restrictions Weight Bearing Restrictions: No    Mobility  Bed Mobility Overal bed mobility: Modified Independent             General bed mobility comments: supervision assist from sidelying to sit.  Transfers Overall transfer level: Modified independent Equipment used: Rolling walker (2 wheeled) Transfers: Sit to/from [facility] Sit to Stand: Supervision Stand pivot transfers: Supervision          Ambulation/Gait Ambulation/Gait assistance: Supervision Gait Distance (Feet): 10 Feet Assistive device: Rolling walker (2 wheeled) Gait Pattern/deviations: Decreased step length - right;Decreased step length - left Gait velocity: slow      There ex:  10 reps each ankle pump, heel  slides, glut sets, hip abduction, sitting LAQ,  Standing squats and marching        Cognition Arousal/Alertness: Awake/alert Behavior During Therapy: WFL for tasks assessed/performed Overall Cognitive Status: Within Functional Limits for tasks assessed                   Exercises General Exercises - Lower Extremity Ankle Circles/Pumps: AROM;10 reps Quad Sets: AROM;10 reps Gluteal Sets: 10 reps Heel Slides: AROM;10 reps Hip ABduction/ADduction: AROM;10 reps Hip Flexion/Marching: AROM;10 reps;Standing Mini-Sqauts: AROM;10 reps;Both;Standing    General Comments        Pertinent Vitals/Pain Pain Assessment: No/denies pain    Home Living  Lives with wife at home.                    Prior Function   Household ambulation with RW and O2         PT Goals (current goals can now be found in the care plan section) Acute Rehab PT Goals PT Goal Formulation: With patient Potential to Achieve Goals: Good Progress towards PT goals: Progressing toward goals    Frequency    Min 3X/week      PT Plan  Remains appropriate; HH           End of Session Equipment Utilized During Treatment: Gait belt;Oxygen Activity Tolerance: Other (comment)(SOB) Patient left: in chair;with call bell/phone within reach   PT Visit Diagnosis: Unsteadiness on feet (R26.81);Muscle weakness (generalized) (M62.81)     Time: 1030-1107 PT Time Calculation (min) (ACUTE ONLY): 37 min  Charges:  $Gait Training: 8-22 mins $  Therapeutic Exercise: 8-22 mins                        Rayetta Humphrey, PT CLT [phone] 07/03/2019, 11:11 AM

## 2019-07-03 NOTE — TOC Transition Note (Signed)
Transition of Care [facility]) - CM/SW Discharge Note   Patient Details  Name: Kyle Mosley MRN: [id number] Date of Birth: [birth year]  Transition of Care [facility]) CM/SW Contact:  Boneta Lucks, RN Phone Number: 07/03/2019, 1:29 PM   Clinical Narrative:  Patient admitted for Atrial flutter with RVR. Patient lives at home with his wife. Uses home 02. PT recommending HHPT.  Spoke with wife she has not preference.  MD ordered home health PT/RN/SW. Romualdo Bolk with [facility] took the referral and aware of discharge.      Final next level of care: [place] Barriers to Discharge: No Barriers Identified   Patient Goals and CMS Choice Patient states their goals for this hospitalization and ongoing recovery are:: to go home CMS Medicare.gov Compare Post Acute Care list provided to:: Patient Represenative (must comment)(wife) Choice offered to / list presented to : Spouse   Discharge Plan and Services In-house Referral: Clinical Social Work Discharge Planning Services: CM Consult Post Acute Care Choice: Home Health                    HH Arranged: RN, PT, Social Work [facility] Agency: [place] ([place]) Date [place]: 07/03/19 Time [place]: 1128 Representative spoke with at [place]: [place] ([place]) Interventions     Readmission Risk Interventions No flowsheet data found.

## 2019-07-03 NOTE — Discharge Summary (Signed)
Physician Discharge Summary  ABDIRAHIM FLAVELL WPY:[id number] DOB: [birth year] DOA: 06/30/2019  PCP: Fayrene Helper, MD Cardiologist: Bronson Ing  Admit date: 06/30/2019 Discharge date: 07/03/2019  Admitted From: Home  Disposition: Home with [facility] services   Recommendations for Outpatient Follow-up:  1. Follow up with PCP in 1 weeks 2. Follow up with cardiology in Sept as scheduled   Home Health: PT, RN  Discharge Condition: STABLE  CODE STATUS: FULL    Brief Hospitalization Summary: Please see all hospital notes, images, labs for full details of the hospitalization.  HPI: Kyle Mosley is a 84 y.o. male with advanced oxygen and steroid dependent COPD (3LNC), chronic atrial fibrillation/atrial flutter, fully anticoagulated on rivaroxaban, HTN, enlarged prostate, prediabetes and pulmonary hypertension was brought in by wife from home because she was concerned that patient was weak and not getting up and moving about as he usually does in last several days.  He denies chest pain.  He has chronic shortness of breath but not worse than normal. He complains of cough and chest congestion. No known sick contacts and has not had a fever or diarrhea.  He denies abdominal pain.  He is visually impaired.  He reports that he has been taking his medications as prescribed.  He has urinating.  He denies diarrhea.    ED Course: The patient was noted to have an elevated cardiac BNP.  His heart rate was elevated in the 120-130 range.  His high-sensitivity troponin was 6.  He was afebrile.  His blood pressure was normal.  The patient had a chest x-ray with findings of suspicious infiltrate in the right lower lobe.  The patient was noted to be wheezing and coughing on physical exam and given nebulizer treatments.  His COVID 19 test was negative.  His urinalysis was unremarkable.  His EKG showed findings of rapid atrial flutter similar to recent EKG from March 2020.  The patient was started on antibiotics and started on  diltiazem for assistance with rate control and admission was requested.  Brief Admission Hx: 84 y.o.malewith advanced oxygen and steroid dependent COPD (3LNC), chronic atrial fibrillation/atrial flutter, fully anticoagulated on rivaroxaban, HTN, enlarged prostate, prediabetes and pulmonary hypertension was brought in by wife from home because she was concerned that patient was weak and found to be in Afib with RVR.   MDM/Assessment & Plan:   1. Atrial flutter/fib with rapid ventricular response-CONTROLLED on oral medications.  Patient seems was having intermittent atrial flutter/fibrillation. This was diagnosed in March 2020. The patient was not on any AV nodal blockers on admission. His heart rate was in the 130 range. He has been started on IV diltiazem and his HR is much better controlled. Tolerating cardizem CD 120 mg daily.  The patient is fully anticoagulated taking the Xarelto. The patient had mild volume overload and was started on IV Lasix 20 mg twice daily. He has diuresed well.    Family requesting inpatient cardiology consult and they have seen him today.   He will discharge home on lasix 20 mg daily, KCL 10 meq daily.   2. Acute diastolic CHF exacerbation - treated with IV lasix, diuresed well, resume home lasix 20 mg daily at discharge.  3. Advanced COPD-he is oxygen dependent. Resume home bronchodilators.  Continue oxygen with goal of keeping oxygen saturation greater than 87%.  4. Generalized weakness-likely multifactorial given his uncontrolled atrial fibrillation/flutter and pneumonia. PT eval recommending HHPT.  5. Community-acquired pneumonia-patient has been started on ceftriaxone and doxycycline. Continue doxycycline x  4 days. 6. Prediabetes by history -hemoglobin A1c is 5.5%. 7. Hyperlipidemia-lipids are very well controlled on home lovastatin/pravastatin. 8. Anemia and chronic disease-Hg slightly down with hydration, following.  9. Chronic  insomnia-trazodone ordered and lorazepam ordered to help with sedation as needed.  2D Echocardiogram IMPRESSIONS  1. The left ventricle has normal systolic function with an ejection fraction of 60-65%. The cavity size was normal. There is mildly increased left ventricular wall thickness. Left ventricular diastolic function could not be evaluated secondary to atrial  flutter. No evidence of left ventricular regional wall motion abnormalities.  2. The right ventricle has mildly reduced systolic function. The cavity was normal. There is no increase in right ventricular wall thickness. Right ventricular systolic pressure is mildly elevated.  3. The aortic valve is tricuspid. Mild sclerosis of the aortic valve. Aortic valve regurgitation was not assessed by color flow Doppler.  4. Right atrial size was mildly dilated.  5. Left atrial size was mildly dilated.  6. The inferior vena cava was dilated in size with >50% respiratory variability.  7. The aorta is normal in size and structure.  DVT Prophylaxis:Rivaroxaban Code Status:Full Family Communication:son / telephone Disposition Plan:Inpatient stepdown ICU for IV antibiotics IV steroids and IV diltiazem  Discharge Diagnoses:  Principal Problem:   Atrial flutter with rapid ventricular response (HCC) Active Problems:   Hyperlipidemia LDL goal <100   COPD (chronic obstructive pulmonary disease) ([place])   Pulmonary HTN (HCC)   Reduced vision   Hypoxia   CAP (community acquired pneumonia)   Aortic aneurysm (HCC)   Chronic anticoagulation   Insomnia due to medical condition   Atrial fibrillation with tachycardic ventricular rate (HCC)   Supplemental oxygen dependent   Generalized weakness   Prediabetes   Anemia, chronic disease   Elevated brain natriuretic peptide (BNP) level   Discharge Instructions: Discharge Instructions    (HEART FAILURE PATIENTS) Call MD:  Anytime you have any of the following symptoms: 1) 3 pound weight gain  in 24 hours or 5 pounds in 1 week 2) shortness of breath, with or without a dry hacking cough 3) swelling in the hands, feet or stomach 4) if you have to sleep on extra pillows at night in order to breathe.   Complete by: As directed    Call MD for:  difficulty breathing, headache or visual disturbances   Complete by: As directed    Call MD for:  extreme fatigue   Complete by: As directed    Call MD for:  persistant dizziness or light-headedness   Complete by: As directed    Call MD for:  persistant nausea and vomiting   Complete by: As directed    Call MD for:  redness, tenderness, or signs of infection (pain, swelling, redness, odor or green/yellow discharge around incision site)   Complete by: As directed    Call MD for:  severe uncontrolled pain   Complete by: As directed    Diet - low sodium heart healthy   Complete by: As directed    Increase activity slowly   Complete by: As directed      Allergies as of 07/03/2019   No Known Allergies     Medication List    STOP taking these medications   potassium chloride 10 MEQ tablet Commonly known as: K-DUR     TAKE these medications   albuterol 108 (90 Base) MCG/ACT inhaler Commonly known as: Ventolin HFA Inhale 2 puffs into the lungs every 6 (six) hours as needed for  wheezing or shortness of breath.   albuterol (2.5 MG/3ML) 0.083% nebulizer solution Commonly known as: PROVENTIL Take 3 mLs (2.5 mg total) by nebulization 3 (three) times daily.   diltiazem 120 MG 24 hr capsule Commonly known as: CARDIZEM CD Take 1 capsule (120 mg total) by mouth daily.   doxycycline 100 MG tablet Commonly known as: VIBRA-TABS Take 1 tablet (100 mg total) by mouth every 12 (twelve) hours for 4 days.   Fluticasone-Salmeterol 250-50 MCG/DOSE Aepb Commonly known as: Advair Diskus Inhale 1 puff into the lungs 2 (two) times daily.   furosemide 20 MG tablet Commonly known as: LASIX Take 1 tablet (20 mg total) by mouth daily.   ipratropium  0.02 % nebulizer solution Commonly known as: ATROVENT Take 2.5 mLs (0.5 mg total) by nebulization 3 (three) times daily.   lovastatin 10 MG tablet Commonly known as: MEVACOR TAKE 1 TABLET AT BEDTIME   mirtazapine 7.5 MG tablet Commonly known as: REMERON TAKE 1 TABLET AT BEDTIME   pantoprazole 20 MG tablet Commonly known as: PROTONIX Take 1 tablet (20 mg total) by mouth daily.   potassium chloride 10 MEQ tablet Commonly known as: K-DUR Take 1 tablet (10 mEq total) by mouth daily.   rivaroxaban 20 MG Tabs tablet Commonly known as: XARELTO Take 1 tablet (20 mg total) by mouth daily with supper.   tamsulosin 0.4 MG Caps capsule Commonly known as: FLOMAX TAKE 1 CAPSULE  DAILY AFTER SUPPER.      Follow-up Information    Herminio Commons, MD On 08/14/2019.   Specialty: Cardiology Why: at 10:00 am. Virtual OV (telephone visit) Contact information: [place] [address] [phone]        Fayrene Helper, MD. Schedule an appointment as soon as possible for a visit in 1 week(s).   Specialty: Family Medicine Why: Hospital Follow Up  Contact information: [place] [address] [phone]          No Known Allergies Allergies as of 07/03/2019   No Known Allergies     Medication List    STOP taking these medications   potassium chloride 10 MEQ tablet Commonly known as: K-DUR     TAKE these medications   albuterol 108 (90 Base) MCG/ACT inhaler Commonly known as: Ventolin HFA Inhale 2 puffs into the lungs every 6 (six) hours as needed for wheezing or shortness of breath.   albuterol (2.5 MG/3ML) 0.083% nebulizer solution Commonly known as: PROVENTIL Take 3 mLs (2.5 mg total) by nebulization 3 (three) times daily.   diltiazem 120 MG 24 hr capsule Commonly known as: CARDIZEM CD Take 1 capsule (120 mg total) by mouth daily.   doxycycline 100 MG tablet Commonly known as: VIBRA-TABS Take 1 tablet (100 mg total)  by mouth every 12 (twelve) hours for 4 days.   Fluticasone-Salmeterol 250-50 MCG/DOSE Aepb Commonly known as: Advair Diskus Inhale 1 puff into the lungs 2 (two) times daily.   furosemide 20 MG tablet Commonly known as: LASIX Take 1 tablet (20 mg total) by mouth daily.   ipratropium 0.02 % nebulizer solution Commonly known as: ATROVENT Take 2.5 mLs (0.5 mg total) by nebulization 3 (three) times daily.   lovastatin 10 MG tablet Commonly known as: MEVACOR TAKE 1 TABLET AT BEDTIME   mirtazapine 7.5 MG tablet Commonly known as: REMERON TAKE 1 TABLET AT BEDTIME   pantoprazole 20 MG tablet Commonly known as: PROTONIX Take 1 tablet (20 mg total) by mouth daily.  potassium chloride 10 MEQ tablet Commonly known as: K-DUR Take 1 tablet (10 mEq total) by mouth daily.   rivaroxaban 20 MG Tabs tablet Commonly known as: XARELTO Take 1 tablet (20 mg total) by mouth daily with supper.   tamsulosin 0.4 MG Caps capsule Commonly known as: FLOMAX TAKE 1 CAPSULE  DAILY AFTER SUPPER.       Procedures/Studies: Dg Chest Port 1 View  Result Date: 06/30/2019 CLINICAL DATA:  Shortness of breath wheezing. EXAM: PORTABLE CHEST 1 VIEW COMPARISON:  02/18/2019 FINDINGS: Advanced emphysematous lung disease again noted. There is suggestion of potential acute infiltrate at the right lung base. No overt edema or pleural fluid. No pneumothorax. Stable heart size. IMPRESSION: Advanced emphysema with potential acute infiltrate at the right lung base. Electronically Signed   By: Aletta Edouard M.D.   On: 06/30/2019 12:05      Subjective: Pt says he feels much better.  He wants to go home.  He has no complaints.    Discharge Exam: Vitals:   07/03/19 0801 07/03/19 0805  BP:    Pulse:    Resp:    Temp:    SpO2: 100% 100%   Vitals:   07/02/19 2120 07/03/19 0517 07/03/19 0801 07/03/19 0805  BP: 105/72 (!) 125/92    Pulse: 98 80    Resp: 18 17    Temp: 98.2 F (36.8 C) 98 F (36.7 C)     TempSrc: Oral Oral    SpO2: 100% 100% 100% 100%  Weight:  76.4 kg    Height:        General: Pt is alert, awake, not in acute distress Cardiovascular: irregularly irreg S1/S2 +, no rubs, no gallops Respiratory: CTA bilaterally, no wheezing, no rhonchi Abdominal: Soft, NT, ND, bowel sounds + Extremities: 1+ pitting edema BLEs, no cyanosis   The results of significant diagnostics from this hospitalization (including imaging, microbiology, ancillary and laboratory) are listed below for reference.     Microbiology: Recent Results (from the past 240 hour(s))  SARS Coronavirus 2 [facility] order, Performed in [facility] hospital lab) Nasopharyngeal Nasopharyngeal Swab     Status: None   Collection Time: 06/30/19 11:06 AM   Specimen: Nasopharyngeal Swab  Result Value Ref Range Status   SARS Coronavirus 2 NEGATIVE NEGATIVE Final    Comment: (NOTE) If result is NEGATIVE SARS-CoV-2 target nucleic acids are NOT DETECTED. The SARS-CoV-2 RNA is generally detectable in upper and lower  respiratory specimens during the acute phase of infection. The lowest  concentration of SARS-CoV-2 viral copies this assay can detect is 250  copies / mL. A negative result does not preclude SARS-CoV-2 infection  and should not be used as the sole basis for treatment or other  patient management decisions.  A negative result may occur with  improper specimen collection / handling, submission of specimen other  than nasopharyngeal swab, presence of viral mutation(s) within the  areas targeted by this assay, and inadequate number of viral copies  (<250 copies / mL). A negative result must be combined with clinical  observations, patient history, and epidemiological information. If result is POSITIVE SARS-CoV-2 target nucleic acids are DETECTED. The SARS-CoV-2 RNA is generally detectable in upper and lower  respiratory specimens dur ing the acute phase of infection.  Positive  results are indicative of active  infection with SARS-CoV-2.  Clinical  correlation with patient history and other diagnostic information is  necessary to determine patient infection status.  Positive results do  not rule out bacterial  infection or co-infection with other viruses. If result is PRESUMPTIVE POSTIVE SARS-CoV-2 nucleic acids MAY BE PRESENT.   A presumptive positive result was obtained on the submitted specimen  and confirmed on repeat testing.  While 2019 novel coronavirus  (SARS-CoV-2) nucleic acids may be present in the submitted sample  additional confirmatory testing may be necessary for epidemiological  and / or clinical management purposes  to differentiate between  SARS-CoV-2 and other Sarbecovirus currently known to infect humans.  If clinically indicated additional testing with an alternate test  methodology [id number]) is advised. The SARS-CoV-2 RNA is generally  detectable in upper and lower respiratory sp ecimens during the acute  phase of infection. The expected result is Negative. Fact Sheet for Patients:  StrictlyIdeas.no Fact Sheet for Healthcare Providers: BankingDealers.co.za This test is not yet approved or cleared by the [place] FDA and has been authorized for detection and/or diagnosis of SARS-CoV-2 by FDA under an Emergency Use Authorization (EUA).  This EUA will remain in effect (meaning this test can be used) for the duration of the COVID-19 declaration under Section 564(b)(1) of the Act, 21 U.S.C. section 360bbb-3(b)(1), unless the authorization is terminated or revoked sooner. Performed at [facility], [address], [place], [place] [address]   Urine culture     Status: Abnormal   Collection Time: 06/30/19 11:06 AM   Specimen: Urine, Clean Catch  Result Value Ref Range Status   Specimen Description   Final    URINE, CLEAN CATCH Performed at [facility] [address], [place], [place] [address]    Special Requests   Final     NONE Performed at [facility], [address] [place], [place] [address]    Culture (A)  Final    <10,000 COLONIES/mL INSIGNIFICANT GROWTH Performed at [facility] [address], [place], [place] [address]    Report Status 07/01/2019 FINAL  Final     Labs: BNP (last 3 results) Recent Labs    02/18/19 1210 06/30/19 1114  BNP 182.0* 119.4*   Basic Metabolic Panel: Recent Labs  Lab 06/30/19 1114 07/01/19 0517 07/02/19 0610 07/03/19 0614  NA 145 140 138 139  K 4.9 4.5 4.7 4.8  CL 99 94* 92* 93*  CO2 40* 40* 38* 39*  GLUCOSE 106* 142* 101* 99  BUN 26* 23 22 21   CREATININE 0.62 0.60* 0.56* 0.55*  CALCIUM 8.6* 8.2* 8.2* 8.3*  MG  --  2.1 2.1 2.0   Liver Function Tests: Recent Labs  Lab 07/01/19 0517 07/02/19 0610  AST 17 16  ALT 13 11  ALKPHOS 57 50  BILITOT 0.5 0.3  PROT 5.8* 5.3*  ALBUMIN 2.1* 2.0*   No results for input(s): LIPASE, AMYLASE in the last 168 hours. No results for input(s): AMMONIA in the last 168 hours. CBC: Recent Labs  Lab 06/30/19 1114 07/01/19 0517 07/02/19 0610 07/03/19 0614  WBC 5.4 5.7 9.0 9.0  NEUTROABS 4.3 5.5 7.9* 7.8*  HGB 11.7* 10.9* 11.0* 11.6*  HCT 41.6 37.9* 37.5* 40.0  MCV 103.5* 100.0 98.7 98.5  PLT 312 272 273 281   Cardiac Enzymes: No results for input(s): CKTOTAL, CKMB, CKMBINDEX, TROPONINI in the last 168 hours. BNP: Invalid input(s): POCBNP CBG: No results for input(s): GLUCAP in the last 168 hours. D-Dimer No results for input(s): DDIMER in the last 72 hours. Hgb A1c Recent Labs    06/30/19 1331  HGBA1C 5.5   Lipid Profile Recent Labs    06/30/19 1332  CHOL 120  HDL 60  LDLCALC 53  TRIG 33  CHOLHDL 2.0   Thyroid function studies Recent Labs    06/30/19 1331  TSH 4.448   Anemia work up No results for input(s): VITAMINB12, FOLATE, FERRITIN, TIBC, IRON, RETICCTPCT in the last 72 hours. Urinalysis    Component Value Date/Time   COLORURINE AMBER (A) 06/30/2019 1106    APPEARANCEUR CLEAR 06/30/2019 1106   LABSPEC 1.024 06/30/2019 1106   PHURINE 5.0 06/30/2019 1106   GLUCOSEU NEGATIVE 06/30/2019 1106   HGBUR NEGATIVE 06/30/2019 1106   [place] 06/30/2019 1106   [place] 06/30/2019 1106   PROTEINUR NEGATIVE 06/30/2019 1106   NITRITE NEGATIVE 06/30/2019 1106   LEUKOCYTESUR NEGATIVE 06/30/2019 1106   Sepsis Labs Invalid input(s): PROCALCITONIN,  WBC,  LACTICIDVEN Microbiology Recent Results (from the past 240 hour(s))  SARS Coronavirus 2 [facility] order, Performed in [facility] hospital lab) Nasopharyngeal Nasopharyngeal Swab     Status: None   Collection Time: 06/30/19 11:06 AM   Specimen: Nasopharyngeal Swab  Result Value Ref Range Status   SARS Coronavirus 2 NEGATIVE NEGATIVE Final    Comment: (NOTE) If result is NEGATIVE SARS-CoV-2 target nucleic acids are NOT DETECTED. The SARS-CoV-2 RNA is generally detectable in upper and lower  respiratory specimens during the acute phase of infection. The lowest  concentration of SARS-CoV-2 viral copies this assay can detect is 250  copies / mL. A negative result does not preclude SARS-CoV-2 infection  and should not be used as the sole basis for treatment or other  patient management decisions.  A negative result may occur with  improper specimen collection / handling, submission of specimen other  than nasopharyngeal swab, presence of viral mutation(s) within the  areas targeted by this assay, and inadequate number of viral copies  (<250 copies / mL). A negative result must be combined with clinical  observations, patient history, and epidemiological information. If result is POSITIVE SARS-CoV-2 target nucleic acids are DETECTED. The SARS-CoV-2 RNA is generally detectable in upper and lower  respiratory specimens dur ing the acute phase of infection.  Positive  results are indicative of active infection with SARS-CoV-2.  Clinical  correlation with patient history and other  diagnostic information is  necessary to determine patient infection status.  Positive results do  not rule out bacterial infection or co-infection with other viruses. If result is PRESUMPTIVE POSTIVE SARS-CoV-2 nucleic acids MAY BE PRESENT.   A presumptive positive result was obtained on the submitted specimen  and confirmed on repeat testing.  While 2019 novel coronavirus  (SARS-CoV-2) nucleic acids may be present in the submitted sample  additional confirmatory testing may be necessary for epidemiological  and / or clinical management purposes  to differentiate between  SARS-CoV-2 and other Sarbecovirus currently known to infect humans.  If clinically indicated additional testing with an alternate test  methodology [phone]) is advised. The SARS-CoV-2 RNA is generally  detectable in upper and lower respiratory sp ecimens during the acute  phase of infection. The expected result is Negative. Fact Sheet for Patients:  StrictlyIdeas.no Fact Sheet for Healthcare Providers: BankingDealers.co.za This test is not yet approved or cleared by the [place] FDA and has been authorized for detection and/or diagnosis of SARS-CoV-2 by FDA under an Emergency Use Authorization (EUA).  This EUA will remain in effect (meaning this test can be used) for the duration of the COVID-19 declaration under Section 564(b)(1) of the Act, 21 U.S.C. section 360bbb-3(b)(1), unless the authorization is terminated or revoked sooner. Performed at  [facility], [address], [place], [place] [address]   Urine culture     Status: Abnormal   Collection Time: 06/30/19 11:06 AM   Specimen: Urine, Clean Catch  Result Value Ref Range Status   Specimen Description   Final    URINE, CLEAN CATCH Performed at [facility], [address] [place], [place] [address]    Special Requests   Final    NONE Performed at [facility], [address] [place], [place] [address]     Culture (A)  Final    <10,000 COLONIES/mL INSIGNIFICANT GROWTH Performed at [facility] [address] [place], [place] [address]    Report Status 07/01/2019 FINAL  Final   Time coordinating discharge:   SIGNED:  Irwin Brakeman, MD  [facility] 07/03/2019, 11:45 AM How to contact the [facility] Attending or Consulting provider [place] or covering provider during after hours [place], for this patient?  1. Check the care team in [facility] and look for a) attending/consulting [facility] provider listed and b) the [facility] team listed 2. Log into [email or web address] and use [facility]'s universal password to access. If you do not have the password, please contact the hospital operator. 3. Locate the [facility] provider you are looking for under [facility] and page to a number that you can be directly reached. 4. If you still have difficulty reaching the provider, please page the [facility] (Director on Call) for the Hospitalists listed on amion for assistance.

## 2019-07-03 NOTE — Discharge Instructions (Signed)
Atrial Fibrillation ° °Atrial fibrillation is a type of heartbeat that is irregular or fast (rapid). If you have this condition, your heart beats without any order. This makes it hard for your heart to pump blood in a normal way. Having this condition gives you more risk for stroke, heart failure, and other heart problems. °Atrial fibrillation may start all of a sudden and then stop on its own, or it may become a long-lasting problem. °What are the causes? °This condition may be caused by heart conditions, such as: °· High blood pressure. °· Heart failure. °· Heart valve disease. °· Heart surgery. °Other causes include: °· Pneumonia. °· Obstructive sleep apnea. °· Lung cancer. °· Thyroid disease. °· Drinking too much alcohol. °Sometimes the cause is not known. °What increases the risk? °You are more likely to develop this condition if: °· You smoke. °· You are older. °· You have diabetes. °· You are overweight. °· You have a family history of this condition. °· You exercise often and hard. °What are the signs or symptoms? °Common symptoms of this condition include: °· A feeling like your heart is beating very fast. °· Chest pain. °· Feeling short of breath. °· Feeling light-headed or weak. °· Getting tired easily. °Follow these instructions at home: °Medicines °· Take over-the-counter and prescription medicines only as told by your doctor. °· If your doctor gives you a blood-thinning medicine, take it exactly as told. Taking too much of it can cause bleeding. Taking too little of it does not protect you against clots. Clots can cause a stroke. °Lifestyle ° °  ° °· Do not use any tobacco products. These include cigarettes, chewing tobacco, and e-cigarettes. If you need help quitting, ask your doctor. °· Do not drink alcohol. °· Do not drink beverages that have caffeine. These include coffee, soda, and tea. °· Follow diet instructions as told by your doctor. °· Exercise regularly as told by your doctor. °General  instructions °· If you have a condition that causes breathing to stop for a short period of time (apnea), treat it as told by your doctor. °· Keep a healthy weight. Do not use diet pills unless your doctor says they are safe for you. Diet pills may make heart problems worse. °· Keep all follow-up visits as told by your doctor. This is important. °Contact a doctor if: °· You notice a change in the speed, rhythm, or strength of your heartbeat. °· You are taking a blood-thinning medicine and you see more bruising. °· You get tired more easily when you move or exercise. °· You have a sudden change in weight. °Get help right away if: ° °· You have pain in your chest or your belly (abdomen). °· You have trouble breathing. °· You have blood in your vomit, poop, or pee (urine). °· You have any signs of a stroke. "BE FAST" is an easy way to remember the main warning signs: °? B - Balance. Signs are dizziness, sudden trouble walking, or loss of balance. °? E - Eyes. Signs are trouble seeing or a change in how you see. °? F - Face. Signs are sudden weakness or loss of feeling in the face, or the face or eyelid drooping on one side. °? A - Arms. Signs are weakness or loss of feeling in an arm. This happens suddenly and usually on one side of the body. °? S - Speech. Signs are sudden trouble speaking, slurred speech, or trouble understanding what people say. °? T - Time.   Time to call emergency services. Write down what time symptoms started.  You have other signs of a stroke, such as: ? A sudden, very bad headache with no known cause. ? Feeling sick to your stomach (nausea). ? Throwing up (vomiting). ? Jerky movements you cannot control (seizure). These symptoms may be an emergency. Do not wait to see if the symptoms will go away. Get medical help right away. Call your local emergency services (911 in the [place] Do not drive yourself to the hospital. Summary  Atrial fibrillation is a type of heartbeat that is irregular  or fast (rapid).  You are at higher risk of this condition if you smoke, are older, have diabetes, or are overweight.  Follow your doctor's instructions about medicines, diet, exercise, and follow-up visits.  Get help right away if you think that you have signs of a stroke. This information is not intended to replace advice given to you by your health care provider. Make sure you discuss any questions you have with your health care provider. Document Released: 08/17/2008 Document Revised: 01/12/2018 Document Reviewed: 12/30/2017 Elsevier Patient Education  [place] Pneumonia, Adult Pneumonia is an infection of the lungs. It causes swelling in the airways of the lungs. Mucus and fluid may also build up inside the airways. One type of pneumonia can happen while a person is in a hospital. A different type can happen when a person is not in a hospital (community-acquired pneumonia).  What are the causes?  This condition is caused by germs (viruses, bacteria, or fungi). Some types of germs can be passed from one person to another. This can happen when you breathe in droplets from the cough or sneeze of an infected person. What increases the risk? You are more likely to develop this condition if you:  Have a long-term (chronic) disease, such as: ? Chronic obstructive pulmonary disease (COPD). ? Asthma. ? Cystic fibrosis. ? Congestive heart failure. ? Diabetes. ? Kidney disease.  Have HIV.  Have sickle cell disease.  Have had your spleen removed.  Do not take good care of your teeth and mouth (poor dental hygiene).  Have a medical condition that increases the risk of breathing in droplets from your own mouth and nose.  Have a weakened body defense system (immune system).  Are a smoker.  Travel to areas where the germs that cause this illness are common.  Are around certain animals or the places they live. What are the signs or  symptoms?  A dry cough.  A wet (productive) cough.  Fever.  Sweating.  Chest pain. This often happens when breathing deeply or coughing.  Fast breathing or trouble breathing.  Shortness of breath.  Shaking chills.  Feeling tired (fatigue).  Muscle aches. How is this treated? Treatment for this condition depends on many things. Most adults can be treated at home. In some cases, treatment must happen in a hospital. Treatment may include:  Medicines given by mouth or through an IV tube.  Being given extra oxygen.  Respiratory therapy. In rare cases, treatment for very bad pneumonia may include:  Using a machine to help you breathe.  Having a procedure to remove fluid from around your lungs. Follow these instructions at home: Medicines  Take over-the-counter and prescription medicines only as told by your doctor. ? Only take cough medicine if you are losing sleep.  If you were prescribed an antibiotic medicine, take it as told by your doctor. Do not  stop taking the antibiotic even if you start to feel better. General instructions   Sleep with your head and neck raised (elevated). You can do this by sleeping in a recliner or by putting a few pillows under your head.  Rest as needed. Get at least 8 hours of sleep each night.  Drink enough water to keep your pee (urine) pale yellow.  Eat a healthy diet that includes plenty of vegetables, fruits, whole grains, low-fat dairy products, and lean protein.  Do not use any products that contain nicotine or tobacco. These include cigarettes, e-cigarettes, and chewing tobacco. If you need help quitting, ask your doctor.  Keep all follow-up visits as told by your doctor. This is important. How is this prevented? A shot (vaccine) can help prevent pneumonia. Shots are often suggested for:  People older than 82 years of age.  People older than 82 years of age who: ? Are having cancer treatment. ? Have long-term (chronic)  lung disease. ? Have problems with their body's defense system. You may also prevent pneumonia if you take these actions:  Get the flu (influenza) shot every year.  Go to the dentist as often as told.  Wash your hands often. If you cannot use soap and water, use hand sanitizer. Contact a doctor if:  You have a fever.  You lose sleep because your cough medicine does not help. Get help right away if:  You are short of breath and it gets worse.  You have more chest pain.  Your sickness gets worse. This is very serious if: ? You are an older adult. ? Your body's defense system is weak.  You cough up blood. Summary  Pneumonia is an infection of the lungs.  Most adults can be treated at home. Some will need treatment in a hospital.  Drink enough water to keep your pee pale yellow.  Get at least 8 hours of sleep each night. This information is not intended to replace advice given to you by your health care provider. Make sure you discuss any questions you have with your health care provider. Document Released: 04/26/2008 Document Revised: 02/28/2019 Document Reviewed: 07/06/2018 Elsevier Patient Education  [place].   IMPORTANT INFORMATION: PAY CLOSE ATTENTION   PHYSICIAN DISCHARGE INSTRUCTIONS  Follow with Primary care provider  Fayrene Helper, MD  and other consultants as instructed by your Hospitalist Physician  [place] IF SYMPTOMS COME BACK, WORSEN OR NEW PROBLEM DEVELOPS   Please note: You were cared for by a hospitalist during your hospital stay. Every effort will be made to forward records to your primary care provider.  You can request that your primary care provider send for your hospital records if they have not received them.  Once you are discharged, your primary care physician will handle any further medical issues. Please note that NO REFILLS for any discharge medications will be authorized once you are  discharged, as it is imperative that you return to your primary care physician (or establish a relationship with a primary care physician if you do not have one) for your post hospital discharge needs so that they can reassess your need for medications and monitor your lab values.  Please get a complete blood count and chemistry panel checked by your Primary MD at your next visit, and again as instructed by your Primary MD.  Get Medicines reviewed and adjusted: Please take all your medications with you for your next visit with your  Primary MD  Laboratory/radiological data: Please request your Primary MD to go over all hospital tests and procedure/radiological results at the follow up, please ask your primary care provider to get all Hospital records sent to his/her office.  In some cases, they will be blood work, cultures and biopsy results pending at the time of your discharge. Please request that your primary care provider follow up on these results.  If you are diabetic, please bring your blood sugar readings with you to your follow up appointment with primary care.    Please call and make your follow up appointments as soon as possible.    Also Note the following: If you experience worsening of your admission symptoms, develop shortness of breath, life threatening emergency, suicidal or homicidal thoughts you must seek medical attention immediately by calling 911 or calling your MD immediately  if symptoms less severe.  You must read complete instructions/literature along with all the possible adverse reactions/side effects for all the Medicines you take and that have been prescribed to you. Take any new Medicines after you have completely understood and accpet all the possible adverse reactions/side effects.   Do not drive when taking Pain medications or sleeping medications (Benzodiazepines)  Do not take more than prescribed Pain, Sleep and Anxiety Medications. It is not advisable to  combine anxiety,sleep and pain medications without talking with your primary care practitioner  Special Instructions: If you have smoked or chewed Tobacco  in the last 2 yrs please stop smoking, stop any regular Alcohol  and or any Recreational drug use.  Wear Seat belts while driving.  Do not drive if taking any narcotic, mind altering or controlled substances or recreational drugs or alcohol.

## 2019-07-03 NOTE — Progress Notes (Signed)
Nsg Discharge Note  Admit Date:  06/30/2019 Discharge date: 07/03/2019   Charolotte Capuchin to be D/C'd Home  per MD order.  AVS completed.   Patient/caregiver able to verbalize understanding.  Discharge Medication: Allergies as of 07/03/2019   No Known Allergies     Medication List    STOP taking these medications   potassium chloride 10 MEQ tablet Commonly known as: K-DUR     TAKE these medications   albuterol 108 (90 Base) MCG/ACT inhaler Commonly known as: Ventolin HFA Inhale 2 puffs into the lungs every 6 (six) hours as needed for wheezing or shortness of breath.   albuterol (2.5 MG/3ML) 0.083% nebulizer solution Commonly known as: PROVENTIL Take 3 mLs (2.5 mg total) by nebulization 3 (three) times daily.   diltiazem 120 MG 24 hr capsule Commonly known as: CARDIZEM CD Take 1 capsule (120 mg total) by mouth daily.   doxycycline 100 MG tablet Commonly known as: VIBRA-TABS Take 1 tablet (100 mg total) by mouth every 12 (twelve) hours for 4 days.   Fluticasone-Salmeterol 250-50 MCG/DOSE Aepb Commonly known as: Advair Diskus Inhale 1 puff into the lungs 2 (two) times daily.   furosemide 20 MG tablet Commonly known as: LASIX Take 1 tablet (20 mg total) by mouth daily.   ipratropium 0.02 % nebulizer solution Commonly known as: ATROVENT Take 2.5 mLs (0.5 mg total) by nebulization 3 (three) times daily.   lovastatin 10 MG tablet Commonly known as: MEVACOR TAKE 1 TABLET AT BEDTIME   mirtazapine 7.5 MG tablet Commonly known as: REMERON TAKE 1 TABLET AT BEDTIME   pantoprazole 20 MG tablet Commonly known as: PROTONIX Take 1 tablet (20 mg total) by mouth daily.   potassium chloride 10 MEQ tablet Commonly known as: K-DUR Take 1 tablet (10 mEq total) by mouth daily.   rivaroxaban 20 MG Tabs tablet Commonly known as: XARELTO Take 1 tablet (20 mg total) by mouth daily with supper.   tamsulosin 0.4 MG Caps capsule Commonly known as: FLOMAX TAKE 1 CAPSULE  DAILY AFTER  SUPPER.       Discharge Assessment: Vitals:   07/03/19 1336 07/03/19 1443  BP: 119/85   Pulse: 76   Resp: 17   Temp: 98.1 F (36.7 C)   SpO2: 100% 97%   Skin clean, dry and intact without evidence of skin break down, no evidence of skin tears noted. IV catheter discontinued intact. Site without signs and symptoms of complications - no redness or edema noted at insertion site, patient denies c/o pain - only slight tenderness at site.  Dressing with slight pressure applied.  D/c Instructions-Education: Discharge instructions given to patient/family with verbalized understanding. D/c education completed with patient/family including follow up instructions, medication list, d/c activities limitations if indicated, with other d/c instructions as indicated by MD - patient able to verbalize understanding, all questions fully answered. Patient instructed to return to ED, call 911, or call MD for any changes in condition.  Patient escorted via [place], and D/C home via private auto.  Berton Bon, RN 07/03/2019 5:03 PM

## 2019-07-04 ENCOUNTER — Telehealth: Payer: Self-pay

## 2019-07-04 DIAGNOSIS — E785 Hyperlipidemia, unspecified: Secondary | ICD-10-CM | POA: Diagnosis not present

## 2019-07-04 DIAGNOSIS — D638 Anemia in other chronic diseases classified elsewhere: Secondary | ICD-10-CM | POA: Diagnosis not present

## 2019-07-04 DIAGNOSIS — I4892 Unspecified atrial flutter: Secondary | ICD-10-CM | POA: Diagnosis not present

## 2019-07-04 DIAGNOSIS — I482 Chronic atrial fibrillation, unspecified: Secondary | ICD-10-CM | POA: Diagnosis not present

## 2019-07-04 DIAGNOSIS — J449 Chronic obstructive pulmonary disease, unspecified: Secondary | ICD-10-CM | POA: Diagnosis not present

## 2019-07-04 DIAGNOSIS — I10 Essential (primary) hypertension: Secondary | ICD-10-CM | POA: Diagnosis not present

## 2019-07-04 DIAGNOSIS — J189 Pneumonia, unspecified organism: Secondary | ICD-10-CM | POA: Diagnosis not present

## 2019-07-04 DIAGNOSIS — J440 Chronic obstructive pulmonary disease with acute lower respiratory infection: Secondary | ICD-10-CM | POA: Diagnosis not present

## 2019-07-04 DIAGNOSIS — I272 Pulmonary hypertension, unspecified: Secondary | ICD-10-CM | POA: Diagnosis not present

## 2019-07-04 NOTE — Telephone Encounter (Signed)
Transition Care Management Follow-up Telephone Call   Date discharged?   07/03/2019             How have you been since you were released from the hospital? weak   Do you understand why you were in the hospital? pneumonia   Do you understand the discharge instructions? His wife did and she will help him   Where were you discharged to? home   Items Reviewed:  Medications reviewed: yes  Allergies reviewed: yes  Dietary changes reviewed: yes  Referrals reviewed: yes   Functional Questionnaire:   Activities of Daily Living (ADLs): not yet but wife can help    Any transportation issues/concerns?: no   Any patient concerns? no   Confirmed importance and date/time of follow-up visits scheduled Jul 09, 2019 @ 2pm   Confirmed with patient if condition begins to worsen call PCP or go to the ER.  Patient was given the office number and encouraged to call back with question or concerns. Yes with verbal understanding

## 2019-07-04 NOTE — Telephone Encounter (Signed)
FYI  The nurse was following up on patient and called for verbal orders. She also stated she couldn't get patients oxygen above 90% and he was on supplemental oxygen at 3LPM. His heart rate was elevated and he seemed lethargic. She was going to give him a breathing treatment and see if he would come around. If he didn't she was going to call 911. I advised her to do so. I just spoke with the patient this morning while completing TOC call and he had no concerns.

## 2019-07-05 ENCOUNTER — Telehealth: Payer: Self-pay | Admitting: Family Medicine

## 2019-07-05 DIAGNOSIS — I272 Pulmonary hypertension, unspecified: Secondary | ICD-10-CM | POA: Diagnosis not present

## 2019-07-05 DIAGNOSIS — I10 Essential (primary) hypertension: Secondary | ICD-10-CM | POA: Diagnosis not present

## 2019-07-05 DIAGNOSIS — D638 Anemia in other chronic diseases classified elsewhere: Secondary | ICD-10-CM | POA: Diagnosis not present

## 2019-07-05 DIAGNOSIS — J440 Chronic obstructive pulmonary disease with acute lower respiratory infection: Secondary | ICD-10-CM | POA: Diagnosis not present

## 2019-07-05 DIAGNOSIS — I482 Chronic atrial fibrillation, unspecified: Secondary | ICD-10-CM | POA: Diagnosis not present

## 2019-07-05 DIAGNOSIS — I4892 Unspecified atrial flutter: Secondary | ICD-10-CM | POA: Diagnosis not present

## 2019-07-05 DIAGNOSIS — J189 Pneumonia, unspecified organism: Secondary | ICD-10-CM | POA: Diagnosis not present

## 2019-07-05 NOTE — Telephone Encounter (Signed)
Needs Verbal orders

## 2019-07-06 ENCOUNTER — Telehealth: Payer: Self-pay | Admitting: *Deleted

## 2019-07-06 ENCOUNTER — Other Ambulatory Visit: Payer: Self-pay | Admitting: Family Medicine

## 2019-07-06 DIAGNOSIS — J440 Chronic obstructive pulmonary disease with acute lower respiratory infection: Secondary | ICD-10-CM | POA: Diagnosis not present

## 2019-07-06 DIAGNOSIS — I10 Essential (primary) hypertension: Secondary | ICD-10-CM | POA: Diagnosis not present

## 2019-07-06 DIAGNOSIS — I4892 Unspecified atrial flutter: Secondary | ICD-10-CM | POA: Diagnosis not present

## 2019-07-06 DIAGNOSIS — J189 Pneumonia, unspecified organism: Secondary | ICD-10-CM | POA: Diagnosis not present

## 2019-07-06 DIAGNOSIS — D638 Anemia in other chronic diseases classified elsewhere: Secondary | ICD-10-CM | POA: Diagnosis not present

## 2019-07-06 DIAGNOSIS — I272 Pulmonary hypertension, unspecified: Secondary | ICD-10-CM | POA: Diagnosis not present

## 2019-07-06 DIAGNOSIS — I482 Chronic atrial fibrillation, unspecified: Secondary | ICD-10-CM | POA: Diagnosis not present

## 2019-07-06 NOTE — Telephone Encounter (Signed)
Advised patient and spouse to d/c lovastatin with verbal understanding.

## 2019-07-06 NOTE — Telephone Encounter (Signed)
I will d/c the lovastatin, pleaselet him know

## 2019-07-06 NOTE — Telephone Encounter (Signed)
The med profile flagged his cardizem 120mg  qd with the lovastatin 10mg  qd as a contraindication. It is a level one contraindication. Do you want the patient to continue these meds together?

## 2019-07-06 NOTE — Telephone Encounter (Signed)
Left message on identified vm giving verbal orders. °

## 2019-07-06 NOTE — Telephone Encounter (Signed)
Maria with [facility] called returning abbys call. Said abby gave orders but she needed to discuss a level 1 meidcation issue. Would like a return call at [phone]

## 2019-07-07 DIAGNOSIS — I10 Essential (primary) hypertension: Secondary | ICD-10-CM | POA: Diagnosis not present

## 2019-07-07 DIAGNOSIS — J440 Chronic obstructive pulmonary disease with acute lower respiratory infection: Secondary | ICD-10-CM | POA: Diagnosis not present

## 2019-07-07 DIAGNOSIS — I4892 Unspecified atrial flutter: Secondary | ICD-10-CM | POA: Diagnosis not present

## 2019-07-07 DIAGNOSIS — D638 Anemia in other chronic diseases classified elsewhere: Secondary | ICD-10-CM | POA: Diagnosis not present

## 2019-07-07 DIAGNOSIS — I272 Pulmonary hypertension, unspecified: Secondary | ICD-10-CM | POA: Diagnosis not present

## 2019-07-07 DIAGNOSIS — J189 Pneumonia, unspecified organism: Secondary | ICD-10-CM | POA: Diagnosis not present

## 2019-07-07 DIAGNOSIS — I482 Chronic atrial fibrillation, unspecified: Secondary | ICD-10-CM | POA: Diagnosis not present

## 2019-07-09 ENCOUNTER — Encounter: Payer: Self-pay | Admitting: Family Medicine

## 2019-07-09 ENCOUNTER — Ambulatory Visit (INDEPENDENT_AMBULATORY_CARE_PROVIDER_SITE_OTHER): Payer: Medicare HMO | Admitting: Family Medicine

## 2019-07-09 VITALS — BP 108/68 | HR 122 | Temp 97.9°F | Resp 16 | Ht 72.0 in | Wt 159.0 lb

## 2019-07-09 DIAGNOSIS — Z09 Encounter for follow-up examination after completed treatment for conditions other than malignant neoplasm: Secondary | ICD-10-CM

## 2019-07-09 NOTE — Patient Instructions (Signed)
F/U as before, call if you need me sooner  PLEAse continue medications that you are discharged on  Physical therapy for strengthening to start tomorrow  Please be careful not to fall and use wheelchair and walker

## 2019-07-10 DIAGNOSIS — I4892 Unspecified atrial flutter: Secondary | ICD-10-CM | POA: Diagnosis not present

## 2019-07-10 DIAGNOSIS — I482 Chronic atrial fibrillation, unspecified: Secondary | ICD-10-CM | POA: Diagnosis not present

## 2019-07-10 DIAGNOSIS — I10 Essential (primary) hypertension: Secondary | ICD-10-CM | POA: Diagnosis not present

## 2019-07-10 DIAGNOSIS — J189 Pneumonia, unspecified organism: Secondary | ICD-10-CM | POA: Diagnosis not present

## 2019-07-10 DIAGNOSIS — J440 Chronic obstructive pulmonary disease with acute lower respiratory infection: Secondary | ICD-10-CM | POA: Diagnosis not present

## 2019-07-10 DIAGNOSIS — I272 Pulmonary hypertension, unspecified: Secondary | ICD-10-CM | POA: Diagnosis not present

## 2019-07-10 DIAGNOSIS — D638 Anemia in other chronic diseases classified elsewhere: Secondary | ICD-10-CM | POA: Diagnosis not present

## 2019-07-11 DIAGNOSIS — I272 Pulmonary hypertension, unspecified: Secondary | ICD-10-CM | POA: Diagnosis not present

## 2019-07-11 DIAGNOSIS — I482 Chronic atrial fibrillation, unspecified: Secondary | ICD-10-CM | POA: Diagnosis not present

## 2019-07-11 DIAGNOSIS — J440 Chronic obstructive pulmonary disease with acute lower respiratory infection: Secondary | ICD-10-CM | POA: Diagnosis not present

## 2019-07-11 DIAGNOSIS — I10 Essential (primary) hypertension: Secondary | ICD-10-CM | POA: Diagnosis not present

## 2019-07-11 DIAGNOSIS — J189 Pneumonia, unspecified organism: Secondary | ICD-10-CM | POA: Diagnosis not present

## 2019-07-11 DIAGNOSIS — I4892 Unspecified atrial flutter: Secondary | ICD-10-CM | POA: Diagnosis not present

## 2019-07-11 DIAGNOSIS — D638 Anemia in other chronic diseases classified elsewhere: Secondary | ICD-10-CM | POA: Diagnosis not present

## 2019-07-12 DIAGNOSIS — D638 Anemia in other chronic diseases classified elsewhere: Secondary | ICD-10-CM | POA: Diagnosis not present

## 2019-07-12 DIAGNOSIS — I272 Pulmonary hypertension, unspecified: Secondary | ICD-10-CM | POA: Diagnosis not present

## 2019-07-12 DIAGNOSIS — J189 Pneumonia, unspecified organism: Secondary | ICD-10-CM | POA: Diagnosis not present

## 2019-07-12 DIAGNOSIS — I482 Chronic atrial fibrillation, unspecified: Secondary | ICD-10-CM | POA: Diagnosis not present

## 2019-07-12 DIAGNOSIS — J440 Chronic obstructive pulmonary disease with acute lower respiratory infection: Secondary | ICD-10-CM | POA: Diagnosis not present

## 2019-07-12 DIAGNOSIS — I4892 Unspecified atrial flutter: Secondary | ICD-10-CM | POA: Diagnosis not present

## 2019-07-12 DIAGNOSIS — I10 Essential (primary) hypertension: Secondary | ICD-10-CM | POA: Diagnosis not present

## 2019-07-14 ENCOUNTER — Encounter: Payer: Self-pay | Admitting: Family Medicine

## 2019-07-14 NOTE — Assessment & Plan Note (Signed)
Patient in for follow up of recent hospitalization. Discharge summary, and laboratory and radiology data are reviewed, and any questions or concerns about recent hospitalization are discussed. Specific issues requiring follow up are specifically addressed.  

## 2019-07-14 NOTE — Progress Notes (Signed)
   Kyle Mosley     MRN: [id number]      DOB: June 10, 1935   HPI Kyle Mosley is here for follow up of recent hospitalization from 08/08 to 07/03/2019 with a diagnosis of a fib with RVR and acute diastolic CHF exacerbation and CAP. States he feels better than at the time of admission , but remains weak. Will start in home PT in the morning Denies fever , chills or productive cough. Ongoing SOB, denies palpitations or leg swelling.hospital course is reviewed and all questions answered . Medication reconciliation is also done  ROS See HPI  . Denies sinus pressure, nasal congestion, ear pain or sore throat. Chronic  chest congestion, denies productive cough chronic  wheezing. Denies chest pains, palpitations and leg swelling Denies abdominal pain, nausea, vomiting,diarrhea or constipation.   Denies dysuria, frequency, hesitancy or incontinence. Denies joint pain, swelling and limitation in mobility. Denies headaches, seizures, numbness, or tingling. Denies depression, anxiety or insomnia. Denies skin break down or rash.   PE  BP 108/68   Pulse (!) 122   Temp 97.9 F (36.6 C) (Temporal)   Resp 16   Ht 6' (1.829 m)   Wt 159 lb (72.1 kg)   SpO2 91% Comment: 3 l/min oxygen  BMI 21.56 kg/m   Patient alert and oriented and in no cardiopulmonary distress.  HEENT: No facial asymmetry, EOMI,   oropharynx pink and moist.  Neck supple no JVD, no mass.  Chest: decreased air entry bilaterally with scattered wheezes.  CVS: S1, S2 no murmurs, no S3.Regular rate.  ABD: Soft non tender.   [id number]  edema  MS: decreased  ROM spine, shoulders, hips and knees.  Skin: Intact, no ulcerations or rash noted.  Psych: Good eye contact, normal affect. Memory intact not anxious or depressed appearing.  CNS: CN 2-12 intact [facility] discharge follow-up Patient in for follow up of recent hospitalization. Discharge summary, and laboratory and radiology data are reviewed, and  any questions or concerns about recent hospitalization are discussed. Specific issues requiring follow up are specifically addressed.

## 2019-07-17 DIAGNOSIS — I272 Pulmonary hypertension, unspecified: Secondary | ICD-10-CM | POA: Diagnosis not present

## 2019-07-17 DIAGNOSIS — D638 Anemia in other chronic diseases classified elsewhere: Secondary | ICD-10-CM | POA: Diagnosis not present

## 2019-07-17 DIAGNOSIS — I10 Essential (primary) hypertension: Secondary | ICD-10-CM

## 2019-07-17 DIAGNOSIS — J189 Pneumonia, unspecified organism: Secondary | ICD-10-CM | POA: Diagnosis not present

## 2019-07-17 DIAGNOSIS — I4892 Unspecified atrial flutter: Secondary | ICD-10-CM | POA: Diagnosis not present

## 2019-07-17 DIAGNOSIS — I482 Chronic atrial fibrillation, unspecified: Secondary | ICD-10-CM | POA: Diagnosis not present

## 2019-07-17 DIAGNOSIS — J440 Chronic obstructive pulmonary disease with acute lower respiratory infection: Secondary | ICD-10-CM | POA: Diagnosis not present

## 2019-07-18 ENCOUNTER — Telehealth: Payer: Self-pay | Admitting: Family Medicine

## 2019-07-18 NOTE — Telephone Encounter (Signed)
Ask case manager/ social worker to look into this

## 2019-07-18 NOTE — Telephone Encounter (Signed)
What can be done about this?

## 2019-07-18 NOTE — Telephone Encounter (Signed)
There will be no services until the Bed Bugs are taken care of

## 2019-07-31 ENCOUNTER — Inpatient Hospital Stay (HOSPITAL_COMMUNITY): Payer: Medicare HMO

## 2019-07-31 ENCOUNTER — Emergency Department (HOSPITAL_COMMUNITY): Payer: Medicare HMO

## 2019-07-31 ENCOUNTER — Encounter (HOSPITAL_COMMUNITY): Payer: Self-pay

## 2019-07-31 ENCOUNTER — Other Ambulatory Visit: Payer: Self-pay

## 2019-07-31 ENCOUNTER — Inpatient Hospital Stay (HOSPITAL_COMMUNITY)
Admission: EM | Admit: 2019-07-31 | Discharge: 2019-08-23 | DRG: 291 | Disposition: E | Payer: Medicare HMO | Attending: Emergency Medicine | Admitting: Emergency Medicine

## 2019-07-31 DIAGNOSIS — I5032 Chronic diastolic (congestive) heart failure: Secondary | ICD-10-CM | POA: Diagnosis present

## 2019-07-31 DIAGNOSIS — Z66 Do not resuscitate: Secondary | ICD-10-CM | POA: Diagnosis not present

## 2019-07-31 DIAGNOSIS — R296 Repeated falls: Secondary | ICD-10-CM | POA: Diagnosis present

## 2019-07-31 DIAGNOSIS — Z4682 Encounter for fitting and adjustment of non-vascular catheter: Secondary | ICD-10-CM | POA: Diagnosis not present

## 2019-07-31 DIAGNOSIS — Z79899 Other long term (current) drug therapy: Secondary | ICD-10-CM

## 2019-07-31 DIAGNOSIS — S2239XA Fracture of one rib, unspecified side, initial encounter for closed fracture: Secondary | ICD-10-CM | POA: Diagnosis present

## 2019-07-31 DIAGNOSIS — J440 Chronic obstructive pulmonary disease with acute lower respiratory infection: Secondary | ICD-10-CM | POA: Diagnosis present

## 2019-07-31 DIAGNOSIS — Z20828 Contact with and (suspected) exposure to other viral communicable diseases: Secondary | ICD-10-CM | POA: Diagnosis not present

## 2019-07-31 DIAGNOSIS — G9349 Other encephalopathy: Secondary | ICD-10-CM | POA: Diagnosis present

## 2019-07-31 DIAGNOSIS — J9601 Acute respiratory failure with hypoxia: Secondary | ICD-10-CM

## 2019-07-31 DIAGNOSIS — J441 Chronic obstructive pulmonary disease with (acute) exacerbation: Secondary | ICD-10-CM

## 2019-07-31 DIAGNOSIS — R609 Edema, unspecified: Secondary | ICD-10-CM | POA: Diagnosis not present

## 2019-07-31 DIAGNOSIS — R0602 Shortness of breath: Secondary | ICD-10-CM | POA: Diagnosis not present

## 2019-07-31 DIAGNOSIS — J95811 Postprocedural pneumothorax: Secondary | ICD-10-CM | POA: Diagnosis present

## 2019-07-31 DIAGNOSIS — N179 Acute kidney failure, unspecified: Secondary | ICD-10-CM | POA: Diagnosis not present

## 2019-07-31 DIAGNOSIS — Z6821 Body mass index (BMI) 21.0-21.9, adult: Secondary | ICD-10-CM

## 2019-07-31 DIAGNOSIS — S3681XA Injury of peritoneum, initial encounter: Secondary | ICD-10-CM | POA: Diagnosis not present

## 2019-07-31 DIAGNOSIS — S2241XA Multiple fractures of ribs, right side, initial encounter for closed fracture: Secondary | ICD-10-CM | POA: Diagnosis present

## 2019-07-31 DIAGNOSIS — D62 Acute posthemorrhagic anemia: Secondary | ICD-10-CM | POA: Diagnosis present

## 2019-07-31 DIAGNOSIS — J939 Pneumothorax, unspecified: Secondary | ICD-10-CM

## 2019-07-31 DIAGNOSIS — Z9911 Dependence on respirator [ventilator] status: Secondary | ICD-10-CM

## 2019-07-31 DIAGNOSIS — Z825 Family history of asthma and other chronic lower respiratory diseases: Secondary | ICD-10-CM

## 2019-07-31 DIAGNOSIS — R52 Pain, unspecified: Secondary | ICD-10-CM | POA: Diagnosis not present

## 2019-07-31 DIAGNOSIS — J9811 Atelectasis: Secondary | ICD-10-CM | POA: Diagnosis not present

## 2019-07-31 DIAGNOSIS — Z0189 Encounter for other specified special examinations: Secondary | ICD-10-CM

## 2019-07-31 DIAGNOSIS — J9622 Acute and chronic respiratory failure with hypercapnia: Secondary | ICD-10-CM | POA: Diagnosis not present

## 2019-07-31 DIAGNOSIS — J9621 Acute and chronic respiratory failure with hypoxia: Secondary | ICD-10-CM | POA: Diagnosis not present

## 2019-07-31 DIAGNOSIS — Z515 Encounter for palliative care: Secondary | ICD-10-CM | POA: Diagnosis not present

## 2019-07-31 DIAGNOSIS — I482 Chronic atrial fibrillation, unspecified: Secondary | ICD-10-CM | POA: Diagnosis present

## 2019-07-31 DIAGNOSIS — I4510 Unspecified right bundle-branch block: Secondary | ICD-10-CM | POA: Diagnosis not present

## 2019-07-31 DIAGNOSIS — R570 Cardiogenic shock: Principal | ICD-10-CM | POA: Diagnosis present

## 2019-07-31 DIAGNOSIS — R402431 Glasgow coma scale score 3-8, in the field [EMT or ambulance]: Secondary | ICD-10-CM | POA: Diagnosis not present

## 2019-07-31 DIAGNOSIS — J90 Pleural effusion, not elsewhere classified: Secondary | ICD-10-CM | POA: Diagnosis not present

## 2019-07-31 DIAGNOSIS — R4182 Altered mental status, unspecified: Secondary | ICD-10-CM | POA: Diagnosis not present

## 2019-07-31 DIAGNOSIS — I249 Acute ischemic heart disease, unspecified: Secondary | ICD-10-CM | POA: Diagnosis present

## 2019-07-31 DIAGNOSIS — T797XXA Traumatic subcutaneous emphysema, initial encounter: Secondary | ICD-10-CM | POA: Diagnosis present

## 2019-07-31 DIAGNOSIS — I469 Cardiac arrest, cause unspecified: Secondary | ICD-10-CM | POA: Diagnosis not present

## 2019-07-31 DIAGNOSIS — E162 Hypoglycemia, unspecified: Secondary | ICD-10-CM | POA: Diagnosis not present

## 2019-07-31 DIAGNOSIS — I462 Cardiac arrest due to underlying cardiac condition: Secondary | ICD-10-CM | POA: Diagnosis present

## 2019-07-31 DIAGNOSIS — Z7951 Long term (current) use of inhaled steroids: Secondary | ICD-10-CM

## 2019-07-31 DIAGNOSIS — I959 Hypotension, unspecified: Secondary | ICD-10-CM | POA: Diagnosis present

## 2019-07-31 DIAGNOSIS — Z87891 Personal history of nicotine dependence: Secondary | ICD-10-CM

## 2019-07-31 DIAGNOSIS — J449 Chronic obstructive pulmonary disease, unspecified: Secondary | ICD-10-CM | POA: Diagnosis not present

## 2019-07-31 DIAGNOSIS — J9690 Respiratory failure, unspecified, unspecified whether with hypoxia or hypercapnia: Secondary | ICD-10-CM | POA: Diagnosis not present

## 2019-07-31 DIAGNOSIS — R7303 Prediabetes: Secondary | ICD-10-CM | POA: Diagnosis present

## 2019-07-31 DIAGNOSIS — S299XXA Unspecified injury of thorax, initial encounter: Secondary | ICD-10-CM

## 2019-07-31 DIAGNOSIS — S2220XA Unspecified fracture of sternum, initial encounter for closed fracture: Secondary | ICD-10-CM | POA: Diagnosis present

## 2019-07-31 DIAGNOSIS — I110 Hypertensive heart disease with heart failure: Secondary | ICD-10-CM | POA: Diagnosis present

## 2019-07-31 DIAGNOSIS — R55 Syncope and collapse: Secondary | ICD-10-CM

## 2019-07-31 DIAGNOSIS — E874 Mixed disorder of acid-base balance: Secondary | ICD-10-CM | POA: Diagnosis not present

## 2019-07-31 DIAGNOSIS — I2729 Other secondary pulmonary hypertension: Secondary | ICD-10-CM | POA: Diagnosis present

## 2019-07-31 DIAGNOSIS — Z9981 Dependence on supplemental oxygen: Secondary | ICD-10-CM

## 2019-07-31 DIAGNOSIS — I4901 Ventricular fibrillation: Secondary | ICD-10-CM | POA: Diagnosis present

## 2019-07-31 DIAGNOSIS — J189 Pneumonia, unspecified organism: Secondary | ICD-10-CM | POA: Diagnosis not present

## 2019-07-31 DIAGNOSIS — J9311 Primary spontaneous pneumothorax: Secondary | ICD-10-CM | POA: Diagnosis not present

## 2019-07-31 DIAGNOSIS — Z7901 Long term (current) use of anticoagulants: Secondary | ICD-10-CM

## 2019-07-31 DIAGNOSIS — A419 Sepsis, unspecified organism: Secondary | ICD-10-CM | POA: Diagnosis not present

## 2019-07-31 DIAGNOSIS — R404 Transient alteration of awareness: Secondary | ICD-10-CM | POA: Diagnosis not present

## 2019-07-31 DIAGNOSIS — L8990 Pressure ulcer of unspecified site, unspecified stage: Secondary | ICD-10-CM | POA: Insufficient documentation

## 2019-07-31 DIAGNOSIS — R64 Cachexia: Secondary | ICD-10-CM | POA: Diagnosis present

## 2019-07-31 DIAGNOSIS — R34 Anuria and oliguria: Secondary | ICD-10-CM | POA: Diagnosis present

## 2019-07-31 DIAGNOSIS — R6521 Severe sepsis with septic shock: Secondary | ICD-10-CM | POA: Diagnosis not present

## 2019-07-31 DIAGNOSIS — K661 Hemoperitoneum: Secondary | ICD-10-CM | POA: Diagnosis present

## 2019-07-31 DIAGNOSIS — J942 Hemothorax: Secondary | ICD-10-CM | POA: Diagnosis present

## 2019-07-31 DIAGNOSIS — E871 Hypo-osmolality and hyponatremia: Secondary | ICD-10-CM | POA: Diagnosis not present

## 2019-07-31 DIAGNOSIS — Z4659 Encounter for fitting and adjustment of other gastrointestinal appliance and device: Secondary | ICD-10-CM

## 2019-07-31 DIAGNOSIS — N400 Enlarged prostate without lower urinary tract symptoms: Secondary | ICD-10-CM | POA: Diagnosis present

## 2019-07-31 DIAGNOSIS — E785 Hyperlipidemia, unspecified: Secondary | ICD-10-CM | POA: Diagnosis present

## 2019-07-31 DIAGNOSIS — R972 Elevated prostate specific antigen [PSA]: Secondary | ICD-10-CM | POA: Diagnosis present

## 2019-07-31 DIAGNOSIS — Z8719 Personal history of other diseases of the digestive system: Secondary | ICD-10-CM

## 2019-07-31 DIAGNOSIS — G4701 Insomnia due to medical condition: Secondary | ICD-10-CM | POA: Diagnosis present

## 2019-07-31 DIAGNOSIS — Z8249 Family history of ischemic heart disease and other diseases of the circulatory system: Secondary | ICD-10-CM

## 2019-07-31 DIAGNOSIS — D638 Anemia in other chronic diseases classified elsewhere: Secondary | ICD-10-CM | POA: Diagnosis present

## 2019-07-31 DIAGNOSIS — F0390 Unspecified dementia without behavioral disturbance: Secondary | ICD-10-CM | POA: Diagnosis present

## 2019-07-31 DIAGNOSIS — R791 Abnormal coagulation profile: Secondary | ICD-10-CM | POA: Diagnosis not present

## 2019-07-31 DIAGNOSIS — D696 Thrombocytopenia, unspecified: Secondary | ICD-10-CM | POA: Diagnosis not present

## 2019-07-31 DIAGNOSIS — J982 Interstitial emphysema: Secondary | ICD-10-CM | POA: Diagnosis not present

## 2019-07-31 DIAGNOSIS — J439 Emphysema, unspecified: Secondary | ICD-10-CM | POA: Diagnosis not present

## 2019-07-31 LAB — URINALYSIS, ROUTINE W REFLEX MICROSCOPIC
Bilirubin Urine: NEGATIVE
Glucose, UA: NEGATIVE mg/dL
Hgb urine dipstick: NEGATIVE
Ketones, ur: NEGATIVE mg/dL
Leukocytes,Ua: NEGATIVE
Nitrite: NEGATIVE
Protein, ur: 30 mg/dL — AB
Specific Gravity, Urine: 1.012 (ref 1.005–1.030)
pH: 5 (ref 5.0–8.0)

## 2019-07-31 LAB — BASIC METABOLIC PANEL
Anion gap: 9 (ref 5–15)
BUN: 21 mg/dL (ref 8–23)
CO2: 39 mmol/L — ABNORMAL HIGH (ref 22–32)
Calcium: 8.3 mg/dL — ABNORMAL LOW (ref 8.9–10.3)
Chloride: 95 mmol/L — ABNORMAL LOW (ref 98–111)
Creatinine, Ser: 0.88 mg/dL (ref 0.61–1.24)
GFR calc Af Amer: >60 mL/min (ref >60)
GFR calc non Af Amer: >60 mL/min (ref >60)
Glucose, Bld: 97 mg/dL (ref 70–99)
Potassium: 4.4 mmol/L (ref 3.5–5.1)
Sodium: 143 mmol/L (ref 135–145)

## 2019-07-31 LAB — CBC WITH DIFFERENTIAL/PLATELET
Abs Immature Granulocytes: 0.02 10*3/uL (ref 0.00–0.07)
Basophils Absolute: 0 10*3/uL (ref 0.0–0.1)
Basophils Relative: 0 %
Eosinophils Absolute: 0.1 10*3/uL (ref 0.0–0.5)
Eosinophils Relative: 2 %
HCT: 32.2 % — ABNORMAL LOW (ref 39.0–52.0)
Hemoglobin: 9 g/dL — ABNORMAL LOW (ref 13.0–17.0)
Immature Granulocytes: 0 %
Lymphocytes Relative: 9 %
Lymphs Abs: 0.4 10*3/uL — ABNORMAL LOW (ref 0.7–4.0)
MCH: 28.5 pg (ref 26.0–34.0)
MCHC: 28 g/dL — ABNORMAL LOW (ref 30.0–36.0)
MCV: 101.9 fL — ABNORMAL HIGH (ref 80.0–100.0)
Monocytes Absolute: 0.5 10*3/uL (ref 0.1–1.0)
Monocytes Relative: 10 %
Neutro Abs: 3.8 10*3/uL (ref 1.7–7.7)
Neutrophils Relative %: 79 %
Platelets: 241 10*3/uL (ref 150–400)
RBC: 3.16 MIL/uL — ABNORMAL LOW (ref 4.22–5.81)
RDW: 14.3 % (ref 11.5–15.5)
WBC: 4.8 10*3/uL (ref 4.0–10.5)
nRBC: 0 % (ref 0.0–0.2)

## 2019-07-31 LAB — POCT I-STAT EG7
Acid-Base Excess: 15 mmol/L — ABNORMAL HIGH (ref 0.0–2.0)
Bicarbonate: 43.6 mmol/L — ABNORMAL HIGH (ref 20.0–28.0)
Calcium, Ion: 1.12 mmol/L — ABNORMAL LOW (ref 1.15–1.40)
HCT: 31 % — ABNORMAL LOW (ref 39.0–52.0)
Hemoglobin: 10.5 g/dL — ABNORMAL LOW (ref 13.0–17.0)
O2 Saturation: 98 %
Potassium: 4.4 mmol/L (ref 3.5–5.1)
Sodium: 141 mmol/L (ref 135–145)
TCO2: 46 mmol/L — ABNORMAL HIGH (ref 22–32)
pCO2, Ven: 83.3 mmHg (ref 44.0–60.0)
pH, Ven: 7.327 (ref 7.250–7.430)
pO2, Ven: 127 mmHg — ABNORMAL HIGH (ref 32.0–45.0)

## 2019-07-31 LAB — POCT I-STAT 7, (LYTES, BLD GAS, ICA,H+H)
Acid-Base Excess: 14 mmol/L — ABNORMAL HIGH (ref 0.0–2.0)
Bicarbonate: 41.7 mmol/L — ABNORMAL HIGH (ref 20.0–28.0)
Calcium, Ion: 1.06 mmol/L — ABNORMAL LOW (ref 1.15–1.40)
HCT: 31 % — ABNORMAL LOW (ref 39.0–52.0)
Hemoglobin: 10.5 g/dL — ABNORMAL LOW (ref 13.0–17.0)
O2 Saturation: 96 %
Patient temperature: 97.5
Potassium: 4.6 mmol/L (ref 3.5–5.1)
Sodium: 146 mmol/L — ABNORMAL HIGH (ref 135–145)
TCO2: 44 mmol/L — ABNORMAL HIGH (ref 22–32)
pCO2 arterial: 65.1 mmHg (ref 32.0–48.0)
pH, Arterial: 7.412 (ref 7.350–7.450)
pO2, Arterial: 80 mmHg — ABNORMAL LOW (ref 83.0–108.0)

## 2019-07-31 LAB — BLOOD GAS, ARTERIAL
Acid-Base Excess: 14.3 mmol/L — ABNORMAL HIGH (ref 0.0–2.0)
Bicarbonate: 41.9 mmol/L — ABNORMAL HIGH (ref 20.0–28.0)
Drawn by: 441351
FIO2: 1
MECHVT: 510 mL
O2 Saturation: 99.1 %
PEEP: 5 cmH2O
Patient temperature: 97.5
RATE: 26 resp/min
pCO2 arterial: 97.1 mmHg (ref 32.0–48.0)
pH, Arterial: 7.254 — ABNORMAL LOW (ref 7.350–7.450)
pO2, Arterial: 212 mmHg — ABNORMAL HIGH (ref 83.0–108.0)

## 2019-07-31 LAB — CBG MONITORING, ED: Glucose-Capillary: 86 mg/dL (ref 70–99)

## 2019-07-31 LAB — T4, FREE: Free T4: 1.09 ng/dL (ref 0.61–1.12)

## 2019-07-31 LAB — BRAIN NATRIURETIC PEPTIDE: B Natriuretic Peptide: 203.6 pg/mL — ABNORMAL HIGH (ref 0.0–100.0)

## 2019-07-31 LAB — LACTIC ACID, PLASMA: Lactic Acid, Venous: 1.8 mmol/L (ref 0.5–1.9)

## 2019-07-31 LAB — TSH: TSH: 4.228 u[IU]/mL (ref 0.350–4.500)

## 2019-07-31 LAB — TROPONIN I (HIGH SENSITIVITY): Troponin I (High Sensitivity): 11 ng/L (ref <18)

## 2019-07-31 MED ORDER — EPINEPHRINE 1 MG/10ML IJ SOSY
PREFILLED_SYRINGE | INTRAMUSCULAR | Status: AC | PRN
  Administered 2019-07-31 (×5): 1 via INTRAVENOUS

## 2019-07-31 MED ORDER — SODIUM CHLORIDE 0.9 % IV SOLN
250.0000 mL | INTRAVENOUS | Status: DC
  Administered 2019-08-01 – 2019-08-02 (×2): 250 mL via INTRAVENOUS

## 2019-07-31 MED ORDER — METHYLPREDNISOLONE SODIUM SUCC 40 MG IJ SOLR
40.0000 mg | Freq: Two times a day (BID) | INTRAMUSCULAR | Status: DC
  Administered 2019-08-01 – 2019-08-02 (×3): 40 mg via INTRAVENOUS
  Filled 2019-07-31 (×3): qty 1

## 2019-07-31 MED ORDER — NOREPINEPHRINE 4 MG/250ML-% IV SOLN
0.0000 ug/min | INTRAVENOUS | Status: DC
  Administered 2019-07-31: 21:00:00 2 ug/min via INTRAVENOUS

## 2019-07-31 MED ORDER — ALBUTEROL SULFATE (2.5 MG/3ML) 0.083% IN NEBU: 2.5000 mg | INHALATION_SOLUTION | RESPIRATORY_TRACT | Status: DC | PRN

## 2019-07-31 MED ORDER — PIPERACILLIN-TAZOBACTAM 3.375 G IVPB 30 MIN
3.3750 g | Freq: Once | INTRAVENOUS | Status: AC
  Administered 2019-07-31: 3.375 g via INTRAVENOUS
  Filled 2019-07-31: qty 50

## 2019-07-31 MED ORDER — MAGNESIUM SULFATE 2 GM/50ML IV SOLN
2.0000 g | Freq: Once | INTRAVENOUS | Status: AC
  Administered 2019-07-31: 2 g via INTRAVENOUS
  Filled 2019-07-31: qty 50

## 2019-07-31 MED ORDER — SODIUM CHLORIDE 0.9 % IV SOLN
INTRAVENOUS | Status: AC | PRN
  Administered 2019-07-31: 1000 mL via INTRAVENOUS

## 2019-07-31 MED ORDER — DEXTROSE 5 % IV SOLN
INTRAVENOUS | Status: AC | PRN
  Administered 2019-07-31: 21:00:00 300 mg via INTRAVENOUS

## 2019-07-31 MED ORDER — IPRATROPIUM-ALBUTEROL 0.5-2.5 (3) MG/3ML IN SOLN
3.0000 mL | RESPIRATORY_TRACT | Status: DC
  Administered 2019-08-01 – 2019-08-06 (×25): 3 mL via RESPIRATORY_TRACT
  Filled 2019-07-31 (×27): qty 3

## 2019-07-31 MED ORDER — FENTANYL CITRATE (PF) 100 MCG/2ML IJ SOLN
100.0000 ug | Freq: Once | INTRAMUSCULAR | Status: AC
  Administered 2019-07-31: 100 ug via INTRAVENOUS
  Filled 2019-07-31: qty 2

## 2019-07-31 MED ORDER — VECURONIUM BROMIDE 10 MG IV SOLR
10.0000 mg | Freq: Once | INTRAVENOUS | Status: DC
  Filled 2019-07-31: qty 10

## 2019-07-31 MED ORDER — VANCOMYCIN HCL 10 G IV SOLR
1500.0000 mg | Freq: Once | INTRAVENOUS | Status: AC
  Administered 2019-07-31: 1500 mg via INTRAVENOUS
  Filled 2019-07-31: qty 1500

## 2019-07-31 MED ORDER — SODIUM BICARBONATE 8.4 % IV SOLN
INTRAVENOUS | Status: AC | PRN
  Administered 2019-07-31 (×4): 50 meq via INTRAVENOUS

## 2019-07-31 MED ORDER — LIDOCAINE HCL (PF) 1 % IJ SOLN
INTRAMUSCULAR | Status: AC
  Filled 2019-07-31: qty 30

## 2019-07-31 MED ORDER — NOREPINEPHRINE 4 MG/250ML-% IV SOLN: 2.0000 ug/min | INTRAVENOUS | Status: DC

## 2019-07-31 MED ORDER — SODIUM CHLORIDE 0.9 % IV BOLUS
1000.0000 mL | Freq: Once | INTRAVENOUS | Status: AC
  Administered 2019-07-31: 1000 mL via INTRAVENOUS

## 2019-07-31 MED ORDER — METHYLPREDNISOLONE SODIUM SUCC 125 MG IJ SOLR
125.0000 mg | Freq: Once | INTRAMUSCULAR | Status: AC
  Administered 2019-07-31: 125 mg via INTRAVENOUS
  Filled 2019-07-31: qty 2

## 2019-07-31 MED ORDER — MIDAZOLAM HCL 2 MG/2ML IJ SOLN
2.0000 mg | Freq: Once | INTRAMUSCULAR | Status: AC
  Administered 2019-07-31: 2 mg via INTRAVENOUS
  Filled 2019-07-31: qty 2

## 2019-07-31 NOTE — ED Notes (Signed)
Pt used urinal to give urine sample

## 2019-07-31 NOTE — Consult Note (Addendum)
Patient presented to the hospital via EMS for evaluation of altered mental status and episode of unresponsiveness while visiting his wife at an assisted living center.  Arrived to the ED alert, interactive, and following commands.  He was diagnosed with acute COPD exacerbation and pneumonia in the ED.  Called by ED provider to admit the patient for acute COPD exacerbation and pneumonia.  It was communicated to me that patient's oxygen saturation was in the upper 80s on room air but oxygen saturation maintaining at 100% on 3 L home oxygen.  It was reported that the patient was talking/ answering questions and in no distress.  He received Solu-Medrol 125 mg, IV magnesium 2 g, vancomycin, Zosyn, and 1 L fluid bolus in the ED.  Bed request was placed and when I walked into the patient's room it was noted that the patient was unresponsive.  He had no pulse and no respirations. CODE BLUE initiated.  Initial rhythm asystole, subsequently V. tach.  Patient received shocks and several rounds of medications including epinephrine, bicarbonate, and amiodarone. ROSC achieved.  Patient is not stable to be admitted under hospitalist service. Critical care consultation and ICU admission requested.

## 2019-07-31 NOTE — ED Provider Notes (Signed)
[facility] EMERGENCY DEPARTMENT Provider Note   CSN: [id number] Arrival date & time: 08/14/2019  1714     History   Chief Complaint Chief Complaint  Patient presents with  . Altered Mental Status  . Fall    HPI Kyle Mosley is a 84 y.o. male.     84 yo M with a cc of a syncopal event.  The patient is unsure exactly what happened.  He denies any complaints at present.  He asked me to talk to his wife so that she can tell me what is going on.  Level 5 caveat dementia.  I discussed the case with the wife.  She says that for about the past 10 months or so the patient has become more and more sedentary.  Very rarely gets up and moves around.  Today they had someone coming over to the house that made it so that they had to leave and so they went out with the wife's brother.  States that he was able to get around okay but when it was time to leave he became weak and ended up collapsing.  911 was called.  She denies any recent complaints that he has been having but states that he has been using his inhaler much more often than normal.  She denies chest pain or fever.  He has had progressive lower extremity edema that has been going on for at least the past 3 weeks.  She states that the last time this happened he had to come into the hospital.  The history is provided by the patient.  Altered Mental Status Presenting symptoms: no confusion   Associated symptoms: no abdominal pain, no fever, no headaches, no palpitations, no rash and no vomiting   Fall Pertinent negatives include no chest pain, no abdominal pain, no headaches and no shortness of breath.  Illness Severity:  Moderate Onset quality:  Gradual Duration:  2 weeks Timing:  Constant Progression:  Worsening Chronicity:  New Associated symptoms: no abdominal pain, no chest pain, no congestion, no diarrhea, no fever, no headaches, no myalgias, no rash, no shortness of breath and no vomiting     Past Medical  History:  Diagnosis Date  . Asthma   . Atrial fibrillation ([place]) 03/31/2019   New onset a fib during 01/2019 admission, d/c on xarelto per cardiology recommendation  . Colon polyps   . COPD (chronic obstructive pulmonary disease) ([place])    3L home O2  . Diverticulosis   . Enlarged prostate    with elevated PSA  . Hyperlipidemia   . Hypertension   . Prediabetes 2011  . Pulmonary hypertension ([place]) 2012    Patient Active Problem List   Diagnosis Date Noted  . Acute on chronic respiratory failure with hypoxia and hypercapnia ([place]) 07/30/2019  . Atrial fibrillation with tachycardic ventricular rate ([place]) 06/30/2019  . Atrial flutter with rapid ventricular response ([place]) 06/30/2019  . Supplemental oxygen dependent 06/30/2019  . Generalized weakness 06/30/2019  . Atrial fibrillation ([place]) 03/31/2019  . Chronic anticoagulation 03/31/2019  . Insomnia due to medical condition 03/31/2019  . Protein-calorie malnutrition, severe 11/29/2017  . Aortic aneurysm ([place])   . Hospital discharge follow-up 03/05/2017  . CAP (community acquired pneumonia) 02/17/2017  . Hypoxia 12/28/2016  . Loss of weight 05/27/2016  . Elevated PSA 03/26/2015  . Reduced vision 05/27/2014  . Pulmonary HTN ([place]) 06/07/2013  . COPD (chronic obstructive pulmonary disease) ([place]) 05/08/2013  . Primary generalized (osteo)arthritis 01/01/2013  . Prediabetes  2011  . Anemia, chronic disease 2011  . Elevated brain natriuretic peptide (BNP) level 2011  . Bronchial asthma 09/30/2009  . FATIGUE 06/23/2009  . POLYP, COLON 04/08/2008  . Hyperlipidemia LDL goal <100 04/08/2008    Past Surgical History:  Procedure Laterality Date  . COLONOSCOPY  03/21/2007   Dr. Jonny Ruiz diverticula, normal rectum  . COLONOSCOPY  05/04/2012   Procedure: COLONOSCOPY;  Surgeon: Daneil Dolin, MD;  Location: [facility];  Service: Endoscopy;  Laterality: N/A;  7:30  . PROSTATE BIOPSY          Home Medications    Prior to  Admission medications   Medication Sig Start Date End Date Taking? Authorizing Provider  albuterol (PROVENTIL) (2.5 MG/3ML) 0.083% nebulizer solution Take 3 mLs (2.5 mg total) by nebulization 3 (three) times daily. 07/03/19  Yes Johnson, Clanford L, MD  albuterol (VENTOLIN HFA) 108 (90 Base) MCG/ACT inhaler Inhale 2 puffs into the lungs every 6 (six) hours as needed for wheezing or shortness of breath. 02/09/18  Yes Fayrene Helper, MD  diltiazem (CARDIZEM CD) 120 MG 24 hr capsule Take 1 capsule (120 mg total) by mouth daily. 07/03/19  Yes Johnson, Clanford L, MD  Fluticasone-Salmeterol (ADVAIR DISKUS) 250-50 MCG/DOSE AEPB Inhale 1 puff into the lungs 2 (two) times daily. 03/31/19 08/10/2019 Yes Fayrene Helper, MD  furosemide (LASIX) 20 MG tablet Take 1 tablet (20 mg total) by mouth daily. 07/03/19  Yes Johnson, Clanford L, MD  ipratropium (ATROVENT) 0.02 % nebulizer solution Take 2.5 mLs (0.5 mg total) by nebulization 3 (three) times daily. 07/03/19  Yes Johnson, Clanford L, MD  mirtazapine (REMERON) 7.5 MG tablet TAKE 1 TABLET AT BEDTIME Patient taking differently: Take 7.5 mg by mouth at bedtime.  06/25/19  Yes Fayrene Helper, MD  pantoprazole (PROTONIX) 20 MG tablet Take 1 tablet (20 mg total) by mouth daily. 03/29/19  Yes Fayrene Helper, MD  tamsulosin (FLOMAX) 0.4 MG CAPS capsule TAKE 1 CAPSULE  DAILY AFTER SUPPER. Patient taking differently: Take 0.4 mg by mouth daily after supper.  06/25/19  Yes Fayrene Helper, MD  KLOR-CON M20 20 MEQ tablet Take 20 mEq by mouth daily. 07/03/19   [provider]  potassium chloride SA (K-DUR) 10 MEQ tablet Take 1 tablet (10 mEq total) by mouth daily. Patient not taking: Reported on 08/10/2019 07/03/19   Murlean Iba, MD  rivaroxaban (XARELTO) 20 MG TABS tablet Take 1 tablet (20 mg total) by mouth daily with supper. Patient not taking: Reported on 07/27/2019 04/05/19   Fayrene Helper, MD    Family History Family History  Problem  Relation Age of Onset  . Heart attack Father   . Alcohol abuse Mother   . Hypertension Brother   . COPD Sister     Social History Social History   Tobacco Use  . Smoking status: Former Smoker    Packs/day: 1.00    Years: 25.00    Pack years: 25.00    Quit date: 11/24/1996    Years since quitting: 22.6  . Smokeless tobacco: Never Used  Substance Use Topics  . Alcohol use: No  . Drug use: No     Allergies   Patient has no known allergies.   Review of Systems Review of Systems  Unable to perform ROS: Dementia  Constitutional: Negative for chills and fever.  HENT: Negative for congestion and facial swelling.   Eyes: Negative for discharge and visual disturbance.  Respiratory: Negative for shortness of  breath.   Cardiovascular: Negative for chest pain and palpitations.  Gastrointestinal: Negative for abdominal pain, diarrhea and vomiting.  Musculoskeletal: Negative for arthralgias and myalgias.  Skin: Negative for color change and rash.  Neurological: Negative for tremors, syncope and headaches.  Psychiatric/Behavioral: Negative for confusion and dysphoric mood.     Physical Exam Updated Vital Signs BP 101/79   Pulse (!) 105   Temp (!) 97.5 F (36.4 C) (Rectal)   Resp (!) 26   Ht 6' (1.829 m)   Wt 72.1 kg   SpO2 98%   BMI 21.56 kg/m   Physical Exam Vitals signs and nursing note reviewed.  Constitutional:      Appearance: He is well-developed.     Comments: Chronically ill-appearing.  Cachectic  HENT:     Head: Normocephalic and atraumatic.     Comments: Tacky mucous membranes. Eyes:     Pupils: Pupils are equal, round, and reactive to light.  Neck:     Musculoskeletal: Normal range of motion and neck supple.     Vascular: No JVD.  Cardiovascular:     Rate and Rhythm: Regular rhythm. Tachycardia present.     Heart sounds: No murmur. No friction rub. No gallop.   Pulmonary:     Effort: No respiratory distress.     Breath sounds: No wheezing.      Comments: Diminished in all fields Abdominal:     General: There is no distension.     Tenderness: There is no abdominal tenderness. There is no guarding or rebound.  Musculoskeletal: Normal range of motion.     Right lower leg: Edema present.     Left lower leg: Edema present.     Comments: 4+ bilateral lower extremity edema up to thigh  Skin:    Coloration: Skin is not pale.     Findings: No rash.  Neurological:     Mental Status: He is alert and oriented to person, place, and time.     Comments: Somewhat slow to respond.  Does not understand all commands.  Otherwise benign neurologic exam.  Psychiatric:        Behavior: Behavior normal.      ED Treatments / Results  Labs (all labs ordered are listed, but only abnormal results are displayed) Labs Reviewed  CBC WITH DIFFERENTIAL/PLATELET - Abnormal; Notable for the following components:      Result Value   RBC 3.16 (*)    Hemoglobin 9.0 (*)    HCT 32.2 (*)    MCV 101.9 (*)    MCHC 28.0 (*)    Lymphs Abs 0.4 (*)    All other components within normal limits  BASIC METABOLIC PANEL - Abnormal; Notable for the following components:   Chloride 95 (*)    CO2 39 (*)    Calcium 8.3 (*)    All other components within normal limits  BRAIN NATRIURETIC PEPTIDE - Abnormal; Notable for the following components:   B Natriuretic Peptide 203.6 (*)    All other components within normal limits  URINALYSIS, ROUTINE W REFLEX MICROSCOPIC - Abnormal; Notable for the following components:   APPearance HAZY (*)    Protein, ur 30 (*)    Bacteria, UA RARE (*)    All other components within normal limits  BLOOD GAS, ARTERIAL - Abnormal; Notable for the following components:   pH, Arterial 7.254 (*)    pCO2 arterial 97.1 (*)    pO2, Arterial 212 (*)    Bicarbonate 41.9 (*)  Acid-Base Excess 14.3 (*)    All other components within normal limits  POCT I-STAT EG7 - Abnormal; Notable for the following components:   pCO2, Ven 83.3 (*)    pO2,  Ven 127.0 (*)    Bicarbonate 43.6 (*)    TCO2 46 (*)    Acid-Base Excess 15.0 (*)    Calcium, Ion 1.12 (*)    HCT 31.0 (*)    Hemoglobin 10.5 (*)    All other components within normal limits  SARS CORONAVIRUS 2 (HOSPITAL ORDER, [facility])  CULTURE, BLOOD (ROUTINE X 2)  CULTURE, BLOOD (ROUTINE X 2)  TSH  T4, FREE  LACTIC ACID, PLASMA  LACTIC ACID, PLASMA  CBG MONITORING, ED  TROPONIN I (HIGH SENSITIVITY)  TROPONIN I (HIGH SENSITIVITY)    EKG EKG Interpretation  Date/Time:  Tuesday July 31 2019 17:19:20 EDT Ventricular Rate:  126 PR Interval:    QRS Duration: 166 QT Interval:  372 QTC Calculation: 539 R Axis:   -82 Text Interpretation:  Sinus tachycardia RBBB and LAFB No significant change since last tracing Confirmed by Deno Etienne [phone]) on 08/22/2019 5:23:51 PM   Radiology Ct Head Wo Contrast  Result Date: 08/22/2019 CLINICAL DATA:  84 year old male with altered mental status. EXAM: CT HEAD WITHOUT CONTRAST TECHNIQUE: Contiguous axial images were obtained from the base of the skull through the vertex without intravenous contrast. COMPARISON:  None. FINDINGS: Brain: There is mild age-related atrophy and chronic microvascular ischemic changes. There is no acute intracranial hemorrhage. No mass effect or midline shift. No extra-axial fluid collection. Vascular: No hyperdense vessel or unexpected calcification. Skull: Normal. Negative for fracture or focal lesion. Sinuses/Orbits: A 2 cm left maxillary sinus retention cyst or polyp. Mild mucoperiosteal thickening of paranasal sinuses. No air-fluid level. The mastoid air cells are clear. Other: None IMPRESSION: 1. No acute intracranial pathology. 2. Mild age-related atrophy and chronic microvascular ischemic changes. Electronically Signed   By: Anner Crete M.D.   On: 08/21/2019 18:54   Dg Chest Port 1 View  Result Date: 08/12/2019 CLINICAL DATA:  [place].  Chest trauma. EXAM: PORTABLE CHEST 1 VIEW  COMPARISON:  Earlier film from 1736 hours. FINDINGS: There is an NG tube coursing down the esophagus and into the stomach. The endotracheal tube is approximately 7 cm above the carina. External pacer paddles are in place. Interval development of extensive subcutaneous emphysema. I do not see an obvious pneumothorax or pneumomediastinum. The heart is normal in size. Mild tortuosity and calcification of the thoracic aorta. New airspace opacification in the right perihilar region could be a contusion or aspiration. IMPRESSION: 1. The endotracheal tube and NG tubes appear to be in good position without complicating features. 2. New extensive subcutaneous emphysema without obvious pneumothorax or pneumomediastinum. 3. New right lung airspace opacity could be pulmonary contusion or aspiration. Electronically Signed   By: Marijo Sanes M.D.   On: 08/13/2019 21:43   Dg Chest Port 1 View  Result Date: 07/30/2019 CLINICAL DATA:  Weakness EXAM: PORTABLE CHEST 1 VIEW COMPARISON:  06/30/2019, 02/18/2019 FINDINGS: Hyperinflation with emphysematous disease. Hyper lucency at the right apex, felt secondary to apical bulla. Small right-sided pleural effusion with airspace disease at the right base. Normal heart size. No pneumothorax. IMPRESSION: 1. Hyperinflation with emphysematous disease 2. Small right-sided pleural effusion with airspace disease at the right base, atelectasis versus pneumonia Electronically Signed   By: Donavan Foil M.D.   On: 08/21/2019 17:54    Procedures Procedure Name: Intubation Date/Time:  08/07/2019 9:58 PM Performed by: Deno Etienne, DO Pre-anesthesia Checklist: Patient identified, Emergency Drugs available and Suction available Oxygen Delivery Method: Non-rebreather mask Preoxygenation: Pre-oxygenation with 100% oxygen Ventilation: Mask ventilation without difficulty Laryngoscope Size: Mac and 3 Grade View: Grade I Tube size: 7.5 mm Number of attempts: 1 Placement Confirmation: ETT inserted  through vocal cords under direct vision Secured at: 25 cm Difficulty Due To: Difficulty was anticipated Future Recommendations: Recommend- induction with short-acting agent, and alternative techniques readily available      (including critical care time)  Medications Ordered in ED Medications  norepinephrine (LEVOPHED) 53m in 2568mpremix infusion (2 mcg/min Intravenous New Bag/Given 08/18/2019 2112)  sodium chloride 0.9 % bolus 1,000 mL (0 mLs Intravenous Stopped 08/21/2019 1909)  methylPREDNISolone sodium succinate (SOLU-MEDROL) 125 mg/2 mL injection 125 mg (125 mg Intravenous Given 07/26/2019 1802)  magnesium sulfate IVPB 2 g 50 mL (0 g Intravenous Stopped 07/25/2019 1909)  vancomycin (VANCOCIN) 1,500 mg in sodium chloride 0.9 % 500 mL IVPB (0 mg Intravenous Stopped 07/30/2019 2109)  piperacillin-tazobactam (ZOSYN) IVPB 3.375 g (0 g Intravenous Stopped 07/25/2019 1942)  EPINEPHrine (ADRENALIN) 1 MG/10ML injection (1 Syringe Intravenous Given 08/21/2019 2051)  0.9 %  sodium chloride infusion (1,000 mLs Intravenous New Bag/Given 07/27/2019 2035)  sodium bicarbonate injection (50 mEq Intravenous Given 07/27/2019 2049)  amiodarone (CORDARONE) 300 mg in dextrose 5 % 100 mL bolus (300 mg Intravenous New Bag/Given 08/08/2019 2051)     Initial Impression / Assessment and Plan / ED Course  I have reviewed the triage vital signs and the nursing notes.  Pertinent labs & imaging results that were available during my care of the patient were reviewed by me and considered in my medical decision making (see chart for details).        [age] M with a chief complaints of what sounds like a syncopal event.  This happened today when the patient was up and walking.  The patient has a history of COPD is supposed to be on 3 L of oxygen at all times.  Wife states that he is not been complaining of shortness of breath but has been using his inhaler much more often over the past few days.  She thinks that may be why his heart is racing.   Patient is a heart rate into the 120s.  On room air the patient's oxygen dips into the upper 80s.  On his home oxygen he is at 100%.  His PCO2 is somewhat elevated though it appears it is only been checked once before in our system and is somewhat similar though at that time he was admitted for a COPD exacerbation.  We will give magnesium and Solu-Medrol.  Check lab work.  Chest x-ray with a possible right-sided pneumonia.  Patient was started on broad-spectrum antibiotics.  I consulted hospitalist for admission.  Unfortunately when the hospital did show up to evaluate the patient he was noted to be pulseless and apneic.  Is unsure how long he was down prior to evaluation.  CPR was started and he had approximately 20 minutes of CPR performed.  He was given multiple boluses of bicarb with the thought that he may become worse singly acidotic due to his respiratory dysfunction.  Initially the patient was in PEA.  He then was in V. fib and shocked x3.  Given amiodarone.  ROSC.   I discussed this with the family.  I discussed how the patient is likely suffered a significant trauma from CPR.  At this  point they would still like him to be a full code however they are planning to discuss this with the family and look at his living will and see if that is what he would still want to have happened.  Discussed with ICU.  Cardiopulmonary Resuscitation (CPR) Procedure Note Directed/Performed by: Cecilio Asper I personally directed ancillary staff and/or performed CPR in an effort to regain return of spontaneous circulation and to maintain cardiac, neuro and systemic perfusion.   CRITICAL CARE Performed by: Cecilio Asper   Total critical care time: 35 minutes  Critical care time was exclusive of separately billable procedures and treating other patients.  Critical care was necessary to treat or prevent imminent or life-threatening deterioration.  Critical care was time spent personally by me  on the following activities: development of treatment plan with patient and/or surrogate as well as nursing, discussions with consultants, evaluation of patient's response to treatment, examination of patient, obtaining history from patient or surrogate, ordering and performing treatments and interventions, ordering and review of laboratory studies, ordering and review of radiographic studies, pulse oximetry and re-evaluation of patient's condition.   The patients results and plan were reviewed and discussed.   Any x-rays performed were independently reviewed by myself.   Differential diagnosis were considered with the presenting HPI.  Medications  norepinephrine (LEVOPHED) 64m in 2586mpremix infusion (2 mcg/min Intravenous New Bag/Given 08/11/2019 2112)  sodium chloride 0.9 % bolus 1,000 mL (0 mLs Intravenous Stopped 07/26/2019 1909)  methylPREDNISolone sodium succinate (SOLU-MEDROL) 125 mg/2 mL injection 125 mg (125 mg Intravenous Given 07/24/2019 1802)  magnesium sulfate IVPB 2 g 50 mL (0 g Intravenous Stopped 07/24/2019 1909)  vancomycin (VANCOCIN) 1,500 mg in sodium chloride 0.9 % 500 mL IVPB (0 mg Intravenous Stopped 08/15/2019 2109)  piperacillin-tazobactam (ZOSYN) IVPB 3.375 g (0 g Intravenous Stopped 08/18/2019 1942)  EPINEPHrine (ADRENALIN) 1 MG/10ML injection (1 Syringe Intravenous Given 08/05/2019 2051)  0.9 %  sodium chloride infusion (1,000 mLs Intravenous New Bag/Given 08/07/2019 2035)  sodium bicarbonate injection (50 mEq Intravenous Given 08/12/2019 2049)  amiodarone (CORDARONE) 300 mg in dextrose 5 % 100 mL bolus (300 mg Intravenous New Bag/Given 07/26/2019 2051)    Vitals:   08/19/2019 2111 08/17/2019 2127 07/25/2019 2151 07/26/2019 2204  BP: 132/87 91/65 102/69 101/79  Pulse: (!) 105   (!) 105  Resp: (!) 32 (!) 22 (!) 25 (!) 26  Temp:      TempSrc:      SpO2: 100%   98%  Weight:      Height: 6' (1.829 m)       Final diagnoses:  COPD exacerbation (HCC)  Syncope and collapse  [facility] [place] Chest [place]  trauma    Admission/ observation were discussed with the admitting physician, patient and/or family and they are comfortable with the plan.    Final Clinical Impressions(s) / ED Diagnoses   Final diagnoses:  [place] Syncope and collapse  Card[facility] ([facility] Chest [facility] trauma    ED Discharge Orders    None       FlDeno EtienneDO 08/16/2019 2205

## 2019-07-31 NOTE — Progress Notes (Signed)
RT changed VT to 460( 6cc) due to increased Peak inspiratory pressure in the high 50s. Patient's minute ventilation stayed the same. RT will obtain ABG in one hour. RT will monitor as needed.

## 2019-07-31 NOTE — ED Triage Notes (Signed)
Pt BIB [facility] for eval of altered mental status episode of unresponsiveness while visiting his wife at an assisted living center. Pt was walking out to his ride and staff was assisting him when his knees buckled and he went to ground. Pt w/ small avulsion to arm from fall. EMS also reports fall 2-3 weeks ago where he fell backwards and struck his head, but was not seen for that. Pt arrives alert and interactive, does follow commands, lethargic. EMS reports ammonia inhalant is what aroused pt.

## 2019-07-31 NOTE — H&P (Addendum)
NAME:  Kyle Mosley, MRN:  [id number], DOB:  [birth year], LOS: 0 ADMISSION DATE:  08/11/2019, CONSULTATION DATE:  07/26/2019 REFERRING MD:  Dr. Tyrone Nine, CHIEF COMPLAINT:  Cardiac arrest   Brief History   84 year old male presenting with syncopal episode, weakness, and lower leg swelling who decompensated in ER, found unresponsive in asystolic cardiac arrest, ROSC after 20 mins.  Developed severe diffuse subcutaneous air during CPR without obvious pneumothorax on CXR.  On vasopressor support for hypotension.  PCCM called for admit.   History of present illness   HPI obtained from medical chart review as patient is unresponsive on mechanical ventilation.    84 year old male with prior history of COPD on home O2/ steroid dependent, asthma, Afib on Xarelto, HFpEF, HTN, HLD, and pulmonary hypertension who presented from home after progressive lower extremity swelling for 3 weeks, using his inhaler more frequently but not complaining of SOB, who became weak today and collapsed.  Possible recent falls at home.    On arrival to ER, patient was alert and oriented.  Denied any chest pain or shortness of breath.  Some hypoxia treated for CAP/ COPD exacerbation in ER with magnesium, solumedrol, nebs, vancomycin, zosyn, and 1 L bolus with CXR showing possible right sided pneumonia.  He was about to admitted and then found unresponsive and in asystole.  ACLS measures performed.  During arrest, patient developed Vtach and defibrillated 3 times with ROSC after 20 mins.  During CPR, staff stated right side of chest developed subcutaneous air then becoming diffuse.  CXR without obvious pneumothorax.  Required levophed for ongoing hypotension.  Neurology remained unresponsive, some movement in upper arms to noxious stimuli.  PCCM called for admit.  COVID pending.   Past Medical History  COPD on home O2/ steroid dependent, HFpEF, Afib on Xarelto, HTN, HLD, pulmonary hypertension, enlarged prostate, anemia of chronic  disease  Significant Hospital Events   9/8 Admit / cardiac arrest  Consults:   Procedures:  9/8 ETT >> 9/8 foley >> 9/8 OGT >>  Significant Diagnostic Tests:  9/8 CTH >> 1. No acute intracranial pathology. 2. Mild age-related atrophy and chronic microvascular ischemic Changes.  9/9 CT chest  >>  Micro Data:  9/8 COVID >> neg 9/8 BCx 2 >> 9/8 trach asp >>  Antimicrobials:  9/8 vanc 9/8 zosyn >>  Interim history/subjective:  Not received any sedation thus far  Objective   Blood pressure 101/79, pulse (!) 105, temperature (!) 97.5 F (36.4 C), temperature source Rectal, resp. rate (!) 26, height 6' (1.829 m), weight 72.1 kg, SpO2 98 %.    Vent Mode: PRVC FiO2 (%):  [60 %-100 %] 60 % Set Rate:  [26 bmp] 26 bmp Vt Set:  [460 mL-510 mL] 460 mL Plateau Pressure:  [28 cmH20-38 cmH20] 38 cmH20  No intake or output data in the 24 hours ending 08/10/2019 2217 Filed Weights   08/16/2019 1721  Weight: 72.1 kg   Examination: General:  Critically ill appearing elderly male with severe diffuse SQ air/ edema HEENT: eyes swollen shut, unable to assess pupils, diffuse SQ air in face and neck, ETT/ OGT Neuro: minimally  Moves upper extremities to noxious stimuli otherwise unresponsive  CV: RR IR, tachy, distant hs PULM:  Tight circumferential chest wall due to SQ air, diminished bs throughout, MV supported breaths, PIP noted to be in 40-50's GI: distended, distant +bs, SQ air, extensive penile/scrotal edema/ SQ air, foley with minimal cyu Extremities: cool/ dry, SQ air extending below  bilateral knees Skin: no rashes, skin tear to left arm  Resolved Hospital Problem list    Assessment & Plan:   Asystolic cardiac arrest w/episode of Vtach s/p defib with ROSC after 20 mins Shock- unclear etiology at this time- could be obstructive given ?PTX vs cardiogenic vs septic  - unclear etiology, concern for syncopal episode and increased falls at home, unclear if patient had been wearing  his O2, was hemodynamically stable and alert prior to arrest.  DDx include acute respiratory decompensation from pneumonia/ AECOPD (although had not acutely complained of SOB), cardiac event/ arrhythmia- EKG in ER w/sinus tachycardia, or given history of pulmonary hypertension, if pulmonary hypertension is severe, IVF could have further caused RV stress leading to cardiac collapse (last 2 TTE have not commented on PAP but noted for reduced systolic RV function and elevated RSVP with dilated IVC, no RHC noted).  CTH was negative.   P:  Tele/ ICU monitoring Resend labs- CBC, CMET, coags, mag/ phos Continue levophed for MAP goal >65, weaning pressor requirements since CT placement attempted, leaning more obstructive etiology, will hold on CVL placement for now  Trend lactate Pan culture Assess/ trend PCT Continue nosocomial abx coverage given admission in August 2020 EKG and hs-trop x 3 q 6hr TTE in am    Acute on chronic hypoxic respiratory failure Possible AECOPD- appears have bullous emphysema with large right upper bleb at baseline Concern for Right sided pneumothorax r/t possible bleb rupture vs trauma from CPR R/o rib fx from prolonged CPR  CAP vs ?aspiration P:  Full MV support PRVC 6 cc/kg, rate 22 Wean FiO2 for goal sat 88-94%/ minimize PEEP while monitoring PIP/ driving pressures  Trend CXR/ ABG S/p attempt at bedside chest tube placement- malpositioned, therefore removed.  See Dr. Duwayne Heck note for details.  Much improvement in PIP, sats, improved BP with decreased levophed requirement, and release of SQ air with procedure.   S/p CT chest- pending official read TCTS consulted for assistance with CT placement given large bleb duonebs q 4, prn albuterol Solumedrol 40mg  q 12hr abx as above  Checking urine strep/ legionella   Acute encephalopathy following prolonged cardiac arrest - neuro intact prior to arrest with neg CTH P:  TTM at 36 degrees  Ongoing neuro exams EEG in am   PAD protocol   Hx Afib on Xarelto P:  Currently sinus on monitor Hold xarelto/ systemic anticoagulation at this time Goal Mag > 2, K > 4 Hold home cardizem  Hx HTN, HFpEF P:  TTE as above Currently hypotensive on pressors.  Difficult to assess volume status given diffuse SQ air extending below knees Hold home lasix  Anemia of chronic disease  P:  CBC stable, monitor given on home Xarelto with prolonged CPR  Transfuse for Hgb < 7  At risk AKI P:  BMET q 12 per TTM Continue foley  Trend UOP/ daily weights   Best practice:  Diet: NPO Pain/Anxiety/Delirium protocol (if indicated): prn fentanyl /versed VAP protocol (if indicated): yes DVT prophylaxis: heparin SQ GI prophylaxis: PPI Glucose control: SSI Mobility: BR Code Status: Full, prognosis is guarded  Family Communication: Dr. Duwayne Heck updated wife by phone Disposition:  ICU   Labs   CBC: Recent Labs  Lab 08/05/2019 1726 07/25/2019 1755  WBC 4.8  --   NEUTROABS 3.8  --   HGB 9.0* 10.5*  HCT 32.2* 31.0*  MCV 101.9*  --   PLT 241  --     Basic Metabolic Panel: Recent Labs  Lab 08/03/2019 1726 08/15/2019 1755  NA 143 141  K 4.4 4.4  CL 95*  --   CO2 39*  --   GLUCOSE 97  --   BUN 21  --   CREATININE 0.88  --   CALCIUM 8.3*  --    GFR: Estimated Creatinine Clearance: 66 mL/min (by C-G formula based on SCr of 0.88 mg/dL). Recent Labs  Lab 08/10/2019 1726 08/19/2019 1735  WBC 4.8  --   LATICACIDVEN  --  1.8    Liver Function Tests: No results for input(s): AST, ALT, ALKPHOS, BILITOT, PROT, ALBUMIN in the last 168 hours. No results for input(s): LIPASE, AMYLASE in the last 168 hours. No results for input(s): AMMONIA in the last 168 hours.  ABG    Component Value Date/Time   PHART 7.254 (L) 08/20/2019 2136   PCO2ART 97.1 (HH) 08/17/2019 2136   PO2ART 212 (H) 08/17/2019 2136   HCO3 41.9 (H) 08/11/2019 2136   TCO2 46 (H) 08/14/2019 1755   O2SAT 99.1 07/30/2019 2136     Coagulation Profile: No  results for input(s): INR, PROTIME in the last 168 hours.  Cardiac Enzymes: No results for input(s): CKTOTAL, CKMB, CKMBINDEX, TROPONINI in the last 168 hours.  HbA1C: Hgb A1c MFr Bld  Date/Time Value Ref Range Status  06/30/2019 01:31 PM 5.5 4.8 - 5.6 % Final    Comment:    (NOTE) Pre diabetes:          5.7%-6.4% Diabetes:              >6.4% Glycemic control for   <7.0% adults with diabetes   02/18/2017 05:01 AM 5.8 (H) 4.8 - 5.6 % Final    Comment:    (NOTE)         Pre-diabetes: 5.7 - 6.4         Diabetes: >6.4         Glycemic control for adults with diabetes: <7.0     CBG: Recent Labs  Lab 08/20/2019 1720  GLUCAP 86    Review of Systems:   Unable   Past Medical History  He,  has a past medical history of Asthma, Atrial fibrillation ([place]) (03/31/2019), Colon polyps, COPD (chronic obstructive pulmonary disease) ([place]), Diverticulosis, Enlarged prostate, Hyperlipidemia, Hypertension, Prediabetes (2011), and Pulmonary hypertension ([place]) (2012).   Surgical History    Past Surgical History:  Procedure Laterality Date   COLONOSCOPY  03/21/2007   Dr. Jonny Ruiz diverticula, normal rectum   COLONOSCOPY  05/04/2012   Procedure: COLONOSCOPY;  Surgeon: Daneil Dolin, MD;  Location: [facility];  Service: Endoscopy;  Laterality: N/A;  7:30   PROSTATE BIOPSY       Social History   reports that he quit smoking about 22 years ago. He has a 25.00 pack-year smoking history. He has never used smokeless tobacco. He reports that he does not drink alcohol or use drugs.   Family History   His family history includes Alcohol abuse in his mother; COPD in his sister; Heart attack in his father; Hypertension in his brother.   Allergies No Known Allergies   Home Medications  Prior to Admission medications   Medication Sig Start Date End Date Taking? Authorizing Provider  albuterol (PROVENTIL) (2.5 MG/3ML) 0.083% nebulizer solution Take 3 mLs (2.5 mg total) by nebulization 3  (three) times daily. 07/03/19  Yes Johnson, Clanford L, MD  albuterol (VENTOLIN HFA) 108 (90 Base) MCG/ACT inhaler Inhale 2 puffs into the lungs every 6 (six) hours as needed  for wheezing or shortness of breath. 02/09/18  Yes Fayrene Helper, MD  diltiazem (CARDIZEM CD) 120 MG 24 hr capsule Take 1 capsule (120 mg total) by mouth daily. 07/03/19  Yes Johnson, Clanford L, MD  Fluticasone-Salmeterol (ADVAIR DISKUS) 250-50 MCG/DOSE AEPB Inhale 1 puff into the lungs 2 (two) times daily. 03/31/19 08/01/2019 Yes Fayrene Helper, MD  furosemide (LASIX) 20 MG tablet Take 1 tablet (20 mg total) by mouth daily. 07/03/19  Yes Johnson, Clanford L, MD  ipratropium (ATROVENT) 0.02 % nebulizer solution Take 2.5 mLs (0.5 mg total) by nebulization 3 (three) times daily. 07/03/19  Yes Johnson, Clanford L, MD  mirtazapine (REMERON) 7.5 MG tablet TAKE 1 TABLET AT BEDTIME Patient taking differently: Take 7.5 mg by mouth at bedtime.  06/25/19  Yes Fayrene Helper, MD  pantoprazole (PROTONIX) 20 MG tablet Take 1 tablet (20 mg total) by mouth daily. 03/29/19  Yes Fayrene Helper, MD  tamsulosin (FLOMAX) 0.4 MG CAPS capsule TAKE 1 CAPSULE  DAILY AFTER SUPPER. Patient taking differently: Take 0.4 mg by mouth daily after supper.  06/25/19  Yes Fayrene Helper, MD  KLOR-CON M20 20 MEQ tablet Take 20 mEq by mouth daily. 07/03/19   [provider]  potassium chloride SA (K-DUR) 10 MEQ tablet Take 1 tablet (10 mEq total) by mouth daily. Patient not taking: Reported on 08/21/2019 07/03/19   Murlean Iba, MD  rivaroxaban (XARELTO) 20 MG TABS tablet Take 1 tablet (20 mg total) by mouth daily with supper. Patient not taking: Reported on 08/18/2019 04/05/19   Fayrene Helper, MD     Critical care time: 24 mins    Kennieth Rad, MSN, AGACNP-BC [facility] Pgr: [phone] or if no answer [phone] 08/01/2019, 1:24 AM

## 2019-07-31 NOTE — ED Notes (Signed)
Foley placed.

## 2019-08-01 ENCOUNTER — Inpatient Hospital Stay (HOSPITAL_COMMUNITY): Payer: Medicare HMO

## 2019-08-01 DIAGNOSIS — J9622 Acute and chronic respiratory failure with hypercapnia: Secondary | ICD-10-CM

## 2019-08-01 DIAGNOSIS — R609 Edema, unspecified: Secondary | ICD-10-CM

## 2019-08-01 DIAGNOSIS — I469 Cardiac arrest, cause unspecified: Secondary | ICD-10-CM

## 2019-08-01 DIAGNOSIS — J9621 Acute and chronic respiratory failure with hypoxia: Secondary | ICD-10-CM

## 2019-08-01 DIAGNOSIS — J939 Pneumothorax, unspecified: Secondary | ICD-10-CM

## 2019-08-01 DIAGNOSIS — J441 Chronic obstructive pulmonary disease with (acute) exacerbation: Secondary | ICD-10-CM

## 2019-08-01 DIAGNOSIS — J9311 Primary spontaneous pneumothorax: Secondary | ICD-10-CM

## 2019-08-01 LAB — PROTIME-INR
INR: 1.4 — ABNORMAL HIGH (ref 0.8–1.2)
Prothrombin Time: 16.7 seconds — ABNORMAL HIGH (ref 11.4–15.2)

## 2019-08-01 LAB — CBC
HCT: 34.6 % — ABNORMAL LOW (ref 39.0–52.0)
Hemoglobin: 10.2 g/dL — ABNORMAL LOW (ref 13.0–17.0)
MCH: 29.1 pg (ref 26.0–34.0)
MCHC: 29.5 g/dL — ABNORMAL LOW (ref 30.0–36.0)
MCV: 98.9 fL (ref 80.0–100.0)
Platelets: 221 10*3/uL (ref 150–400)
RBC: 3.5 MIL/uL — ABNORMAL LOW (ref 4.22–5.81)
RDW: 14.7 % (ref 11.5–15.5)
WBC: 19.8 10*3/uL — ABNORMAL HIGH (ref 4.0–10.5)
nRBC: 0 % (ref 0.0–0.2)

## 2019-08-01 LAB — GLUCOSE, CAPILLARY
Glucose-Capillary: 79 mg/dL (ref 70–99)
Glucose-Capillary: 83 mg/dL (ref 70–99)
Glucose-Capillary: 72 mg/dL (ref 70–99)
Glucose-Capillary: 79 mg/dL (ref 70–99)
Glucose-Capillary: 85 mg/dL (ref 70–99)
Glucose-Capillary: 99 mg/dL (ref 70–99)

## 2019-08-01 LAB — TROPONIN I (HIGH SENSITIVITY): Troponin I (High Sensitivity): 193 ng/L (ref <18)

## 2019-08-01 LAB — PHOSPHORUS: Phosphorus: 4.1 mg/dL (ref 2.5–4.6)

## 2019-08-01 LAB — APTT: aPTT: 25 seconds (ref 24–36)

## 2019-08-01 LAB — LACTIC ACID, PLASMA: Lactic Acid, Venous: 1.6 mmol/L (ref 0.5–1.9)

## 2019-08-01 LAB — SARS CORONAVIRUS 2 BY RT PCR (HOSPITAL ORDER, PERFORMED IN ~~LOC~~ HOSPITAL LAB): SARS Coronavirus 2: NEGATIVE

## 2019-08-01 LAB — MAGNESIUM: Magnesium: 2.3 mg/dL (ref 1.7–2.4)

## 2019-08-01 LAB — STREP PNEUMONIAE URINARY ANTIGEN: Strep Pneumo Urinary Antigen: NEGATIVE

## 2019-08-01 LAB — PROCALCITONIN: Procalcitonin: 0.52 ng/mL

## 2019-08-01 MED ORDER — SODIUM CHLORIDE 0.9 % IV SOLN: INTRAVENOUS | Status: DC

## 2019-08-01 MED ORDER — FENTANYL CITRATE (PF) 100 MCG/2ML IJ SOLN: 25.0000 ug | Freq: Once | INTRAMUSCULAR | Status: DC

## 2019-08-01 MED ORDER — FENTANYL CITRATE (PF) 100 MCG/2ML IJ SOLN
25.0000 ug | INTRAMUSCULAR | Status: DC | PRN
  Administered 2019-08-01: 50 ug via INTRAVENOUS
  Filled 2019-08-01 (×2): qty 2

## 2019-08-01 MED ORDER — VANCOMYCIN HCL 10 G IV SOLR
1500.0000 mg | INTRAVENOUS | Status: DC
  Administered 2019-08-01: 19:00:00 1500 mg via INTRAVENOUS
  Filled 2019-08-01 (×2): qty 1500

## 2019-08-01 MED ORDER — FENTANYL 2500MCG IN NS 250ML (10MCG/ML) PREMIX INFUSION
25.0000 ug/h | INTRAVENOUS | Status: DC
  Administered 2019-08-01: 09:00:00 25 ug/h via INTRAVENOUS
  Administered 2019-08-01: 125 ug/h via INTRAVENOUS
  Filled 2019-08-01 (×2): qty 250

## 2019-08-01 MED ORDER — ASPIRIN 81 MG PO CHEW
81.0000 mg | CHEWABLE_TABLET | Freq: Every day | ORAL | Status: DC
  Administered 2019-08-02 – 2019-08-05 (×3): 81 mg
  Filled 2019-08-01 (×3): qty 1

## 2019-08-01 MED ORDER — DOCUSATE SODIUM 50 MG/5ML PO LIQD: 100.0000 mg | Freq: Two times a day (BID) | ORAL | Status: DC | PRN

## 2019-08-01 MED ORDER — SODIUM CHLORIDE 0.9 % IV SOLN
2.0000 g | Freq: Three times a day (TID) | INTRAVENOUS | Status: DC
  Administered 2019-08-01 – 2019-08-06 (×16): 2 g via INTRAVENOUS
  Filled 2019-08-01 (×18): qty 2

## 2019-08-01 MED ORDER — HEPARIN SODIUM (PORCINE) 5000 UNIT/ML IJ SOLN
5000.0000 [IU] | Freq: Three times a day (TID) | INTRAMUSCULAR | Status: DC
  Administered 2019-08-01: 5000 [IU] via SUBCUTANEOUS
  Filled 2019-08-01: qty 1

## 2019-08-01 MED ORDER — METOPROLOL TARTRATE 5 MG/5ML IV SOLN
2.5000 mg | Freq: Four times a day (QID) | INTRAVENOUS | Status: DC
  Administered 2019-08-02 (×3): 2.5 mg via INTRAVENOUS
  Filled 2019-08-01 (×5): qty 5

## 2019-08-01 MED ORDER — MIDAZOLAM HCL 2 MG/2ML IJ SOLN: 1.0000 mg | INTRAMUSCULAR | Status: DC | PRN

## 2019-08-01 MED ORDER — PANTOPRAZOLE SODIUM 40 MG IV SOLR
40.0000 mg | Freq: Every day | INTRAVENOUS | Status: DC
  Administered 2019-08-01 – 2019-08-05 (×5): 40 mg via INTRAVENOUS
  Filled 2019-08-01 (×5): qty 40

## 2019-08-01 MED ORDER — FENTANYL BOLUS VIA INFUSION
25.0000 ug | INTRAVENOUS | Status: DC | PRN
  Administered 2019-08-03: 25 ug via INTRAVENOUS
  Filled 2019-08-01: qty 25

## 2019-08-01 MED ORDER — CHLORHEXIDINE GLUCONATE 0.12% ORAL RINSE (MEDLINE KIT)
15.0000 mL | Freq: Two times a day (BID) | OROMUCOSAL | Status: DC
  Administered 2019-08-01 – 2019-08-03 (×5): 15 mL via OROMUCOSAL

## 2019-08-01 MED ORDER — ORAL CARE MOUTH RINSE
15.0000 mL | OROMUCOSAL | Status: DC
  Administered 2019-08-01 – 2019-08-03 (×23): 15 mL via OROMUCOSAL

## 2019-08-01 MED ORDER — HEPARIN (PORCINE) 25000 UT/250ML-% IV SOLN
1350.0000 [IU]/h | INTRAVENOUS | Status: DC
  Administered 2019-08-01: 17:00:00 800 [IU]/h via INTRAVENOUS
  Administered 2019-08-02: 1100 [IU]/h via INTRAVENOUS
  Administered 2019-08-03: 1350 [IU]/h via INTRAVENOUS
  Filled 2019-08-01 (×3): qty 250

## 2019-08-01 MED ORDER — FENTANYL CITRATE (PF) 100 MCG/2ML IJ SOLN: 25.0000 ug | INTRAMUSCULAR | Status: DC | PRN

## 2019-08-01 MED ORDER — BISACODYL 10 MG RE SUPP: 10.0000 mg | Freq: Every day | RECTAL | Status: DC | PRN

## 2019-08-01 MED ORDER — DEXTROSE-NACL 5-0.9 % IV SOLN
INTRAVENOUS | Status: DC
  Administered 2019-08-01 – 2019-08-02 (×2): via INTRAVENOUS
  Administered 2019-08-02: 500 mL via INTRAVENOUS

## 2019-08-01 MED ORDER — CHLORHEXIDINE GLUCONATE CLOTH 2 % EX PADS
6.0000 | MEDICATED_PAD | Freq: Every day | CUTANEOUS | Status: DC
  Administered 2019-08-01 – 2019-08-06 (×5): 6 via TOPICAL

## 2019-08-01 MED ORDER — MIDAZOLAM HCL 2 MG/2ML IJ SOLN
1.0000 mg | INTRAMUSCULAR | Status: DC | PRN
  Administered 2019-08-01 (×2): 1 mg via INTRAVENOUS
  Filled 2019-08-01 (×2): qty 2

## 2019-08-01 MED FILL — Medication: Qty: 1 | Status: AC

## 2019-08-01 NOTE — Progress Notes (Signed)
Lower extremity venous has been completed.   Preliminary results in CV Proc.   Abram Sander 08/01/2019 2:34 PM

## 2019-08-01 NOTE — Telephone Encounter (Signed)
Sent a msg to [employer] at advanced to see if a Education officer, museum could assist with this but pt is currently hospitalized

## 2019-08-01 NOTE — Procedures (Signed)
Chest Tube Insertion Procedure Note  Indications:  Clinically significant Pneumothorax and Subcutaneous emphysema  Pre-operative Diagnosis: Pneumothorax, extensive subcutaneous emphysema.   Post-operative Diagnosis: Pneumothorax  Procedure Details  Informed consent was obtained for the procedure, including sedation.  Risks of lung perforation, hemorrhage, arrhythmia, and adverse drug reaction were discussed. Discussed with wife Parke Simmers via phone  After sterile skin prep, using standard technique, a 30 French tube was placed in the right lateral  4 th rib space. 10cc Lidocaine initially used for local anesthesia. Large volume air escape after initial skin incision.  Peak pressures immediately went from 55 to 38, oxygen saturation improved.   Blunt dissection to 4th intercostal space done with curved kelly. 68 F chest tube placed, with air leak and condensation of the tube noted on respiratory cycle.  Tube sutured in place.    CXR showed tube actually in subcu space, removed and additional suture placed by Kennieth Rad NP.      Findings: None  Estimated Blood Loss:  Minimal         Specimens:  None              Complications:  None; patient tolerated the procedure well. Chest tube found to be placed outside of chest wall, removed.          Disposition: ICU - intubated and critically ill.         Condition: stable  Attending Attestation: I was present and scrubbed for the entire procedure..  CT does not show clear PTX.  CT chest ordered.

## 2019-08-01 NOTE — Progress Notes (Signed)
  Echocardiogram 2D Echocardiogram has been attempted. No acoustic windows or sonographic images. Two sonographers attempted echo. Different imaging modality suggested.  Devona Holmes G Sokha Craker 08/01/2019, 3:27 PM

## 2019-08-01 NOTE — Progress Notes (Signed)
      [address]       [place] [address]             [phone]      Intubated, sedated  Extensive SQ emphysema, but decreased from when I saw him last night Large air leak  EXAM: PORTABLE CHEST 1 VIEW  COMPARISON:  08/01/2019  FINDINGS: Cardiac shadow is stable. Endotracheal tube and gastric catheter are seen and stable. Right-sided chest tube is again noted. Bullous disease is again noted in the right apex. No definitive pneumothorax is seen. Considerable subcutaneous emphysema is noted. Patchy increased density is noted throughout the right lung. This appears mainly related to posterior effusion and infiltrate seen on recent chest CT. Multiple right-sided rib fractures are noted similar to that seen on prior CT.  IMPRESSION: Stable changes following chest tube placement. Persistent bulla is noted in the right apex. No definitive pneumothorax is seen.  Significant subcutaneous emphysema.   Electronically Signed   By: Inez Catalina M.D.   On: 08/01/2019 08:02 I personally reviewed the CXR and concur with the findings noted above  VDRF in setting of OOH cardiac arrest- vent per CCM Pneumothorax, rib fractures post CPR- keep CT to suction Subcutaneous emphysema- extensive, will take days to resolve  Remo Lipps C. Roxan Hockey, MD [facility] [phone]

## 2019-08-01 NOTE — Procedures (Signed)
Central Venous Catheter Insertion Procedure Note Kyle Mosley [id number] [birth year]  Procedure: Insertion of Central Venous Catheter Indications: Assessment of intravascular volume, Drug and/or fluid administration and Frequent blood sampling  Procedure Details Consent: Risks of procedure as well as the alternatives and risks of each were explained to the (patient/caregiver).  Consent for procedure obtained. Time Out: Verified patient identification, verified procedure, site/side was marked, verified correct patient position, special equipment/implants available, medications/allergies/relevent history reviewed, required imaging and test results available.  Performed  Maximum sterile technique was used including antiseptics, cap, gloves, gown, hand hygiene, mask and sheet. Skin prep: Chlorhexidine; local anesthetic administered A antimicrobial bonded/coated triple lumen catheter was placed in the left femoral vein due to multiple attempts, no other available access using the Seldinger technique.  Due to extensive subcutaneous emphysema, extremely poor visualization of vessels of subclavian and IJ (bilateral sides).  Evaluation Blood flow good Complications: No apparent complications Patient did tolerate procedure well. Chest X-ray ordered to verify placement.  CXR: not needed.  Procedure performed under direct ultrasound guidance for real time vessel cannulation.      Kyle Mosley, [facility] Pager: [phone]  or [phone] 08/01/2019, 12:37 PM

## 2019-08-01 NOTE — Progress Notes (Signed)
NAME:  Kyle Mosley, MRN:  [id number], DOB:  [birth year], LOS: 1 ADMISSION DATE:  08/05/2019, CONSULTATION DATE:  08/17/2019 REFERRING MD:  Dr. Tyrone Nine, CHIEF COMPLAINT:  Cardiac arrest   Brief History   84 year old male presenting with syncopal episode, weakness, and lower leg swelling who decompensated in ER, found unresponsive in asystolic cardiac arrest, ROSC after 20 mins.  Developed severe diffuse subcutaneous air during CPR without obvious pneumothorax on CXR.  On vasopressor support for hypotension.  PCCM called for admit.   History of present illness   HPI obtained from medical chart review as patient is unresponsive on mechanical ventilation.    84 year old male with prior history of COPD on home O2/ steroid dependent, asthma, Afib on Xarelto, HFpEF, HTN, HLD, and pulmonary hypertension who presented from home after progressive lower extremity swelling for 3 weeks, using his inhaler more frequently but not complaining of SOB, who became weak today and collapsed.  Possible recent falls at home.    On arrival to ER, patient was alert and oriented.  Denied any chest pain or shortness of breath.  Some hypoxia treated for CAP/ COPD exacerbation in ER with magnesium, solumedrol, nebs, vancomycin, zosyn, and 1 L bolus with CXR showing possible right sided pneumonia.  He was about to admitted and then found unresponsive and in asystole.  ACLS measures performed.  During arrest, patient developed Vtach and defibrillated 3 times with ROSC after 20 mins.  During CPR, staff stated right side of chest developed subcutaneous air then becoming diffuse.  CXR without obvious pneumothorax.  Required levophed for ongoing hypotension.  Neurologically, remained unresponsive, some movement in upper arms to noxious stimuli.  PCCM called for admit.    Past Medical History  COPD on home O2/ steroid dependent, HFpEF, Afib on Xarelto, HTN, HLD, pulmonary hypertension, enlarged prostate, anemia of chronic disease   Significant Hospital Events   9/8 Admit / cardiac arrest  Consults:  CT surgery  Procedures:  9/8 ETT >> 9/8 foley >> 9/8 OGT >>  Significant Diagnostic Tests:  9/8 CTH >> 1. No acute intracranial pathology. 2. Mild age-related atrophy and chronic microvascular ischemic Changes.  9/9 CT chest  >>moderate R hydropneumonothorax, extensive subcutaneous emphysema, possible tear in anterior costochondral junction, patchy RLL opacity  Micro Data:  9/8 COVID >> neg 9/8 BCx 2 >> 9/8 trach asp >>  Antimicrobials:  9/8 vanc>> 9/8 Cefepime>>  Interim history/subjective:  Non-purposeful movement overnight, slightly agitated per nursing  Objective   Blood pressure 106/70, pulse 87, temperature (!) 96.6 F (35.9 C), temperature source Bladder, resp. rate 18, height 6' (1.829 m), weight 73.9 kg, SpO2 100 %.    Vent Mode: PRVC FiO2 (%):  [60 %-100 %] 60 % Set Rate:  [22 bmp-26 bmp] 22 bmp Vt Set:  [460 mL-510 mL] 460 mL PEEP:  [5 cmH20] 5 cmH20 Plateau Pressure:  [9 cmH20-38 cmH20] 9 cmH20   Intake/Output Summary (Last 24 hours) at 08/01/2019 0823 Last data filed at 08/01/2019 0800 Gross per 24 hour  Intake 221.1 ml  Output 475 ml  Net -253.9 ml   Filed Weights   08/18/2019 1721 08/01/19 0523  Weight: 72.1 kg 73.9 kg   General:  Elderly, chronically ill appearing M, intubated  HEENT: MM pink/moist Neuro: non-purposeful movement, withdraws to painful stimuli, PEERLA, not responsive CV: s1s2 RRR, no m/r/g PULM:  R chest tube in place, intubated, no rhonchi or wheezing GI: soft, bsx4 active  Extremities: warm/dry, no LE edema,  subcutaneous emphysema and  Ecchymosis of the bilateral upper extremities Skin: no rashes or lesions, extensive subcutaneous air on the R, scrotal edema   Resolved Hospital Problem list    Assessment & Plan:   Asystolic Cardiac Arrest with subsequent Vtach probable cardiogenic shock  -Pt came into the ED awake and mildly hypoxic and was being treated  for COPD exacerbation, at some point coded, Per hospitalist note walked into find patient unresponsive and pulseless, unknown down-time -ROSC after 20 minutes of CPR and defibrillation, thus far poor neurologic recovery -unclear etiology for arrest, possibly ruptured bleb leading to PTX vs PE -EKG without STEMI, troponin 193 today -CT head negative, CT chest (no contrast)  P: -Continue TTM 36 degrees, re-warming tomorrow -Echocardiogram pending -now off Levophed, continue full vent support -trend troponin -Pt presented with syncope, hypoxia and then cardiac arrest-CT chest done without contrast so consider PE, will doppler LE and empirically start heparin   Acute hypoxic respiratory failure with R-sided pneumothorax and infiltrate -R chest tube place by CT surgery -Underlying COPD on baseline 3L  -R-sided infiltrate on CT -no wheezing on exam P: -Continue duonebs, but stop solumedrol -Continue Vanc/cefepime, await culture results -Start heparin, empiric treatment for PE as possible etiology of syncope and cardiac arrest -Continue full vent support with VAP protocol -Wean FiO2 for goal sat 88-94%/ minimize PEEP while monitoring PIP/ driving pressures     Acute encephalopathy following cardiac arrest -Spoke with son who reports at baseline, pt has mild memory issues, but lives independently with wife and completes ADL's, baseline oriented -CT head without acute findings P -minimize sedation, requiring fentanyl gtt and prn versed for agitation, monitor for neurologic improvement -Prognosis is guarded     History of Atrial fibrillation P:  Currently sinus on monitor Hold xarelto/ systemic anticoagulation at this time Goal Mag > 2, K > 4 Hold home cardizem  Hx HTN, HFpEF P:  -Echo pending -Off pressors, continue home metoprolol   Extensive subcutaneous air and UE skin weeping -bandages to arms, poor peripheral IV access P: -wound care -Place central line  Best  practice:  Diet: NPO Pain/Anxiety/Delirium protocol (if indicated): prn fentanyl /versed VAP protocol (if indicated): yes DVT prophylaxis: heparin SQ GI prophylaxis: PPI Glucose control: SSI Mobility: BR Code Status: Full, prognosis is guarded  Family Communication: Spoke with son at the bedside, seems to understand guarded prognosis, would like to continue aggressive care at this time  Disposition:  ICU   Labs   CBC: Recent Labs  Lab 07/26/2019 1726 07/30/2019 1755 08/22/2019 2344 08/01/19 0452  WBC 4.8  --   --  19.8*  NEUTROABS 3.8  --   --   --   HGB 9.0* 10.5* 10.5* 10.2*  HCT 32.2* 31.0* 31.0* 34.6*  MCV 101.9*  --   --  98.9  PLT 241  --   --  [id number]    Basic Metabolic Panel: Recent Labs  Lab 07/30/2019 1726 08/18/2019 1755 08/08/2019 2344 08/01/19 0452  NA 143 141 146*  --   K 4.4 4.4 4.6  --   CL 95*  --   --   --   CO2 39*  --   --   --   GLUCOSE 97  --   --   --   BUN 21  --   --   --   CREATININE 0.88  --   --   --   CALCIUM 8.3*  --   --   --  MG  --   --   --  2.3  PHOS  --   --   --  4.1   GFR: Estimated Creatinine Clearance: 67.6 mL/min (by C-G formula based on SCr of 0.88 mg/dL). Recent Labs  Lab 08/14/2019 1726 07/25/2019 1735 08/01/19 0450 08/01/19 0452  PROCALCITON  --   --   --  0.52  WBC 4.8  --   --  19.8*  LATICACIDVEN  --  1.8 1.6  --     Liver Function Tests: No results for input(s): AST, ALT, ALKPHOS, BILITOT, PROT, ALBUMIN in the last 168 hours. No results for input(s): LIPASE, AMYLASE in the last 168 hours. No results for input(s): AMMONIA in the last 168 hours.  ABG    Component Value Date/Time   PHART 7.412 08/07/2019 2344   PCO2ART 65.1 (HH) 08/20/2019 2344   PO2ART 80.0 (L) 08/21/2019 2344   HCO3 41.7 (H) 08/11/2019 2344   TCO2 44 (H) 07/25/2019 2344   O2SAT 96.0 08/21/2019 2344     Coagulation Profile: Recent Labs  Lab 08/01/19 0429  INR 1.4*    Cardiac Enzymes: No results for input(s): CKTOTAL, CKMB, CKMBINDEX,  TROPONINI in the last 168 hours.  HbA1C: Hgb A1c MFr Bld  Date/Time Value Ref Range Status  06/30/2019 01:31 PM 5.5 4.8 - 5.6 % Final    Comment:    (NOTE) Pre diabetes:          5.7%-6.4% Diabetes:              >6.4% Glycemic control for   <7.0% adults with diabetes   02/18/2017 05:01 AM 5.8 (H) 4.8 - 5.6 % Final    Comment:    (NOTE)         Pre-diabetes: 5.7 - 6.4         Diabetes: >6.4         Glycemic control for adults with diabetes: <7.0     CBG: Recent Labs  Lab 08/21/2019 1720 08/01/19 0306 08/01/19 0704 08/01/19 0748  GLUCAP 86 79 83 72     CRITICAL CARE Performed by: Otilio Carpen Jazilyn Siegenthaler   Total critical care time: 60 minutes  Critical care time was exclusive of separately billable procedures and treating other patients.  Critical care was necessary to treat or prevent imminent or life-threatening deterioration secondary to cardiac arrest, respiratory failure, pneumothorax and PNA.  Critical care was time spent personally by me on the following activities: development of treatment plan with patient and/or surrogate as well as nursing, discussions with consultants, evaluation of patient's response to treatment, examination of patient, obtaining history from patient or surrogate, ordering and performing treatments and interventions, ordering and review of laboratory studies, ordering and review of radiographic studies, pulse oximetry and re-evaluation of patient's condition.   Otilio Carpen Shuntell Foody, PA-C [place] [facility]  Pager# [phone], if no answer [phone]

## 2019-08-01 NOTE — Progress Notes (Signed)
Transported patient on vent from ED-26 to [id number] without complication.

## 2019-08-01 NOTE — Progress Notes (Signed)
Initial Nutrition Assessment  DOCUMENTATION CODES:   Not applicable  INTERVENTION:   Tube feeding recommendations: - Vital AF 1.2 @ 45 ml/hr (1080 ml/day) via OG tube - Pro-stat 30 ml BID  Recommended tube feeding regimen provides 1496 kcal, 111 grams of protein, and 876 ml of H2O.   NUTRITION DIAGNOSIS:   Inadequate oral intake related to inability to eat as evidenced by NPO status.  GOAL:   Patient will meet greater than or equal to 90% of their needs  MONITOR:   Vent status, Weight trends, Labs, I & O's, Skin  REASON FOR ASSESSMENT:   Ventilator    ASSESSMENT:   84 year old male who presented on 9/08 with AMS after a fall. PMH of atrial fibrillation, COPD, HLD, HTN, dementia. Pt experienced cardiac arrest in the ED and CPR was initiated with ROSC after 20 minutes. Pt developed multiple anterior rib fractures, pneumothorax, and extensive subcutaneous emphysema. TTM 36 degrees initiated. Chest tube placed.   Discussed pt with RN and ICU rounds. Pt will begin rewarming tomorrow. Spoke with MD, will hold on starting TF pending [place] discussions. RD to leave TF recommendations.  RN placing OG tube at time of visit. Awaiting x-ray confirmation of placement.  Reviewed weight history in chart. Pt's weight has been fairly stable over the last 1 year.  Patient is currently intubated on ventilator support MV: 4.0 L/min Temp (24hrs), Avg:96.8 F (36 C), Min:95.9 F (35.5 C), Max:98.1 F (36.7 C) BP (a-line): 94/47 MAP (a-line): 64  Drips: Fentanyl: 15 ml/hr NS: 10 ml/hr D5 in NS: 50 ml/hr  Medications reviewed and include: Solu-medrol, Protonix, IV abx  Labs reviewed: sodium 146, calcium ionized 1.06 CBG's: 72-86 x 24 hours  NUTRITION - FOCUSED PHYSICAL EXAM:    Most Recent Value  Orbital Region  Mild depletion  Upper Arm Region  Mild depletion  Thoracic and Lumbar Region  No depletion  Buccal Region  No depletion  Temple Region  No depletion  Clavicle Bone  Region  Mild depletion  Clavicle and Acromion Bone Region  Mild depletion  Scapular Bone Region  Unable to assess  Dorsal Hand  No depletion  Patellar Region  No depletion  Anterior Thigh Region  No depletion  Posterior Calf Region  No depletion  Edema (RD Assessment)  Moderate [generalized]  Hair  Reviewed  Eyes  Reviewed  Mouth  Reviewed  Skin  Reviewed  Nails  Reviewed       Diet Order:   Diet Order            Diet NPO time specified  Diet effective now              EDUCATION NEEDS:   No education needs have been identified at this time  Skin:  Skin Assessment: Reviewed RN Assessment  Last BM:  no documented BM  Height:   Ht Readings from Last 1 Encounters:  07/30/2019 6' (1.829 m)    Weight:   Wt Readings from Last 1 Encounters:  08/01/19 73.9 kg    Ideal Body Weight:  80.9 kg  BMI:  Body mass index is 22.1 kg/m.  Estimated Nutritional Needs:   Kcal:  1497  Protein:  95-110 grams  Fluid:  >/= 1.5 L    Gaynell Face, MS, RD, LDN Inpatient Clinical Dietitian Pager: [phone] Weekend/After Hours: [phone]

## 2019-08-01 NOTE — Progress Notes (Signed)
ANTICOAGULATION CONSULT NOTE - Initial Consult  Pharmacy Consult for heparin Indication: pulmonary embolus  No Known Allergies  Patient Measurements: Height: 6' (182.9 cm) Weight: 162 lb 14.7 oz (73.9 kg) IBW/kg (Calculated) : 77.6 Heparin Dosing Weight: 74kg  Vital Signs: Temp: 96.4 F (35.8 C) (09/09 1200) Temp Source: Bladder (09/09 1200) BP: 90/49 (09/09 1200) Pulse Rate: 90 (09/09 1300)  Labs: Recent Labs    07/29/2019 1726 08/09/2019 1755 07/27/2019 2344 08/01/19 0429 08/01/19 0452  HGB 9.0* 10.5* 10.5*  --  10.2*  HCT 32.2* 31.0* 31.0*  --  34.6*  PLT 241  --   --   --  221  APTT  --   --   --  25  --   LABPROT  --   --   --  16.7*  --   INR  --   --   --  1.4*  --   CREATININE 0.88  --   --   --   --   TROPONINIHS 11  --   --  193*  --     Estimated Creatinine Clearance: 67.6 mL/min (by C-G formula based on SCr of 0.88 mg/dL).   Medical History: Past Medical History:  Diagnosis Date  . Asthma   . Atrial fibrillation ([place]) 03/31/2019   New onset a fib during 01/2019 admission, d/c on xarelto per cardiology recommendation  . Colon polyps   . COPD (chronic obstructive pulmonary disease) ([place])    3L home O2  . Diverticulosis   . Enlarged prostate    with elevated PSA  . Hyperlipidemia   . Hypertension   . Prediabetes 2011  . Pulmonary hypertension ([place]) 2012   Assessment: 84 year old male with history of afib on xarelto prior to admit, now post arrest. He unfortunately suffered rib fractures from CPR. Given potential for pulmonary embolism, new orders received to start IV heparin    Goal of Therapy:  Heparin level 0.3-0.7 units/ml aPTT 66-102 seconds Monitor platelets by anticoagulation protocol: Yes   Plan:  Start heparin infusion at 800 units/hr Check anti-Xa level in 8 hours and daily while on heparin Continue to monitor H&H and platelets  Erin Hearing PharmD., BCPS Clinical Pharmacist 08/01/2019 2:49 PM

## 2019-08-01 NOTE — Progress Notes (Signed)
Pharmacy Antibiotic Note  Kyle Mosley is a 84 y.o. male admitted on 08/07/2019 with pneumonia.  Pharmacy has been consulted for vancomycin and cefepime dosing.  Plan: Vancomycin 1500mg  IV Q24H. Goal AUC 400-550.  Expected AUC 450. SCr used 0.88.  Cefepime 2g IV Q8H.  Height: 6' (182.9 cm) Weight: 158 lb 15.2 oz (72.1 kg) IBW/kg (Calculated) : 77.6  Temp (24hrs), Avg:97.7 F (36.5 C), Min:97.5 F (36.4 C), Max:97.9 F (36.6 C)  Recent Labs  Lab 08/22/2019 1726 07/28/2019 1735  WBC 4.8  --   CREATININE 0.88  --   LATICACIDVEN  --  1.8    Estimated Creatinine Clearance: 66 mL/min (by C-G formula based on SCr of 0.88 mg/dL).    No Known Allergies   Thank you for allowing pharmacy to be a part of this patient's care.  Wynona Neat, PharmD, BCPS  08/01/2019 1:10 AM

## 2019-08-01 NOTE — Progress Notes (Signed)
eLink Physician-Brief Progress Note Patient Name: Kyle Mosley DOB: [birth year] MRN: [id number]   Date of Service  08/01/2019  HPI/Events of Note  Troponin = 193 in setting of cardiac arrest and vigorous CPR.  eICU Interventions  Will order: 1. ASA 81 mg per tube now and Q day. 2. Metoprolol 2.5 mg IV Q 6 hours. Hold for HR < 60 or SBP < 105. 3. Continue to trend Troponin.      Intervention Category Major Interventions: Sepsis - evaluation and management  Sommer,Steven Eugene 08/01/2019, 5:53 AM

## 2019-08-01 NOTE — Progress Notes (Deleted)
Checked in with doctor and nurse to see if I could assist in calling or being with family.  Doctor told me that he would do that.  Please page as needed.

## 2019-08-01 NOTE — Progress Notes (Signed)
Critical Troponin I ( High Sensitivity) 193 reported to Dr. Oletta Darter.

## 2019-08-01 NOTE — Consult Note (Signed)
Reason for Consult:Right pneumothorax Referring Physician: Dr. Patsey Berthold CCM  Kyle Mosley is an 84 y.o. male.  HPI: 84 yo man presented to ED after out of hospital cardiac arrest.  This evening had a witnessed OOH arrest. Brought to ED by EMS. CPR was done. Intubated. Noted to have massive SQ emphysema. CT placement attempted but was noty in chest. Has increasing SQ emphysema.  Intubated, unresponsive.  Past Medical History:  Diagnosis Date  . Asthma   . Atrial fibrillation ([place]) 03/31/2019   New onset a fib during 01/2019 admission, d/c on xarelto per cardiology recommendation  . Colon polyps   . COPD (chronic obstructive pulmonary disease) ([place])    3L home O2  . Diverticulosis   . Enlarged prostate    with elevated PSA  . Hyperlipidemia   . Hypertension   . Prediabetes 2011  . Pulmonary hypertension ([place]) 2012    Past Surgical History:  Procedure Laterality Date  . COLONOSCOPY  03/21/2007   Dr. Jonny Ruiz diverticula, normal rectum  . COLONOSCOPY  05/04/2012   Procedure: COLONOSCOPY;  Surgeon: Daneil Dolin, MD;  Location: [facility];  Service: Endoscopy;  Laterality: N/A;  7:30  . PROSTATE BIOPSY      Family History  Problem Relation Age of Onset  . Heart attack Father   . Alcohol abuse Mother   . Hypertension Brother   . COPD Sister     Social History:  reports that he quit smoking about 22 years ago. He has a 25.00 pack-year smoking history. He has never used smokeless tobacco. He reports that he does not drink alcohol or use drugs.  Allergies: No Known Allergies  Medications: Prior to Admission: (Not in a hospital admission)   Results for orders placed or performed during the hospital encounter of 08/14/2019 (from the past 48 hour(s))  CBG monitoring, ED     Status: None   Collection Time: 08/03/2019  5:20 PM  Result Value Ref Range   Glucose-Capillary 86 70 - 99 mg/dL   Comment 1 Notify RN    Comment 2 Document in Chart   CBC with Differential      Status: Abnormal   Collection Time: 08/01/2019  5:26 PM  Result Value Ref Range   WBC 4.8 4.0 - 10.5 K/uL   RBC 3.16 (L) 4.22 - 5.81 MIL/uL   Hemoglobin 9.0 (L) 13.0 - 17.0 g/dL   HCT 32.2 (L) 39.0 - 52.0 %   MCV 101.9 (H) 80.0 - 100.0 fL   MCH 28.5 26.0 - 34.0 pg   MCHC 28.0 (L) 30.0 - 36.0 g/dL   RDW 14.3 11.5 - 15.5 %   Platelets 241 150 - 400 K/uL   nRBC 0.0 0.0 - 0.2 %   Neutrophils Relative % 79 %   Neutro Abs 3.8 1.7 - 7.7 K/uL   Lymphocytes Relative 9 %   Lymphs Abs 0.4 (L) 0.7 - 4.0 K/uL   Monocytes Relative 10 %   Monocytes Absolute 0.5 0.1 - 1.0 K/uL   Eosinophils Relative 2 %   Eosinophils Absolute 0.1 0.0 - 0.5 K/uL   Basophils Relative 0 %   Basophils Absolute 0.0 0.0 - 0.1 K/uL   Immature Granulocytes 0 %   Abs Immature Granulocytes 0.02 0.00 - 0.07 K/uL    Comment: Performed at [facility], [address] [place], [place] [id number]  Basic metabolic panel     Status: Abnormal   Collection Time: 08/22/2019  5:26 PM  Result  Value Ref Range   Sodium 143 135 - 145 mmol/L   Potassium 4.4 3.5 - 5.1 mmol/L   Chloride 95 (L) 98 - 111 mmol/L   CO2 39 (H) 22 - 32 mmol/L   Glucose, Bld 97 70 - 99 mg/dL   BUN 21 8 - 23 mg/dL   Creatinine, Ser 0.88 0.61 - 1.24 mg/dL   Calcium 8.3 (L) 8.9 - 10.3 mg/dL   GFR calc non Af Amer >60 >60 mL/min   GFR calc Af Amer >60 >60 mL/min   Anion gap 9 5 - 15    Comment: Performed at [facility] [address], [place], [place] [address]  Troponin I (High Sensitivity)     Status: None   Collection Time: 08/18/2019  5:26 PM  Result Value Ref Range   Troponin I (High Sensitivity) 11 <18 ng/L    Comment: (NOTE) Elevated high sensitivity troponin I (hsTnI) values and significant  changes across serial measurements may suggest ACS but many other  chronic and acute conditions are known to elevate hsTnI results.  Refer to the "Links" section for chest pain algorithms and additional  guidance. Performed at [facility], [place] [address] [place], [place] [address]   TSH     Status: None   Collection Time: 08/07/2019  5:26 PM  Result Value Ref Range   TSH 4.228 0.350 - 4.500 uIU/mL    Comment: Performed by a 3rd Generation assay with a functional sensitivity of <=0.01 uIU/mL. Performed at [facility], [place] [address], [place], [place] [address]   T4, free     Status: None   Collection Time: 08/05/2019  5:26 PM  Result Value Ref Range   Free T4 1.09 0.61 - 1.12 ng/dL    Comment: (NOTE) Biotin ingestion may interfere with free T4 tests. If the results are inconsistent with the TSH level, previous test results, or the clinical presentation, then consider biotin interference. If needed, order repeat testing after stopping biotin. Performed at [facility], [place] [address] [place], [place] [address]   Brain natriuretic peptide     Status: Abnormal   Collection Time: 08/07/2019  5:26 PM  Result Value Ref Range   B Natriuretic Peptide 203.6 (H) 0.0 - 100.0 pg/mL    Comment: Performed at [facility] [address] [place], [place] [address]  Lactic acid, plasma     Status: None   Collection Time: 08/18/2019  5:35 PM  Result Value Ref Range   Lactic Acid, Venous 1.8 0.5 - 1.9 mmol/L    Comment: Performed at [facility] [address] [place], [place] [address]  POCT I-Stat EG7     Status: Abnormal   Collection Time: 07/25/2019  5:55 PM  Result Value Ref Range   pH, Ven 7.327 7.250 - 7.430   pCO2, Ven 83.3 (HH) 44.0 - 60.0 mmHg   pO2, Ven 127.0 (H) 32.0 - 45.0 mmHg   Bicarbonate 43.6 (H) 20.0 - 28.0 mmol/L   TCO2 46 (H) 22 - 32 mmol/L   O2 Saturation 98.0 %   Acid-Base Excess 15.0 (H) 0.0 - 2.0 mmol/L   Sodium 141 135 - 145 mmol/L   Potassium 4.4 3.5 - 5.1 mmol/L   Calcium, Ion 1.12 (L) 1.15 - 1.40 mmol/L   HCT 31.0 (L) 39.0 - 52.0 %   Hemoglobin 10.5 (L) 13.0 - 17.0 g/dL   Patient temperature HIDE    Collection site  BRACHIAL ARTERY    Sample type VENOUS    Comment NOTIFIED  PHYSICIAN   Urinalysis, Routine w reflex microscopic     Status: Abnormal   Collection Time: 08/17/2019  6:06 PM  Result Value Ref Range   Color, Urine YELLOW YELLOW   APPearance HAZY (A) CLEAR   Specific Gravity, Urine 1.012 1.005 - 1.030   pH 5.0 5.0 - 8.0   Glucose, UA NEGATIVE NEGATIVE mg/dL   Hgb urine dipstick NEGATIVE NEGATIVE   Bilirubin Urine NEGATIVE NEGATIVE   Ketones, ur NEGATIVE NEGATIVE mg/dL   Protein, ur 30 (A) NEGATIVE mg/dL   Nitrite NEGATIVE NEGATIVE   Leukocytes,Ua NEGATIVE NEGATIVE   RBC / HPF 0-5 0 - 5 RBC/hpf   WBC, UA 0-5 0 - 5 WBC/hpf   Bacteria, UA RARE (A) NONE SEEN   Squamous Epithelial / LPF 0-5 0 - 5   Mucus PRESENT    Hyaline Casts, UA PRESENT     Comment: Performed at [facility], [address] [place], [place] [address]  SARS Coronavirus 2 [facility] order, Performed in [facility] hospital lab) Nasopharyngeal Nasopharyngeal Swab     Status: None   Collection Time: 07/30/2019  7:16 PM   Specimen: Nasopharyngeal Swab  Result Value Ref Range   SARS Coronavirus 2 NEGATIVE NEGATIVE    Comment: (NOTE) If result is NEGATIVE SARS-CoV-2 target nucleic acids are NOT DETECTED. The SARS-CoV-2 RNA is generally detectable in upper and lower  respiratory specimens during the acute phase of infection. The lowest  concentration of SARS-CoV-2 viral copies this assay can detect is 250  copies / mL. A negative result does not preclude SARS-CoV-2 infection  and should not be used as the sole basis for treatment or other  patient management decisions.  A negative result may occur with  improper specimen collection / handling, submission of specimen other  than nasopharyngeal swab, presence of viral mutation(s) within the  areas targeted by this assay, and inadequate number of viral copies  (<250 copies / mL). A negative result must be combined with clinical  observations, patient history, and epidemiological information. If result is POSITIVE SARS-CoV-2  target nucleic acids are DETECTED. The SARS-CoV-2 RNA is generally detectable in upper and lower  respiratory specimens dur ing the acute phase of infection.  Positive  results are indicative of active infection with SARS-CoV-2.  Clinical  correlation with patient history and other diagnostic information is  necessary to determine patient infection status.  Positive results do  not rule out bacterial infection or co-infection with other viruses. If result is PRESUMPTIVE POSTIVE SARS-CoV-2 nucleic acids MAY BE PRESENT.   A presumptive positive result was obtained on the submitted specimen  and confirmed on repeat testing.  While 2019 novel coronavirus  (SARS-CoV-2) nucleic acids may be present in the submitted sample  additional confirmatory testing may be necessary for epidemiological  and / or clinical management purposes  to differentiate between  SARS-CoV-2 and other Sarbecovirus currently known to infect humans.  If clinically indicated additional testing with an alternate test  methodology [phone]) is advised. The SARS-CoV-2 RNA is generally  detectable in upper and lower respiratory sp ecimens during the acute  phase of infection. The expected result is Negative. Fact Sheet for Patients:  StrictlyIdeas.no Fact Sheet for Healthcare Providers: BankingDealers.co.za This test is not yet approved or cleared by the [place] FDA and has been authorized for detection and/or diagnosis of SARS-CoV-2 by FDA under an Emergency Use Authorization (EUA).  This EUA will remain in effect (meaning this test can be used) for the duration of the COVID-19 declaration under Section 564(b)(1) of the Act, 21 U.S.C. section 360bbb-3(b)(1), unless the authorization is terminated or revoked sooner. Performed at [facility], [place] [address], [place], [place] [address]   Blood gas, arterial     Status: Abnormal   Collection Time: 08/13/2019  9:36  PM  Result Value Ref Range   FIO2 1.00    Delivery systems VENTILATOR    Mode PRESSURE REGULATED VOLUME CONTROL    VT 510 mL   LHR 26 resp/min   Peep/cpap 5.0 cm H20   pH, Arterial 7.254 (L) 7.350 - 7.450   pCO2 arterial 97.1 (HH) 32.0 - 48.0 mmHg    Comment: CRITICAL RESULT CALLED TO, READ BACK BY AND VERIFIED WITH: JOSEPH MEEKS RRT @2143  BY TIFFANY KINGSTON RRT RCP ON 08/19/2019    pO2, Arterial 212 (H) 83.0 - 108.0 mmHg   Bicarbonate 41.9 (H) 20.0 - 28.0 mmol/L   Acid-Base Excess 14.3 (H) 0.0 - 2.0 mmol/L   O2 Saturation 99.1 %   Patient temperature 97.5    Collection site RIGHT RADIAL    Drawn by [id number]    Sample type ARTERIAL DRAW    Allens test (pass/fail) PASS PASS  I-STAT 7, (LYTES, BLD GAS, ICA, H+H)     Status: Abnormal   Collection Time: 08/21/2019 11:44 PM  Result Value Ref Range   pH, Arterial 7.412 7.350 - 7.450   pCO2 arterial 65.1 (HH) 32.0 - 48.0 mmHg   pO2, Arterial 80.0 (L) 83.0 - 108.0 mmHg   Bicarbonate 41.7 (H) 20.0 - 28.0 mmol/L   TCO2 44 (H) 22 - 32 mmol/L   O2 Saturation 96.0 %   Acid-Base Excess 14.0 (H) 0.0 - 2.0 mmol/L   Sodium 146 (H) 135 - 145 mmol/L   Potassium 4.6 3.5 - 5.1 mmol/L   Calcium, Ion 1.06 (L) 1.15 - 1.40 mmol/L   HCT 31.0 (L) 39.0 - 52.0 %   Hemoglobin 10.5 (L) 13.0 - 17.0 g/dL   Patient temperature 97.5 F    Collection site RADIAL, ALLEN'S TEST ACCEPTABLE    Drawn by RT    Sample type ARTERIAL    Comment NOTIFIED PHYSICIAN     Ct Head Wo Contrast  Result Date: 08/20/2019 CLINICAL DATA:  84 year old male with altered mental status. EXAM: CT HEAD WITHOUT CONTRAST TECHNIQUE: Contiguous axial images were obtained from the base of the skull through the vertex without intravenous contrast. COMPARISON:  None. FINDINGS: Brain: There is mild age-related atrophy and chronic microvascular ischemic changes. There is no acute intracranial hemorrhage. No mass effect or midline shift. No extra-axial fluid collection. Vascular: No hyperdense  vessel or unexpected calcification. Skull: Normal. Negative for fracture or focal lesion. Sinuses/Orbits: A 2 cm left maxillary sinus retention cyst or polyp. Mild mucoperiosteal thickening of paranasal sinuses. No air-fluid level. The mastoid air cells are clear. Other: None IMPRESSION: 1. No acute intracranial pathology. 2. Mild age-related atrophy and chronic microvascular ischemic changes. Electronically Signed   By: Anner Crete M.D.   On: 07/29/2019 18:54   Ct Chest Wo Contrast  Result Date: 08/01/2019 CLINICAL DATA:  Shortness of breath, pneumothorax EXAM: CT CHEST WITHOUT CONTRAST TECHNIQUE: Multidetector CT imaging of the chest was performed following the standard protocol without IV contrast. COMPARISON:  Most recent prior radiograph same day FINDINGS: Cardiovascular: There is cardiomegaly with a small pericardial effusion. Scattered aortic vascular calcifications. Mediastinum/Nodes: There is  pneumomediastinum tracking into the upper anterior mediastinal space. Small amount of fluid seen within the trachea. ET tube is seen 5 cm above the level of the carina. Lungs/Pleura: There is a moderate right hydropneumothorax with slight rightward mediastinal shift. Along the anterior right chest wall there appears to be a 4.4 cm gap in the chest wall at the costochondral junction with adjacent fractures of the anterior right second through fourth costochondral junctions. At this level of the defect there is a thin pleural covering, with a probable tear within the pleura at this level. There is reticulonodular thickening and consolidation in the posterior right lower lung. There is extensive centrilobular emphysematous changes with large right-sided apical bullae. Also noted is extensive centrilobular emphysematous changes throughout the left lung. There is small amount patchy airspace opacity seen at the left lung. Upper abdomen: The NG tube is seen within the distal esophagus approximately 5 cm above the  level of the diaphragm. Musculoskeletal/Chest wall: Extensive subcutaneous emphysema is seen throughout the chest extending into the neck and upper mediastinum. There are fractures along the posterior first through fourth ribs. There is also fractures along the anterior second through fourth ribs involving the costochondral junctions. IMPRESSION: 1. Moderate right hydropneumothorax with slight rightward mediastinal shift. 2. Extensive subcutaneous emphysema tracking into the neck and a small amount pneumomediastinum 3. Along the anterior chest there is a 4.4 cm defect in the chest wall at the costochondral junction with a probable tear in the adjacent pleura. 4. Fractures and deformity of the anterior chest wall involving the second through fourth ribs at the costochondral junctions. 5. Posterior first through fourth rib fractures. 6. Bilateral extensive centrilobular emphysematous changes. 7. Patchy airspace opacities seen at the posterior right lower lung could be aspiration or pneumonia. 8. NG tube approximately 5 cm above the level the diaphragm in the distal esophagus. 9. These results were called by telephone at the time of interpretation on 08/01/2019 at 12:52 am to provider DR Sharmon Leyden, who verbally acknowledged these results. Electronically Signed   By: Prudencio Pair M.D.   On: 08/01/2019 00:57   Dg Chest Portable 1 View  Result Date: 08/15/2019 CLINICAL DATA:  Chest tube placement EXAM: PORTABLE CHEST 1 VIEW COMPARISON:  08/20/2019 FINDINGS: Right chest tube has been placed, but is outside of the pleural space, within the right chest wall. Extensive subcutaneous emphysema. Pneumothorax appears to be present laterally over the right upper lung. Diffuse right lung airspace disease. Underlying COPD. Heart is normal size. IMPRESSION: Right chest tube is in the chest wall soft tissues, not within the pleural space. Extensive subcutaneous emphysema. Probable lateral right upper pneumothorax. Electronically  Signed   By: Rolm Baptise M.D.   On: 08/18/2019 23:34   Dg Chest Port 1 View  Result Date: 08/14/2019 CLINICAL DATA:  [place].  Chest trauma. EXAM: PORTABLE CHEST 1 VIEW COMPARISON:  Earlier film from 1736 hours. FINDINGS: There is an NG tube coursing down the esophagus and into the stomach. The endotracheal tube is approximately 7 cm above the carina. External pacer paddles are in place. Interval development of extensive subcutaneous emphysema. I do not see an obvious pneumothorax or pneumomediastinum. The heart is normal in size. Mild tortuosity and calcification of the thoracic aorta. New airspace opacification in the right perihilar region could be a contusion or aspiration. IMPRESSION: 1. The endotracheal tube and NG tubes appear to be in good position without complicating features. 2. New extensive subcutaneous emphysema without obvious pneumothorax or pneumomediastinum. 3. New right  lung airspace opacity could be pulmonary contusion or aspiration. Electronically Signed   By: Marijo Sanes M.D.   On: 08/08/2019 21:43   Dg Chest Port 1 View  Result Date: 08/14/2019 CLINICAL DATA:  Weakness EXAM: PORTABLE CHEST 1 VIEW COMPARISON:  06/30/2019, 02/18/2019 FINDINGS: Hyperinflation with emphysematous disease. Hyper lucency at the right apex, felt secondary to apical bulla. Small right-sided pleural effusion with airspace disease at the right base. Normal heart size. No pneumothorax. IMPRESSION: 1. Hyperinflation with emphysematous disease 2. Small right-sided pleural effusion with airspace disease at the right base, atelectasis versus pneumonia Electronically Signed   By: Donavan Foil M.D.   On: 08/21/2019 17:54  I personally reviewed the CT and CXR images and concur with the above  Review of Systems  Unable to perform ROS: Intubated   Blood pressure 137/84, pulse 94, temperature (!) 97.5 F (36.4 C), temperature source Rectal, resp. rate (!) 22, height 6' (1.829 m), weight 72.1 kg, SpO2 100 %. Physical  Exam  Vitals reviewed. HENT:  SQ air face/ eyes  Neck: No tracheal deviation present.  Cardiovascular: Regular rhythm.  No murmur heard. tachycardic  Respiratory:  Diffuse SQ emphysema, rhonchi bilaterally  Neurological:  Intubated, sedated  Skin:  extensive ecchymoses    Assessment/Plan: 84 yo man with OOH cardiac arrest. Post CPR has rib fractures and a right pneumothorax with massive SQ emphysema. Needs CT placement to drain pleural space.    Melrose Nakayama 08/01/2019, 1:03 AM

## 2019-08-01 NOTE — Progress Notes (Signed)
Received page to correspond with Kyle Mosley family.  Was given sister's number to call.  Upon calling twice the line was busy.  Will follow-up later this morning.

## 2019-08-01 NOTE — Procedures (Signed)
Patient intubated, sedated post cardiac arrest  Previous CT attempt unsuccessful  Sterile technique, local with 10 ml 1% lidocaine. 44 F chest tube placed anteriorly +rush of air with chest entry Tube passed easily Large air leak CT placed to suction  Remo Lipps C. Roxan Hockey, MD [facility] [phone]

## 2019-08-01 NOTE — Progress Notes (Signed)
eLink Physician-Brief Progress Note Patient Name: Kyle Mosley DOB: 05-12-1935 MRN: [id number]   Date of Service  08/01/2019  HPI/Events of Note  Nursing request for CVL d/t [place] emphysema and lab unable to get ordered studies also request for A-line.   eICU Interventions  Will order: 1. Respiratory Therapy to place A-line. 2. Ground team notified of request to place CVL.     Intervention Category Major Interventions: Other:  Daiquan Resnik Cornelia Copa 08/01/2019, 3:19 AM

## 2019-08-01 NOTE — Procedures (Addendum)
Patient Name: Kyle Mosley  MRN: [id number]  Epilepsy Attending: Lora Havens  Referring Physician/Provider: Jennelle Human, NP Date: 08/01/2019 Duration: 25.42 mins  Patient history: 84 year old male with cardiac arrest.  EEG to evaluate for seizure.  Level of alertness: Comatose  AEDs during EEG study: Versed  Technical aspects: This EEG study was done with scalp electrodes positioned according to the 10-20 International system of electrode placement. Electrical activity was acquired at a sampling rate of 500Hz  and reviewed with a high frequency filter of 70Hz  and a low frequency filter of 1Hz . EEG data were recorded continuously and digitally stored.   Description: EEG showed continuous generalized 2 to 3 Hz delta slowing.  EEG was technically difficult due to significant myogenic artifact. EEG was not reactive to tactile stimulation. Hyperventilation and photic stimulation were not performed.  EEG tech annotated that patient was chewing on ET tube. Concomitant EEG before, during and after the event did not show any EEG change to suggest seizures.  IMPRESSION: This technically difficult study is suggestive of profound diffuse encephalopathy.  Episodes of chewing on ET Tube were captured during the study and did not show EEG change to suggest seizures.  No seizures or epileptiform discharges were seen throughout the recording.  Garnet Overfield Barbra Sarks

## 2019-08-01 NOTE — Progress Notes (Signed)
EEG complete - results pending 

## 2019-08-01 NOTE — Procedures (Signed)
Arterial Catheter Insertion Procedure Note IORI CARACAPPA [id number] [birth year]  Procedure: Insertion of Arterial Catheter  Indications: Blood pressure monitoring  Procedure Details Consent: Unable to obtain consent because of altered level of consciousness. Time Out: Verified patient identification, verified procedure, site/side was marked, verified correct patient position, special equipment/implants available, medications/allergies/relevent history reviewed, required imaging and test results available.  Performed  Maximum sterile technique was used including antiseptics, cap, gloves, gown, hand hygiene, mask and sheet. Skin prep: Chlorhexidine; local anesthetic administered 20 gauge catheter was inserted into right radial artery using the Seldinger technique. ULTRASOUND GUIDANCE USED: NO Evaluation Blood flow good; BP tracing good. Complications: No apparent complications.   Roby Lofts Jurnee Nakayama 08/01/2019

## 2019-08-02 ENCOUNTER — Other Ambulatory Visit: Payer: Self-pay

## 2019-08-02 ENCOUNTER — Inpatient Hospital Stay (HOSPITAL_COMMUNITY): Payer: Medicare HMO

## 2019-08-02 DIAGNOSIS — J9311 Primary spontaneous pneumothorax: Secondary | ICD-10-CM

## 2019-08-02 DIAGNOSIS — S299XXA Unspecified injury of thorax, initial encounter: Secondary | ICD-10-CM

## 2019-08-02 DIAGNOSIS — J939 Pneumothorax, unspecified: Secondary | ICD-10-CM

## 2019-08-02 LAB — TROPONIN I (HIGH SENSITIVITY): Troponin I (High Sensitivity): 108 ng/L (ref <18)

## 2019-08-02 LAB — BASIC METABOLIC PANEL
Anion gap: 9 (ref 5–15)
Anion gap: 11 (ref 5–15)
Anion gap: 5 (ref 5–15)
BUN: 37 mg/dL — ABNORMAL HIGH (ref 8–23)
BUN: 42 mg/dL — ABNORMAL HIGH (ref 8–23)
BUN: 43 mg/dL — ABNORMAL HIGH (ref 8–23)
CO2: 34 mmol/L — ABNORMAL HIGH (ref 22–32)
CO2: 34 mmol/L — ABNORMAL HIGH (ref 22–32)
CO2: 36 mmol/L — ABNORMAL HIGH (ref 22–32)
Calcium: 7.4 mg/dL — ABNORMAL LOW (ref 8.9–10.3)
Calcium: 7.5 mg/dL — ABNORMAL LOW (ref 8.9–10.3)
Calcium: 7.5 mg/dL — ABNORMAL LOW (ref 8.9–10.3)
Chloride: 102 mmol/L (ref 98–111)
Chloride: 103 mmol/L (ref 98–111)
Chloride: 104 mmol/L (ref 98–111)
Creatinine, Ser: 0.87 mg/dL (ref 0.61–1.24)
Creatinine, Ser: 0.97 mg/dL (ref 0.61–1.24)
Creatinine, Ser: 0.79 mg/dL (ref 0.61–1.24)
GFR calc Af Amer: >60 mL/min (ref >60)
GFR calc Af Amer: >60 mL/min (ref >60)
GFR calc Af Amer: >60 mL/min (ref >60)
GFR calc non Af Amer: >60 mL/min (ref >60)
GFR calc non Af Amer: >60 mL/min (ref >60)
GFR calc non Af Amer: >60 mL/min (ref >60)
Glucose, Bld: 97 mg/dL (ref 70–99)
Glucose, Bld: 99 mg/dL (ref 70–99)
Glucose, Bld: 121 mg/dL — ABNORMAL HIGH (ref 70–99)
Potassium: 4.1 mmol/L (ref 3.5–5.1)
Potassium: 4.1 mmol/L (ref 3.5–5.1)
Potassium: 3.8 mmol/L (ref 3.5–5.1)
Sodium: 145 mmol/L (ref 135–145)
Sodium: 148 mmol/L — ABNORMAL HIGH (ref 135–145)
Sodium: 145 mmol/L (ref 135–145)

## 2019-08-02 LAB — CBC
HCT: 30.5 % — ABNORMAL LOW (ref 39.0–52.0)
Hemoglobin: 9.1 g/dL — ABNORMAL LOW (ref 13.0–17.0)
MCH: 29.1 pg (ref 26.0–34.0)
MCHC: 29.8 g/dL — ABNORMAL LOW (ref 30.0–36.0)
MCV: 97.4 fL (ref 80.0–100.0)
Platelets: 201 10*3/uL (ref 150–400)
RBC: 3.13 MIL/uL — ABNORMAL LOW (ref 4.22–5.81)
RDW: 15.3 % (ref 11.5–15.5)
WBC: 12.9 10*3/uL — ABNORMAL HIGH (ref 4.0–10.5)
nRBC: 0 % (ref 0.0–0.2)

## 2019-08-02 LAB — GLUCOSE, CAPILLARY
Glucose-Capillary: 100 mg/dL — ABNORMAL HIGH (ref 70–99)
Glucose-Capillary: 59 mg/dL — ABNORMAL LOW (ref 70–99)
Glucose-Capillary: 88 mg/dL (ref 70–99)
Glucose-Capillary: 84 mg/dL (ref 70–99)
Glucose-Capillary: 99 mg/dL (ref 70–99)
Glucose-Capillary: 106 mg/dL — ABNORMAL HIGH (ref 70–99)
Glucose-Capillary: 99 mg/dL (ref 70–99)

## 2019-08-02 LAB — COMPREHENSIVE METABOLIC PANEL
ALT: 69 U/L — ABNORMAL HIGH (ref 0–44)
AST: 95 U/L — ABNORMAL HIGH (ref 15–41)
Albumin: 1.9 g/dL — ABNORMAL LOW (ref 3.5–5.0)
Alkaline Phosphatase: 84 U/L (ref 38–126)
Anion gap: 9 (ref 5–15)
BUN: 40 mg/dL — ABNORMAL HIGH (ref 8–23)
CO2: 35 mmol/L — ABNORMAL HIGH (ref 22–32)
Calcium: 7.5 mg/dL — ABNORMAL LOW (ref 8.9–10.3)
Chloride: 102 mmol/L (ref 98–111)
Creatinine, Ser: 0.94 mg/dL (ref 0.61–1.24)
GFR calc Af Amer: >60 mL/min (ref >60)
GFR calc non Af Amer: >60 mL/min (ref >60)
Glucose, Bld: 96 mg/dL (ref 70–99)
Potassium: 4.2 mmol/L (ref 3.5–5.1)
Sodium: 146 mmol/L — ABNORMAL HIGH (ref 135–145)
Total Bilirubin: 1 mg/dL (ref 0.3–1.2)
Total Protein: 4.8 g/dL — ABNORMAL LOW (ref 6.5–8.1)

## 2019-08-02 LAB — PROCALCITONIN: Procalcitonin: 0.65 ng/mL

## 2019-08-02 LAB — LEGIONELLA PNEUMOPHILA SEROGP 1 UR AG: L. pneumophila Serogp 1 Ur Ag: NEGATIVE

## 2019-08-02 LAB — HEPARIN LEVEL (UNFRACTIONATED)
Heparin Unfractionated: 0.24 IU/mL — ABNORMAL LOW (ref 0.30–0.70)
Heparin Unfractionated: 0.23 IU/mL — ABNORMAL LOW (ref 0.30–0.70)
Heparin Unfractionated: 0.29 IU/mL — ABNORMAL LOW (ref 0.30–0.70)

## 2019-08-02 LAB — MAGNESIUM: Magnesium: 2.3 mg/dL (ref 1.7–2.4)

## 2019-08-02 LAB — MRSA PCR SCREENING: MRSA by PCR: NEGATIVE

## 2019-08-02 MED ORDER — FREE WATER
100.0000 mL | Freq: Three times a day (TID) | Status: DC
  Administered 2019-08-02 – 2019-08-03 (×3): 100 mL

## 2019-08-02 MED ORDER — PRO-STAT SUGAR FREE PO LIQD
30.0000 mL | Freq: Two times a day (BID) | ORAL | Status: DC
  Administered 2019-08-02 (×2): 30 mL
  Filled 2019-08-02 (×2): qty 30

## 2019-08-02 MED ORDER — VITAL AF 1.2 CAL PO LIQD
1000.0000 mL | ORAL | Status: DC
  Administered 2019-08-02: 1000 mL

## 2019-08-02 NOTE — Progress Notes (Signed)
[place] for Heparin Indication: afib, r/o PE No Known Allergies  Patient Measurements: Height: 6' (182.9 cm) Weight: 162 lb (73.5 kg) IBW/kg (Calculated) : 77.6 Heparin Dosing Weight: 74kg  Vital Signs: Temp: 98.6 F (37 C) (09/10 2000) Temp Source: Bladder (09/10 2000) BP: 120/61 (09/10 2025) Pulse Rate: 83 (09/10 2025)  Labs: Recent Labs    08/08/2019 1726  08/15/2019 2344 08/01/19 0429 08/01/19 0452 08/01/19 2349 08/02/19 0432 08/02/19 0804 08/02/19 1046 08/02/19 2052  HGB 9.0*   < > 10.5*  --  10.2*  --  9.1*  --   --   --   HCT 32.2*   < > 31.0*  --  34.6*  --  30.5*  --   --   --   PLT 241  --   --   --  221  --  201  --   --   --   APTT  --   --   --  25  --   --   --   --   --   --   LABPROT  --   --   --  16.7*  --   --   --   --   --   --   INR  --   --   --  1.4*  --   --   --   --   --   --   HEPARINUNFRC  --   --   --   --   --  0.24*  --   --  0.23* 0.29*  CREATININE 0.88  --   --   --   --  0.87 0.94 0.97  --  0.79  TROPONINIHS 11  --   --  193*  --   --   --  108*  --   --    < > = values in this interval not displayed.    Estimated Creatinine Clearance: 74 mL/min (by C-G formula based on SCr of 0.79 mg/dL).   Medical History: Past Medical History:  Diagnosis Date  . Asthma   . Atrial fibrillation ([place]) 03/31/2019   New onset a fib during 01/2019 admission, d/c on xarelto per cardiology recommendation  . Colon polyps   . COPD (chronic obstructive pulmonary disease) ([place])    3L home O2  . Diverticulosis   . Enlarged prostate    with elevated PSA  . Hyperlipidemia   . Hypertension   . Prediabetes 2011  . Pulmonary hypertension ([place]) 2012   Assessment: 84 year old male with history of afib on xarelto prior to admit, now post arrest. He unfortunately suffered rib fractures from CPR. Patient has not been taking Xarelto for the last week or so due to running out of medication. Given potential for pulmonary  embolism and patient's history of A-fib, pharmacy consulted to dose IV heparin.   Heparin level this evening is slightly SUBtherapeutic despite a rate increase earlier today (HL 0.29 << 0.23, goal of 0.3-0.7). No bleeding or issues noted per discussion with RN.   Goal of Therapy:  Heparin level 0.3-0.7 units/mL Monitor platelets by anticoagulation protocol: Yes   Plan:  - Increase Heparin drip rate to 1250 units/hr (12.5 ml/hr) - Will continue to monitor for any signs/symptoms of bleeding and will follow up with heparin level in 8 hours   Thank you for allowing pharmacy to be a part of this patient's care.  Alycia Rossetti, PharmD,  BCPS Clinical Pharmacist Clinical phone for 08/02/2019: [phone] 08/02/2019 9:46 PM   **Pharmacist phone directory can now be found on [email or web address] ([email or web address]).  Listed under [place].

## 2019-08-02 NOTE — Care Management (Signed)
Benefits check sent and pending.  Midge Minium RN, BSN, NCM-BC, ACM-RN [phone]

## 2019-08-02 NOTE — TOC Benefit Eligibility Note (Signed)
Transition of Care [facility]) Benefit Eligibility Note    Patient Details  Name: Kyle Mosley MRN: [id number] Date of Birth: 1934/12/04   Medication/Dose: Eliquis/Apixaban 5 mg twice daily  &  2.5 mg twice daily.  Covered?: Yes  Tier: 3 Drug  Prescription Coverage Preferred Pharmacy: [facility]  Spoke with Person/Company/Phone Number:: Adriena  Co-Pay: 60 for a 30 day supply 45.00 for a 90 day supply mail order 336.07  Prior Approval: No  Deductible: Unmet(160.00)  Additional Notes: Apixaban [place] Phone Number: 08/02/2019, 4:45 PM

## 2019-08-02 NOTE — Progress Notes (Signed)
[place] for Heparin Indication: afib, r/o PE No Known Allergies  Patient Measurements: Height: 6' (182.9 cm) Weight: 162 lb (73.5 kg) IBW/kg (Calculated) : 77.6 Heparin Dosing Weight: 74kg  Vital Signs: Temp: 96.8 F (36 C) (09/10 0000) Temp Source: Bladder (09/10 0000) BP: 97/51 (09/10 0100) Pulse Rate: 93 (09/10 0100)  Labs: Recent Labs    08/15/2019 1726 08/20/2019 1755 07/25/2019 2344 08/01/19 0429 08/01/19 0452 08/01/19 2349  HGB 9.0* 10.5* 10.5*  --  10.2*  --   HCT 32.2* 31.0* 31.0*  --  34.6*  --   PLT 241  --   --   --  221  --   APTT  --   --   --  25  --   --   LABPROT  --   --   --  16.7*  --   --   INR  --   --   --  1.4*  --   --   HEPARINUNFRC  --   --   --   --   --  0.24*  CREATININE 0.88  --   --   --   --  0.87  TROPONINIHS 11  --   --  193*  --   --     Estimated Creatinine Clearance: 68.1 mL/min (by C-G formula based on SCr of 0.87 mg/dL).   Medical History: Past Medical History:  Diagnosis Date  . Asthma   . Atrial fibrillation ([place]) 03/31/2019   New onset a fib during 01/2019 admission, d/c on xarelto per cardiology recommendation  . Colon polyps   . COPD (chronic obstructive pulmonary disease) ([place])    3L home O2  . Diverticulosis   . Enlarged prostate    with elevated PSA  . Hyperlipidemia   . Hypertension   . Prediabetes 2011  . Pulmonary hypertension ([place]) 2012   Assessment: 84 year old male with history of afib on xarelto prior to admit, now post arrest. He unfortunately suffered rib fractures from CPR. Given potential for pulmonary embolism, new orders received to start IV heparin   9/10 AM update: heparin level is low, no issues per RN  Goal of Therapy:  Heparin level 0.3-0.7 units/mL Monitor platelets by anticoagulation protocol: Yes   Plan:  Inc heparin to 950 units/hr Re-check heparin level in 8 hours  Narda Bonds, PharmD, [place] Pharmacist Phone: [phone]

## 2019-08-02 NOTE — Progress Notes (Addendum)
NAME:  Kyle Mosley, MRN:  [id number], DOB:  21-Dec-1934, LOS: 2 ADMISSION DATE:  07/27/2019, CHIEF COMPLAINT:  Cardiac arrest   Brief History   84 year old male presenting with syncopal episode, weakness, and lower leg swelling who decompensated in ER, found unresponsive in asystolic cardiac arrest, ROSC after 20 mins.  Developed severe diffuse subcutaneous air during CPR without obvious pneumothorax on CXR.  On vasopressor support for hypotension.  PCCM called for admit.   History of present illness   HPI obtained from medical chart review as patient is unresponsive on mechanical ventilation.    84 year old male with prior history of COPD on home O2/ steroid dependent, asthma, Afib on Xarelto, HFpEF, HTN, HLD, and pulmonary hypertension who presented from home after progressive lower extremity swelling for 3 weeks, using his inhaler more frequently but not complaining of SOB, who became weak today and collapsed.  Possible recent falls at home.    On arrival to ER, patient was alert and oriented.  Denied any chest pain or shortness of breath.  Some hypoxia treated for CAP/ COPD exacerbation in ER with magnesium, solumedrol, nebs, vancomycin, zosyn, and 1 L bolus with CXR showing possible right sided pneumonia.  He was about to admitted and then found unresponsive and in asystole.  ACLS measures performed.  During arrest, patient developed Vtach and defibrillated 3 times with ROSC after 20 mins.  During CPR, staff stated right side of chest developed subcutaneous air then becoming diffuse.  CXR without obvious pneumothorax.  Required levophed for ongoing hypotension.  Neurologically, remained unresponsive, some movement in upper arms to noxious stimuli.  PCCM called for admit. Pt was cooled and began to spontaneously awake on 9/10.    Past Medical History  COPD on home O2/ steroid dependent, HFpEF, Afib on Xarelto, HTN, HLD, pulmonary hypertension, enlarged prostate, anemia of chronic disease    Significant Hospital Events   9/8 Admit / cardiac arrest 9/9 Off pressors 9/10 Following commands  Consults:  CT surgery  Procedures:  9/8 ETT >> 9/8 foley >> 9/8 OGT >> 9/9 L femoral central line>>  Significant Diagnostic Tests:  9/8 [facility] >> No acute intracranial pathology,  Mild age-related atrophy and chronic microvascular ischemic changes. 9/9 CT chest  >>moderate R hydropneumonothorax, extensive subcutaneous emphysema, possible tear in anterior costochondral junction, patchy RLL opacity  9/9 EEG>>continuous generalized slowing, no epileptiform changes 9/9 bilat LE venous doppler>>no evidence DVT  Micro Data:  9/8 COVID >> neg 9/8 BCx 2 >> 9/8 trach asp >>  Antimicrobials:  9/8 vanc>>9/10 9/8 Cefepime>>  Interim history/subjective:  Improving neurologic status this morning, opening eyes and following commands  Objective   Blood pressure 109/61, pulse 92, temperature 98.1 F (36.7 C), temperature source Bladder, resp. rate (!) 22, height 6' (1.829 m), weight 73.5 kg, SpO2 98 %.    Vent Mode: PRVC FiO2 (%):  [50 %-60 %] 50 % Set Rate:  [22 bmp] 22 bmp Vt Set:  [460 mL] 460 mL PEEP:  [5 cmH20] 5 cmH20 Plateau Pressure:  [7 cmH20-11 cmH20] 11 cmH20   Intake/Output Summary (Last 24 hours) at 08/02/2019 0732 Last data filed at 08/02/2019 0700 Gross per 24 hour  Intake 2532.52 ml  Output 915 ml  Net 1617.52 ml   Filed Weights   08/13/2019 1721 08/01/19 0523 08/01/19 1400  Weight: 72.1 kg 73.9 kg 73.5 kg    General:  Elderly M, intubated and mildly sedated HEENT: MM pink/moist Neuro: opens eyes to voice and  moving extremities on command CV: s1s2 RRR, no m/r/g PULM:  R chest tube in place, course breath sounds bilaterally, no wheezing, intubated GI: soft, bsx4 active  Extremities: warm/dry, 1+ edema Skin: diffuse subcutaneous air and upper extremity skin weeping    Resolved Hospital Problem list   Cardiogenic shock  Assessment & Plan:   Asystolic Cardiac  Arrest with subsequent Vtach probable cardiogenic shock  -Pt came into the ED awake and mildly hypoxic and was being treated for COPD exacerbation, at some point coded, Per hospitalist note walked into find patient unresponsive and pulseless, unknown down-time -ROSC after 20 minutes of CPR and defibrillation -unclear etiology for arrest, possibly ruptured bleb leading to PTX vs PE -EKG without STEMI -CT head negative, CT chest (no contrast)  P: -s/p TTM 36 degrees, re-warming today -Echocardiogram technically difficult 2/2 subcutaneous air, tech unable to obtain -awake today -trend troponin -Pt presented with syncope, hypoxia and then cardiac arrest-CT chest done without contrast so consider PE, continuing empiric heparin, LE dopplers negative for DVT   Acute hypoxic respiratory failure with R-sided pneumothorax and infiltrate -R chest tube place by CT surgery -Underlying COPD on baseline 3L  -R-sided infiltrate on CT -no wheezing on exam P: -repeat CXR pending, if infiltrate improving consider narrowing abx as patient is afebrile and cultures so far with no growth -SAT/SBT as able, weaning fentanyl gtt -Chest tube management per CT surgery    Acute encephalopathy following cardiac arrest -Spoke with son who reports at baseline, pt has mild memory issues, but lives independently with wife and completes ADL's, baseline oriented -CT head without acute findings -pt responsive this morning, EEG with diffuse slowing P -minimize sedation, weaning fentanyl -Prognosis is guarded, continue to monitor mental status and neurologic recovery   History of Atrial fibrillation P:  Currently sinus on monitor Hold xarelto while on heparin Goal Mag > 2, K > 4 Hold home cardizem  Hx HTN, HFpEF P:  -unable to obtain echo 2/2 subcutaneous air -continue home metoprolol   Extensive subcutaneous air and UE skin weeping -bandages to arms, poor peripheral IV access P: -wound care -Central  line in place  Best practice:  Diet: NPO Pain/Anxiety/Delirium protocol (if indicated): prn fentanyl /versed VAP protocol (if indicated): yes DVT prophylaxis: heparin SQ GI prophylaxis: PPI Glucose control: SSI Mobility: BR Code Status: Full, prognosis is guarded  Family Communication: Spoke with son to update him Disposition:  ICU   Labs   CBC: Recent Labs  Lab 08/01/2019 1726 07/26/2019 1755 07/29/2019 2344 08/01/19 0452 08/02/19 0432  WBC 4.8  --   --  19.8* 12.9*  NEUTROABS 3.8  --   --   --   --   HGB 9.0* 10.5* 10.5* 10.2* 9.1*  HCT 32.2* 31.0* 31.0* 34.6* 30.5*  MCV 101.9*  --   --  98.9 97.4  PLT 241  --   --  221 [id number]    Basic Metabolic Panel: Recent Labs  Lab 08/11/2019 1726 08/09/2019 1755 08/03/2019 2344 08/01/19 0452 08/01/19 2349 08/02/19 0432  NA 143 141 146*  --  145 146*  K 4.4 4.4 4.6  --  4.1 4.2  CL 95*  --   --   --  102 102  CO2 39*  --   --   --  34* 35*  GLUCOSE 97  --   --   --  97 96  BUN 21  --   --   --  37* 40*  CREATININE 0.88  --   --   --  0.87 0.94  CALCIUM 8.3*  --   --   --  7.4* 7.5*  MG  --   --   --  2.3  --  2.3  PHOS  --   --   --  4.1  --   --    GFR: Estimated Creatinine Clearance: 63 mL/min (by C-G formula based on SCr of 0.94 mg/dL). Recent Labs  Lab 07/27/2019 1726 07/24/2019 1735 08/01/19 0450 08/01/19 0452 08/02/19 0432  PROCALCITON  --   --   --  0.52  --   WBC 4.8  --   --  19.8* 12.9*  LATICACIDVEN  --  1.8 1.6  --   --     Liver Function Tests: Recent Labs  Lab 08/02/19 0432  AST 95*  ALT 69*  ALKPHOS 84  BILITOT 1.0  PROT 4.8*  ALBUMIN 1.9*   No results for input(s): LIPASE, AMYLASE in the last 168 hours. No results for input(s): AMMONIA in the last 168 hours.  ABG    Component Value Date/Time   PHART 7.412 08/15/2019 2344   PCO2ART 65.1 (HH) 08/16/2019 2344   PO2ART 80.0 (L) 08/02/2019 2344   HCO3 41.7 (H) 08/14/2019 2344   TCO2 44 (H) 08/02/2019 2344   O2SAT 96.0 08/12/2019 2344      Coagulation Profile: Recent Labs  Lab 08/01/19 0429  INR 1.4*    Cardiac Enzymes: No results for input(s): CKTOTAL, CKMB, CKMBINDEX, TROPONINI in the last 168 hours.  HbA1C: Hgb A1c MFr Bld  Date/Time Value Ref Range Status  06/30/2019 01:31 PM 5.5 4.8 - 5.6 % Final    Comment:    (NOTE) Pre diabetes:          5.7%-6.4% Diabetes:              >6.4% Glycemic control for   <7.0% adults with diabetes   02/18/2017 05:01 AM 5.8 (H) 4.8 - 5.6 % Final    Comment:    (NOTE)         Pre-diabetes: 5.7 - 6.4         Diabetes: >6.4         Glycemic control for adults with diabetes: <7.0     CBG: Recent Labs  Lab 08/01/19 1532 08/01/19 2032 08/02/19 0101 08/02/19 0416 08/02/19 0438  GLUCAP 85 99 100* 59* 88     CRITICAL CARE Performed by: Otilio Carpen Ricki Clack   Total critical care time: 45 minutes  Critical care time was exclusive of separately billable procedures and treating other patients.  Critical care was necessary to treat or prevent imminent or life-threatening deterioration secondary to cardiac arrest, respiratory failure, pneumothorax and PNA.  Critical care was time spent personally by me on the following activities: development of treatment plan with patient and/or surrogate as well as nursing, discussions with consultants, evaluation of patient's response to treatment, examination of patient, obtaining history from patient or surrogate, ordering and performing treatments and interventions, ordering and review of laboratory studies, ordering and review of radiographic studies, pulse oximetry and re-evaluation of patient's condition.   Otilio Carpen Krisi Azua, PA-C [facility]  Pager# [phone], if no answer [phone]

## 2019-08-02 NOTE — Progress Notes (Signed)
EVENING ROUNDS NOTE :     [address]       [place] [address]             [phone]                      Total Length of Stay:  LOS: 2 days  Events:  Rewarmed.  Continued air leak.    BP 97/61   Pulse 90   Temp 98.6 F (37 C)   Resp 10   Ht 6' (1.829 m)   Wt 73.5 kg   SpO2 100%   BMI 21.97 kg/m      Vent Mode: PRVC FiO2 (%):  [50 %] 50 % Set Rate:  [12 bmp-22 bmp] 12 bmp Vt Set:  [460 mL] 460 mL PEEP:  [5 cmH20] 5 cmH20 Plateau Pressure:  [7 cmH20-11 cmH20] 10 cmH20  . sodium chloride 10 mL/hr at 08/02/19 1700  . ceFEPime (MAXIPIME) IV 2 g (08/02/19 1818)  . feeding supplement (VITAL AF 1.2 CAL) 1,000 mL (08/02/19 1600)  . fentaNYL infusion INTRAVENOUS 35 mcg/hr (08/02/19 1700)  . heparin 1,100 Units/hr (08/02/19 1822)    I/O last 3 completed shifts: In: 2688.2 [I.V.:1788.4; IV Piggyback:899.8] Out: 1390 [Urine:910; Emesis/NG output:100; Chest Tube:380]   CBC Latest Ref Rng & Units 08/02/2019 08/01/2019 08/18/2019  WBC 4.0 - 10.5 K/uL 12.9(H) 19.8(H) -  Hemoglobin 13.0 - 17.0 g/dL 9.1(L) 10.2(L) 10.5(L)  Hematocrit 39.0 - 52.0 % 30.5(L) 34.6(L) 31.0(L)  Platelets 150 - 400 K/uL 201 221 -    BMP Latest Ref Rng & Units 08/02/2019 08/02/2019 08/01/2019  Glucose 70 - 99 mg/dL 99 96 97  BUN 8 - 23 mg/dL 42(H) 40(H) 37(H)  Creatinine 0.61 - 1.24 mg/dL 0.97 0.94 0.87  BUN/Creat Ratio 6 - 22 (calc) - - -  Sodium 135 - 145 mmol/L 148(H) 146(H) 145  Potassium 3.5 - 5.1 mmol/L 4.1 4.2 4.1  Chloride 98 - 111 mmol/L 103 102 102  CO2 22 - 32 mmol/L 34(H) 35(H) 34(H)  Calcium 8.9 - 10.3 mg/dL 7.5(L) 7.5(L) 7.4(L)    ABG    Component Value Date/Time   PHART 7.412 07/28/2019 2344   PCO2ART 65.1 (HH) 07/27/2019 2344   PO2ART 80.0 (L) 08/11/2019 2344   HCO3 41.7 (H) 08/05/2019 2344   TCO2 44 (H) 08/05/2019 2344   O2SAT 96.0 07/25/2019 2344       Melodie Bouillon, MD 08/02/2019 6:35 PM

## 2019-08-02 NOTE — Progress Notes (Addendum)
Nutrition Follow-up  DOCUMENTATION CODES:   Not applicable  INTERVENTION:   Tube feeding: - Vital AF 1.2 @ 45 ml/hr (1080 ml/day) via OG tube - Pro-stat 30 ml BID  Tube feeding regimen provides 1496 kcal, 111 grams of protein, and 876 ml of H2O.   NUTRITION DIAGNOSIS:   Inadequate oral intake related to inability to eat as evidenced by NPO status.  Ongoing, being addressed via TF  GOAL:   Patient will meet greater than or equal to 90% of their needs  Met via TF  MONITOR:   Vent status, TF tolerance, Labs, Weight trends, I & O's, Skin  REASON FOR ASSESSMENT:   Consult Enteral/tube feeding initiation and management  ASSESSMENT:   84 year old male who presented on 9/08 with AMS after a fall. PMH of atrial fibrillation, COPD, HLD, HTN, dementia. Pt experienced cardiac arrest in the ED and CPR was initiated with ROSC after 20 minutes. Pt developed multiple anterior rib fractures, pneumothorax, and extensive subcutaneous emphysema. TTM 36 degrees initiated. Chest tube placed.  Discussed pt with RN and during ICU rounds. Per CCM, okay to start TF today. Consulted placed.  OG tube in place, currently to low intermittent suction.  Patient is currently intubated on ventilator support MV: 3.7 L/min Temp (24hrs), Avg:97.1 F (36.2 C), Min:96.8 F (36 C), Max:98.1 F (36.7 C) BP (a-line): 123/51 MAP (a-line): 76  Drips: Fentanyl: 6 ml/hr Heparin: 9.5 ml/hr D5 in NS: 75 ml/hr  Medications reviewed and include: Protonix, IV abx  Labs reviewed: sodium 148, elevated LFTs, hemoglobin 9.1 CBG's: 59-100 x 24 hours  UOP: 485 ml x 24 hours OGT: 100 ml x 24 hours CT: 330 ml x 24 hours I/O's: +1.4 L since admit  Diet Order:   Diet Order            Diet NPO time specified  Diet effective now              EDUCATION NEEDS:   No education needs have been identified at this time  Skin:  Skin Assessment: Skin Integrity Issues: Skin Integrity Issues: Other: wound  to head, skin tear to left arm  Last BM:  no documented BM  Height:   Ht Readings from Last 1 Encounters:  08/01/19 6' (1.829 m)    Weight:   Wt Readings from Last 1 Encounters:  08/01/19 73.5 kg    Ideal Body Weight:  80.9 kg  BMI:  Body mass index is 21.97 kg/m.  Estimated Nutritional Needs:   Kcal:  1488  Protein:  95-110 grams  Fluid:  >/= 1.5 L    Gaynell Face, MS, RD, LDN Inpatient Clinical Dietitian Pager: [phone] Weekend/After Hours: [phone]

## 2019-08-02 NOTE — Progress Notes (Signed)
[place] for Heparin Indication: afib, r/o PE No Known Allergies  Patient Measurements: Height: 6' (182.9 cm) Weight: 162 lb (73.5 kg) IBW/kg (Calculated) : 77.6 Heparin Dosing Weight: 74kg  Vital Signs: Temp: 98.1 F (36.7 C) (09/10 0600) Temp Source: Bladder (09/10 0600) BP: 100/71 (09/10 1200) Pulse Rate: 97 (09/10 1200)  Labs: Recent Labs    07/24/2019 1726  07/24/2019 2344 08/01/19 0429 08/01/19 0452 08/01/19 2349 08/02/19 0432 08/02/19 0804 08/02/19 1046  HGB 9.0*   < > 10.5*  --  10.2*  --  9.1*  --   --   HCT 32.2*   < > 31.0*  --  34.6*  --  30.5*  --   --   PLT 241  --   --   --  221  --  201  --   --   APTT  --   --   --  25  --   --   --   --   --   LABPROT  --   --   --  16.7*  --   --   --   --   --   INR  --   --   --  1.4*  --   --   --   --   --   HEPARINUNFRC  --   --   --   --   --  0.24*  --   --  0.23*  CREATININE 0.88  --   --   --   --  0.87 0.94 0.97  --   TROPONINIHS 11  --   --  193*  --   --   --  108*  --    < > = values in this interval not displayed.    Estimated Creatinine Clearance: 61 mL/min (by C-G formula based on SCr of 0.97 mg/dL).   Medical History: Past Medical History:  Diagnosis Date  . Asthma   . Atrial fibrillation ([place]) 03/31/2019   New onset a fib during 01/2019 admission, d/c on xarelto per cardiology recommendation  . Colon polyps   . COPD (chronic obstructive pulmonary disease) ([place])    3L home O2  . Diverticulosis   . Enlarged prostate    with elevated PSA  . Hyperlipidemia   . Hypertension   . Prediabetes 2011  . Pulmonary hypertension ([place]) 2012   Assessment: 84 year old male with history of afib on xarelto prior to admit, now post arrest. He unfortunately suffered rib fractures from CPR. Patient has not been taking Xarelto for the last week or so due to running out of medication. Given potential for pulmonary embolism and patient's history of A-fib, pharmacy consulted to  dose IV heparin.   Patient's heparin level was 0.23 on 09/10 @ 1046 on heparin rate of 950 units/hr. Patient likely clearing more heparin now that he is rewarming. Hg is low but stable at 9.1. Plts are wnls. No issues with infusion or bleeding noted per RN.  Goal of Therapy:  Heparin level 0.3-0.7 units/mL Monitor platelets by anticoagulation protocol: Yes   Plan:  Increase heparin infusion to 1100 units/hr Re-check heparin level in 8 hours Check daily heparin level and CBC while on heparin Monitor for signs/symptoms of bleeding  Sherren Kerns, PharmD PGY1 Acute Care Pharmacy Resident [phone]

## 2019-08-02 NOTE — Progress Notes (Signed)
  Subjective: intubated  Objective: Vital signs in last 24 hours: Temp:  [96.4 F (35.8 C)-98.1 F (36.7 C)] 98.1 F (36.7 C) (09/10 0600) Pulse Rate:  [86-106] 92 (09/10 0720) Cardiac Rhythm: Normal sinus rhythm (09/10 0600) Resp:  [0-25] 22 (09/10 0720) BP: (90-109)/(49-66) 109/61 (09/10 0720) SpO2:  [96 %-100 %] 98 % (09/10 0723) Arterial Line BP: (88-148)/(42-84) 121/57 (09/10 0700) FiO2 (%):  [50 %-60 %] 50 % (09/10 0723) Weight:  [73.5 kg] 73.5 kg (09/09 1400)  Hemodynamic parameters for last 24 hours:    Intake/Output from previous day: 09/09 0701 - 09/10 0700 In: 2532.5 [I.V.:1684.9; IV Piggyback:847.6] Out: 915 [Urine:485; Emesis/NG output:100; Chest Tube:330] Intake/Output this shift: No intake/output data recorded.  Neurologic: opens eyes, moves extremities Heart: regular rate and rhythm Lungs: air leak noise on R + air leak. SQ emphysema decreased  Lab Results: Recent Labs    08/01/19 0452 08/02/19 0432  WBC 19.8* 12.9*  HGB 10.2* 9.1*  HCT 34.6* 30.5*  PLT 221 201   BMET:  Recent Labs    08/01/19 2349 08/02/19 0432  NA 145 146*  K 4.1 4.2  CL 102 102  CO2 34* 35*  GLUCOSE 97 96  BUN 37* 40*  CREATININE 0.87 0.94  CALCIUM 7.4* 7.5*    PT/INR:  Recent Labs    08/01/19 0429  LABPROT 16.7*  INR 1.4*   ABG    Component Value Date/Time   PHART 7.412 08/13/2019 2344   HCO3 41.7 (H) 07/28/2019 2344   TCO2 44 (H) 07/27/2019 2344   O2SAT 96.0 08/12/2019 2344   CBG (last 3)  Recent Labs    08/02/19 0416 08/02/19 0438 08/02/19 0752  GLUCAP 59* 88 84    Assessment/Plan: S/P  -Pneumothorax with extensive SQ empysema SQ emphysema has improved dramatically CXR ordered but not done yet this AM Decrease suction to 20 cm of water Management per CCM   LOS: 2 days    Melrose Nakayama 08/02/2019

## 2019-08-03 ENCOUNTER — Inpatient Hospital Stay (HOSPITAL_COMMUNITY): Payer: Medicare HMO

## 2019-08-03 LAB — CBC
HCT: 27.4 % — ABNORMAL LOW (ref 39.0–52.0)
Hemoglobin: 7.9 g/dL — ABNORMAL LOW (ref 13.0–17.0)
MCH: 28.8 pg (ref 26.0–34.0)
MCHC: 28.8 g/dL — ABNORMAL LOW (ref 30.0–36.0)
MCV: 100 fL (ref 80.0–100.0)
Platelets: 195 10*3/uL (ref 150–400)
RBC: 2.74 MIL/uL — ABNORMAL LOW (ref 4.22–5.81)
RDW: 15.6 % — ABNORMAL HIGH (ref 11.5–15.5)
WBC: 14 10*3/uL — ABNORMAL HIGH (ref 4.0–10.5)
nRBC: 0 % (ref 0.0–0.2)

## 2019-08-03 LAB — BASIC METABOLIC PANEL
Anion gap: 6 (ref 5–15)
BUN: 46 mg/dL — ABNORMAL HIGH (ref 8–23)
CO2: 34 mmol/L — ABNORMAL HIGH (ref 22–32)
Calcium: 7.7 mg/dL — ABNORMAL LOW (ref 8.9–10.3)
Chloride: 107 mmol/L (ref 98–111)
Creatinine, Ser: 0.7 mg/dL (ref 0.61–1.24)
GFR calc Af Amer: >60 mL/min (ref >60)
GFR calc non Af Amer: >60 mL/min (ref >60)
Glucose, Bld: 121 mg/dL — ABNORMAL HIGH (ref 70–99)
Potassium: 3.9 mmol/L (ref 3.5–5.1)
Sodium: 147 mmol/L — ABNORMAL HIGH (ref 135–145)

## 2019-08-03 LAB — POCT I-STAT 7, (LYTES, BLD GAS, ICA,H+H)
Acid-Base Excess: 9 mmol/L — ABNORMAL HIGH (ref 0.0–2.0)
Bicarbonate: 37.8 mmol/L — ABNORMAL HIGH (ref 20.0–28.0)
Calcium, Ion: 1.23 mmol/L (ref 1.15–1.40)
HCT: 22 % — ABNORMAL LOW (ref 39.0–52.0)
Hemoglobin: 7.5 g/dL — ABNORMAL LOW (ref 13.0–17.0)
O2 Saturation: 94 %
Patient temperature: 36.6
Potassium: 3.9 mmol/L (ref 3.5–5.1)
Sodium: 149 mmol/L — ABNORMAL HIGH (ref 135–145)
TCO2: 40 mmol/L — ABNORMAL HIGH (ref 22–32)
pCO2 arterial: 81.8 mmHg (ref 32.0–48.0)
pH, Arterial: 7.271 — ABNORMAL LOW (ref 7.350–7.450)
pO2, Arterial: 82 mmHg — ABNORMAL LOW (ref 83.0–108.0)

## 2019-08-03 LAB — HEPARIN LEVEL (UNFRACTIONATED)
Heparin Unfractionated: 0.29 IU/mL — ABNORMAL LOW (ref 0.30–0.70)
Heparin Unfractionated: 0.53 IU/mL (ref 0.30–0.70)

## 2019-08-03 LAB — GLUCOSE, CAPILLARY
Glucose-Capillary: 102 mg/dL — ABNORMAL HIGH (ref 70–99)
Glucose-Capillary: 105 mg/dL — ABNORMAL HIGH (ref 70–99)
Glucose-Capillary: 111 mg/dL — ABNORMAL HIGH (ref 70–99)
Glucose-Capillary: 112 mg/dL — ABNORMAL HIGH (ref 70–99)
Glucose-Capillary: 114 mg/dL — ABNORMAL HIGH (ref 70–99)
Glucose-Capillary: 115 mg/dL — ABNORMAL HIGH (ref 70–99)

## 2019-08-03 LAB — PROCALCITONIN: Procalcitonin: 0.33 ng/mL

## 2019-08-03 LAB — LACTIC ACID, PLASMA: Lactic Acid, Venous: 1 mmol/L (ref 0.5–1.9)

## 2019-08-03 LAB — MAGNESIUM: Magnesium: 2.3 mg/dL (ref 1.7–2.4)

## 2019-08-03 MED ORDER — NOREPINEPHRINE 4 MG/250ML-% IV SOLN
0.0000 ug/min | INTRAVENOUS | Status: DC
  Administered 2019-08-03: 2 ug/min via INTRAVENOUS
  Administered 2019-08-04: 38 ug/min via INTRAVENOUS
  Administered 2019-08-04: 40 ug/min via INTRAVENOUS
  Administered 2019-08-04: 28 ug/min via INTRAVENOUS
  Administered 2019-08-04: 30 ug/min via INTRAVENOUS
  Administered 2019-08-04: 40 ug/min via INTRAVENOUS
  Administered 2019-08-04: 07:00:00 38 ug/min via INTRAVENOUS
  Administered 2019-08-04: 35 ug/min via INTRAVENOUS
  Administered 2019-08-04: 17:00:00 28 ug/min via INTRAVENOUS
  Administered 2019-08-05: 04:00:00 14 ug/min via INTRAVENOUS
  Administered 2019-08-05: 6 ug/min via INTRAVENOUS
  Filled 2019-08-03 (×6): qty 250
  Filled 2019-08-03: qty 500
  Filled 2019-08-03: qty 250
  Filled 2019-08-03: qty 500
  Filled 2019-08-03 (×4): qty 250

## 2019-08-03 MED ORDER — CHLORHEXIDINE GLUCONATE 0.12 % MT SOLN
15.0000 mL | Freq: Two times a day (BID) | OROMUCOSAL | Status: DC
  Administered 2019-08-04 (×2): 15 mL via OROMUCOSAL
  Filled 2019-08-03: qty 15

## 2019-08-03 MED ORDER — SODIUM CHLORIDE 0.9 % IV BOLUS
500.0000 mL | Freq: Once | INTRAVENOUS | Status: AC
  Administered 2019-08-03: 03:00:00 500 mL via INTRAVENOUS

## 2019-08-03 MED ORDER — PHENYLEPHRINE HCL-NACL 10-0.9 MG/250ML-% IV SOLN
0.0000 ug/min | INTRAVENOUS | Status: DC
  Administered 2019-08-03: 08:00:00 30 ug/min via INTRAVENOUS
  Administered 2019-08-03: 20 ug/min via INTRAVENOUS
  Administered 2019-08-03: 50 ug/min via INTRAVENOUS
  Filled 2019-08-03 (×3): qty 250

## 2019-08-03 MED ORDER — ORAL CARE MOUTH RINSE
15.0000 mL | Freq: Two times a day (BID) | OROMUCOSAL | Status: DC
  Administered 2019-08-03: 15 mL via OROMUCOSAL

## 2019-08-03 MED ORDER — PHENYLEPHRINE HCL-NACL 40-0.9 MG/250ML-% IV SOLN
0.0000 ug/min | INTRAVENOUS | Status: DC
  Administered 2019-08-03 (×2): 200 ug/min via INTRAVENOUS
  Administered 2019-08-04 (×7): 400 ug/min via INTRAVENOUS
  Filled 2019-08-03 (×32): qty 250

## 2019-08-03 NOTE — Progress Notes (Signed)
Contacted Dr. Lamonte Sakai with updated ABG and he stated to leave pt on bipap over night and continue to monitor.  Ph-7.27 CO2- 81 O2-84 HCO3- 37.8

## 2019-08-03 NOTE — Progress Notes (Signed)
ANTICOAGULATION CONSULT NOTE  Pharmacy Consult for Heparin Indication: afib, r/o PE No Known Allergies  Patient Measurements: Height: 6' (182.9 cm) Weight: 162 lb (73.5 kg) IBW/kg (Calculated) : 77.6 Heparin Dosing Weight: 74kg  Vital Signs: Temp: 98.8 F (37.1 C) (09/11 0745) Temp Source: Bladder (09/11 0745) BP: 101/61 (09/11 0830) Pulse Rate: 98 (09/11 0845)  Labs: Recent Labs    08/03/2019 1726  08/01/19 0429 08/01/19 0452  08/02/19 0432 08/02/19 0804 08/02/19 1046 08/02/19 2052 08/03/19 0303 08/03/19 0641 08/03/19 0700  HGB 9.0*   < >  --  10.2*  --  9.1*  --   --   --  7.9*  --   --   HCT 32.2*   < >  --  34.6*  --  30.5*  --   --   --  27.4*  --   --   PLT 241  --   --  221  --  201  --   --   --  195  --   --   APTT  --   --  25  --   --   --   --   --   --   --   --   --   LABPROT  --   --  16.7*  --   --   --   --   --   --   --   --   --   INR  --   --  1.4*  --   --   --   --   --   --   --   --   --   HEPARINUNFRC  --   --   --   --    < >  --   --  0.23* 0.29*  --  0.29*  --   CREATININE 0.88  --   --   --    < > 0.94 0.97  --  0.79  --   --  0.70  TROPONINIHS 11  --  193*  --   --   --  108*  --   --   --   --   --    < > = values in this interval not displayed.    Estimated Creatinine Clearance: 74 mL/min (by C-G formula based on SCr of 0.7 mg/dL).   Medical History: Past Medical History:  Diagnosis Date  . Asthma   . Atrial fibrillation ([place]) 03/31/2019   New onset a fib during 01/2019 admission, d/c on xarelto per cardiology recommendation  . Colon polyps   . COPD (chronic obstructive pulmonary disease) ([place])    3L home O2  . Diverticulosis   . Enlarged prostate    with elevated PSA  . Hyperlipidemia   . Hypertension   . Prediabetes 2011  . Pulmonary hypertension ([place]) 2012   Assessment: 84 year old male with history of afib on xarelto prior to admit, now post arrest. He unfortunately suffered rib fractures from CPR. Patient has not been  taking Xarelto for the last week or so due to running out of medication. Given potential for pulmonary embolism and patient's history of A-fib, pharmacy consulted to dose IV heparin.   Heparin level is slightly subtherapeutic at 0.29 despite a rate increase in the evening (HL 0.29 <<0.29<< 0.23, goal of 0.3-0.7). No bleeding or issues noted per discussion with RN.   Goal of Therapy:  Heparin level 0.3-0.7 units/mL Monitor  platelets by anticoagulation protocol: Yes   Plan:  - Increase Heparin drip rate to 1350 units/hr (13.5 ml/hr) - Will continue to monitor for any signs/symptoms of bleeding and will follow up with heparin level in 8 hours  - Monitor heparin level and CBC daily - Watch for signs/symptoms of bleeding  Thank you for allowing pharmacy to be a part of this patient's care.  Sherren Kerns, PharmD PGY1 Acute Care Pharmacy Resident [phone] 08/03/2019 9:00 AM   **Pharmacist phone directory can now be found on [email or web address] ([email or web address]).  Listed under [place].

## 2019-08-03 NOTE — Progress Notes (Addendum)
eLink Physician-Brief Progress Note Patient Name: Kyle Mosley DOB: [birth year] MRN: [id number]   Date of Service  08/03/2019  HPI/Events of Note  Informed by RN that patient's pressor requirements have been going up.  He is on 275mcg of neosynephrine and yet MAP drops to <60 intermittently.  On my video assessment, pt is on BIPAP.  BP 95/72, MAP of MAP, HR 94.  eICU Interventions  Keep BIPAP in place. Pt is doing ok for now on neosynephrine.  Start on levophed to keep MAP >65.       Intervention Category Intermediate Interventions: Hypotension - evaluation and management  Elsie Lincoln 08/03/2019, 8:42 PM   11:17 PM Pt is tachycardic on levophed at 47mcg and neosynephrine was at 248mcg. MAPs remain <65.  Plan> Increase max dose of neosynephrine to 400.  Titrate levophed down.    11:56 PM Pt remains tachycardic, pressor requirements going up. Plan> Recheck ABG stat.  Continue pressors.  Family informed of change in clinical situation and they are agreeable with reintubation. Ground team made aware.     2:59 AM Pt is now intubated.  He is on levophed and neosynephrine. He is tachycardic.  He was noted to be anemic with hgb 5.6. Pt's BP responded to blood transfusion.  Plan> Transfuse second unit of blood now.  Give 25 g albumin.  Slowly titrate levophed to keep MAP >65.  4:56 AM Pt is in refractory shock. He is on Cefepime for pneumonia.  He was given 2 units pRBCs and albumin but remains hypotensive.   Plan> Add on vasopressin to levophed and neosynephrine.  Start on stress dose steroid- hydrocortisone 50mg  q6hrs.  5:56 AM Pt hypoglycemic.  Urine output also documented at 250cc.  Plan> Start on D5 1/2 NS@75ml /hr.

## 2019-08-03 NOTE — Progress Notes (Signed)
RT NOTE: RT administered breathing treatment and noticed patient not responding and mental status changed. Stat ABG was drawn which showed pH of 7.1 and CO2 of over 97. MD immediatly  made aware. Patient placed on NIV PS through vent. Per MD Dr. Lamonte Sakai settings are PS above PEEP 14, PEEP 2, and 50% FIO2. Vitals are stable and sats are currently 94%. Repeat ABG to be drawn at 18:30. RT will continue to monitor.

## 2019-08-03 NOTE — Progress Notes (Signed)
Patient's blood pressure continues to drop and patient is requiring increased pressor needs.  Patient has been lethargic but responsive to RN and interacting.  Patient now on 160 mcg of phenylephrine, eyes rolling back in patient's head, and he is less responsive.  He is still making eye contact with RN and answering with grunts/one word answers but not as responsive as previously.  MD Byrum made aware.  ABG ordered, results discussed with MD Byrum and MD to bedside to discuss status with patient's family

## 2019-08-03 NOTE — Progress Notes (Signed)
Discussed visitation policy with patient's sons, Konrad Desai and Amelia Oleson.  Patient previously intubated and sedated so visitation policy allowed switching off between both family members.  Now that the patient is extubated and doing well, visitation exception is no longer applicable.   Visitation policy is now [id number] - [id number] with only one support person allowed to visit and this is the only visitor allowed during the rest of the patient's hospital stay.   Both sons voiced understanding and stated they would discuss this and advise who the support person will be moving forward.  Patsi Sears also advised RN that the family has discussed code status as well as discussed this with Mr. Salk and they would like the patient to be re-intubated if necessary.  Family is still in agreement for limited code with no CPR and no ACLS medications.

## 2019-08-03 NOTE — Progress Notes (Signed)
eLink Physician-Brief Progress Note Patient Name: Kyle Mosley DOB: September 29, 1935 MRN: [id number]   Date of Service  08/03/2019  HPI/Events of Note  Hypotension. Pt has normal LV function and a history of Atrial Fibrillation  eICU Interventions  Phenylephrine infusion ordered as needed to keep MAP > 65 mmHg, 500 ml iv saline bolus.        Kerry Kass Rj Pedrosa 08/03/2019, 2:44 AM

## 2019-08-03 NOTE — Evaluation (Signed)
Clinical/Bedside Swallow Evaluation Patient Details  Name: Kyle Mosley MRN: [id number] Date of Birth: Jun 29, 1935  Today's Date: 08/03/2019 Time: SLP Start Time (ACUTE ONLY): 1330 SLP Stop Time (ACUTE ONLY): 1343 SLP Time Calculation (min) (ACUTE ONLY): 13 min  Past Medical History:  Past Medical History:  Diagnosis Date  . Asthma   . Atrial fibrillation ([place]) 03/31/2019   New onset a fib during 01/2019 admission, d/c on xarelto per cardiology recommendation  . Colon polyps   . COPD (chronic obstructive pulmonary disease) ([place])    3L home O2  . Diverticulosis   . Enlarged prostate    with elevated PSA  . Hyperlipidemia   . Hypertension   . Prediabetes 2011  . Pulmonary hypertension ([place]) 2012   Past Surgical History:  Past Surgical History:  Procedure Laterality Date  . COLONOSCOPY  03/21/2007   Dr. Jonny Ruiz diverticula, normal rectum  . COLONOSCOPY  05/04/2012   Procedure: COLONOSCOPY;  Surgeon: Daneil Dolin, MD;  Location: [facility];  Service: Endoscopy;  Laterality: N/A;  7:30  . PROSTATE BIOPSY     HPI:  84 year old male presenting with syncopal episode, weakness, and lower leg swelling who decompensated in ER, found unresponsive in asystolic cardiac arrest, ROSC after 20 mins.  Developed severe diffuse subcutaneous air during CPR without obvious pneumothorax on CXR.  On vasopressor support for hypotension. Intubated from 9/8 to 9/11.    Assessment / Plan / Recommendation Clinical Impression  Pt demonstrates significant deconditioning and weakness, though vocal quality after extubation is relatively clear. When given trials of PO he is immediately responsive, but laryngeal elevation underpalapation is slow and sluggish, with multiple swallows, and subjectively appears delayed. No coughing observed, but given presentation would like to see pt a little stronger prior to offering larger quantities of thin liquids as aspiration risk is moderate. Will f/u for PO trials  at bedside to potentially advance diet or proceed with MBS if needed. For now pt may have a few bites of puree intermittently (with whole meds if needed) or ice chips as desired. Discussed with RN and pts family.  SLP Visit Diagnosis: Dysphagia, oral phase (R13.11)    Aspiration Risk  Moderate aspiration risk    Diet Recommendation NPO except meds;Ice chips PRN after oral care   Medication Administration: Whole meds with puree Supervision: Full supervision/cueing for compensatory strategies    Other  Recommendations Oral Care Recommendations: Oral care QID Other Recommendations: Have oral suction available   Follow up Recommendations Skilled Nursing facility      Frequency and Duration min 2x/week  2 weeks       Prognosis        Swallow Study   General HPI: 84 year old male presenting with syncopal episode, weakness, and lower leg swelling who decompensated in ER, found unresponsive in asystolic cardiac arrest, ROSC after 20 mins.  Developed severe diffuse subcutaneous air during CPR without obvious pneumothorax on CXR.  On vasopressor support for hypotension. Intubated from 9/8 to 9/11.  Type of Study: Bedside Swallow Evaluation Diet Prior to this Study: NPO Temperature Spikes Noted: No Respiratory Status: Venti-mask History of Recent Intubation: Yes Length of Intubations (days): 3 days Date extubated: 08/03/19 Behavior/Cognition: Alert;Cooperative;Pleasant mood Oral Cavity Assessment: Within Functional Limits Oral Care Completed by SLP: No Oral Cavity - Dentition: Adequate natural dentition Self-Feeding Abilities: Total assist Patient Positioning: Upright in bed Baseline Vocal Quality: Normal Volitional Cough: Weak Volitional Swallow: Able to elicit    Oral/Motor/Sensory Function Overall  Oral Motor/Sensory Function: Within functional limits   Ice Chips Ice chips: Impaired Pharyngeal Phase Impairments: Suspected delayed Swallow   Thin Liquid Thin Liquid:  Impaired Presentation: Straw Pharyngeal  Phase Impairments: Multiple swallows;Suspected delayed Swallow    Nectar Thick Nectar Thick Liquid: Not tested   Honey Thick Honey Thick Liquid: Not tested   Puree Puree: Impaired Pharyngeal Phase Impairments: Multiple swallows   Solid     Solid: Not tested     Herbie Baltimore, MA CCC-SLP  Acute Rehabilitation Services Pager [phone] Office [phone]  Lynann Beaver 08/03/2019,1:50 PM

## 2019-08-03 NOTE — Progress Notes (Signed)
50 mL of fentanyl wasted in steri-container witnessed by Greta Doom.

## 2019-08-03 NOTE — Evaluation (Signed)
Physical Therapy Evaluation Patient Details Name: Kyle Mosley MRN: [id number] DOB: 04/29/1935 Today's Date: 08/03/2019   History of Present Illness  84 year old male presenting with syncopal episode, weakness, and lower leg swelling who decompensated in ER, found unresponsive in asystolic cardiac arrest, ROSC after 20 mins.  Developed severe diffuse subcutaneous air during CPR without obvious pneumothorax on CXR.  On vasopressor support for hypotension. Intubated from 9/8 to 9/11.  Clinical Impression  Patient presents with decreased independence with mobility due to deficits listed in PT problem list.  Currently requires +2 A for up to EOB and not well tolerated due to decreased BP and decreased responsiveness.  Patient previously limited but able to complete BADL's per son.  Feel he will need SNF level rehab at d/c.  PT to follow acutely.    Follow Up Recommendations SNF    Equipment Recommendations  Other (comment)(TBA)    Recommendations for Other Services       Precautions / Restrictions Precautions Precautions: Fall Precaution Comments: watch BP      Mobility  Bed Mobility Overal bed mobility: Needs Assistance Bed Mobility: Sit to Supine       Sit to supine: Max assist;HOB elevated;+2 for physical assistance   General bed mobility comments: assisted legs off bed, assist for trunk to sit upright, RN assisted to scoot hips, then to supine pt fell back with trunk while PT lifing legs due to decreased responsiveness with low BP  Transfers                 General transfer comment: NT/unable  Ambulation/Gait                Stairs            Wheelchair Mobility    Modified Rankin (Stroke Patients Only)       Balance Overall balance assessment: Needs assistance   Sitting balance-Leahy Scale: Poor Sitting balance - Comments: sitting with min A at least max A when decreased responsiveness                                      Pertinent Vitals/Pain Pain Assessment: Faces Faces Pain Scale: Hurts little more Pain Location: generalized in extremities with movement Pain Descriptors / Indicators: Grimacing;Moaning Pain Intervention(s): Monitored during session;Repositioned;Limited activity within patient's tolerance    Home Living Family/patient expects to be discharged to:: Private residence Living Arrangements: Spouse/significant other Available Help at Discharge: Family;Friend(s) Type of Home: House Home Access: Stairs to enter Entrance Stairs-Rails: Right Entrance Stairs-Number of Steps: 3 Home Layout: One level Home Equipment: Walker - 2 wheels;Cane - single point      Prior Function Level of Independence: Independent with assistive device(s)         Comments: limited use of cane, completed BADL's on his own, son's help intermittent for IADL's     Hand Dominance        Extremity/Trunk Assessment   Upper Extremity Assessment Upper Extremity Assessment: LUE deficits/detail;RUE deficits/detail RUE Deficits / Details: AAROM limited with weeping/skin tears on both arms wrapped with kerlix LUE Deficits / Details: AAROM limited with weeping/skin tears on both arms wrapped with kerlix; assisted RN holding arm while she wrapped    Lower Extremity Assessment Lower Extremity Assessment: LLE deficits/detail;RLE deficits/detail;Difficult to assess due to impaired cognition RLE Deficits / Details: [facility], but limited active antigravity movement seen. LLE Deficits / Details:  AAROM WFL, but limited active antigravity movement seen.    Cervical / Trunk Assessment Cervical / Trunk Assessment: Kyphotic  Communication   Communication: Expressive difficulties(limited verbalizations on venturi mask after extubation)  Cognition Arousal/Alertness: Lethargic Behavior During Therapy: Flat affect Overall Cognitive Status: Difficult to assess                                 General  Comments: mumbling and limited verbalizations, does respond when asked if okay with head nods, but son present to give functional history      General Comments General comments (skin integrity, edema, etc.): BP in supine 84/56, sitting 70/40, back in supine 103/53    Exercises     Assessment/Plan    PT Assessment Patient needs continued PT services  PT Problem List Decreased strength;Decreased activity tolerance;Decreased mobility;Decreased safety awareness;Cardiopulmonary status limiting activity;Decreased cognition;Decreased knowledge of use of DME;Decreased balance;Decreased range of motion       PT Treatment Interventions DME instruction;Therapeutic activities;Balance training;Patient/family education;Neuromuscular re-education;Therapeutic exercise;Functional mobility training;Gait training    PT Goals (Current goals can be found in the Care Plan section)  Acute Rehab PT Goals Patient Stated Goal: To return home after rehab PT Goal Formulation: With patient/family Time For Goal Achievement: 08/17/19 Potential to Achieve Goals: Fair    Frequency Min 2X/week   Barriers to discharge        Co-evaluation               AM-PAC PT "6 Clicks" Mobility  Outcome Measure Help needed turning from your back to your side while in a flat bed without using bedrails?: Total Help needed moving from lying on your back to sitting on the side of a flat bed without using bedrails?: Total Help needed moving to and from a bed to a chair (including a wheelchair)?: Total Help needed standing up from a chair using your arms (e.g., wheelchair or bedside chair)?: Total Help needed to walk in hospital room?: Total Help needed climbing 3-5 steps with a railing? : Total 6 Click Score: 6    End of Session   Activity Tolerance: Patient limited by fatigue;Treatment limited secondary to medical complications (Comment)(low BP in sitting) Patient left: in bed;with call bell/phone within reach;with  family/visitor present;with nursing/sitter in room   PT Visit Diagnosis: Muscle weakness (generalized) (M62.81);Other abnormalities of gait and mobility (R26.89)    Time: [id number] PT Time Calculation (min) (ACUTE ONLY): 23 min   Charges:   PT Evaluation $PT Eval High Complexity: 1 High PT Treatments $Therapeutic Activity: 8-22 mins        Kyle Mosley, [place] Acute Rehabilitation Services [phone] 08/03/2019   Kyle Mosley 08/03/2019, 3:54 PM

## 2019-08-03 NOTE — Progress Notes (Signed)
Patient ID: Kyle Mosley, male   DOB: [birth year], 84 y.o.   MRN: [id number] TCTS  Extubated today. Now on bipap.   Has continuous 1 chamber air leak from chest tube. Reportedly 5+ air leak while on vent.  CXR this am showed no significant ptx, stable subcutaneous emphysema.  He has required escalating dose of neo today to support BP.   If he stabilizes and air leak persists could consider endobronchial valves.

## 2019-08-03 NOTE — Progress Notes (Signed)
ANTICOAGULATION CONSULT NOTE  Pharmacy Consult for Heparin Indication: afib, r/o PE No Known Allergies  Patient Measurements: Height: 6' (182.9 cm) Weight: 162 lb (73.5 kg) IBW/kg (Calculated) : 77.6 Heparin Dosing Weight: 74kg  Vital Signs: Temp: 97.9 F (36.6 C) (09/11 1715) Temp Source: Bladder (09/11 1700) BP: 113/72 (09/11 1700) Pulse Rate: 97 (09/11 1715)  Labs: Recent Labs    08/01/19 0429 08/01/19 0452  08/02/19 0432 08/02/19 0804  08/02/19 2052 08/03/19 0303 08/03/19 0641 08/03/19 0700 08/03/19 1657  HGB  --  10.2*  --  9.1*  --   --   --  7.9*  --   --   --   HCT  --  34.6*  --  30.5*  --   --   --  27.4*  --   --   --   PLT  --  221  --  201  --   --   --  195  --   --   --   APTT 25  --   --   --   --   --   --   --   --   --   --   LABPROT 16.7*  --   --   --   --   --   --   --   --   --   --   INR 1.4*  --   --   --   --   --   --   --   --   --   --   HEPARINUNFRC  --   --    < >  --   --    < > 0.29*  --  0.29*  --  0.53  CREATININE  --   --    < > 0.94 0.97  --  0.79  --   --  0.70  --   TROPONINIHS 193*  --   --   --  108*  --   --   --   --   --   --    < > = values in this interval not displayed.    Estimated Creatinine Clearance: 74 mL/min (by C-G formula based on SCr of 0.7 mg/dL).   Medical History: Past Medical History:  Diagnosis Date  . Asthma   . Atrial fibrillation ([place]) 03/31/2019   New onset a fib during 01/2019 admission, d/c on xarelto per cardiology recommendation  . Colon polyps   . COPD (chronic obstructive pulmonary disease) ([place])    3L home O2  . Diverticulosis   . Enlarged prostate    with elevated PSA  . Hyperlipidemia   . Hypertension   . Prediabetes 2011  . Pulmonary hypertension ([place]) 2012   Assessment: 84 year old male with history of afib on xarelto prior to admit, now post arrest. He unfortunately suffered rib fractures from CPR. Patient has not been taking Xarelto for the last week or so due to running out of  medication. Given potential for pulmonary embolism and patient's history of A-fib, pharmacy consulted to dose IV heparin.   Heparin level is therapeutic at 0.52 (HL goal of 0.3-0.7). No bleeding or issues noted per RN.   Goal of Therapy:  Heparin level 0.3-0.7 units/mL Monitor platelets by anticoagulation protocol: Yes   Plan:  - Continue Heparin drip rate to 1350 units/hr (13.5 ml/hr) - Monitor heparin level and CBC daily - Watch for signs/symptoms of bleeding  Thank you for allowing pharmacy to be a part of this patient's care.  Alanda Slim, PharmD, [facility] Clinical Pharmacist Please see AMION for all Pharmacists' Contact Phone Numbers 08/03/2019, 5:54 PM

## 2019-08-03 NOTE — Procedures (Signed)
Extubation Procedure Note  Patient Details:   Name: Kyle Mosley DOB: [birth year] MRN: [id number]   Airway Documentation:    Vent end date: 08/03/19 Vent end time: 1040   Evaluation  O2 sats: stable throughout Complications: No apparent complications Patient did tolerate procedure well. Bilateral Breath Sounds: Diminished   Yes   Patient extubated per order to 4L [place] with no apparent complications. Positive cuff leak was noted prior to extubation. Patient is alert and oriented and is able to weakly speak. Vitals are stable. RT will continue to monitor.   Stephens Shreve Clyda Greener 08/03/2019, 10:43 AM

## 2019-08-03 NOTE — Progress Notes (Signed)
NAME:  Kyle Mosley, MRN:  [id number], DOB:  12/27/1934, LOS: 3 ADMISSION DATE:  08/07/2019, CHIEF COMPLAINT:  Cardiac arrest   Brief History   84 year old male presenting with syncopal episode, weakness, and lower leg swelling who decompensated in ER, found unresponsive in asystolic cardiac arrest, ROSC after 20 mins.  Developed severe diffuse subcutaneous air during CPR without obvious pneumothorax on CXR.  On vasopressor support for hypotension.  PCCM called for admit.   History of present illness   HPI obtained from medical chart review as patient is unresponsive on mechanical ventilation.    84 year old male with prior history of COPD on home O2/ steroid dependent, asthma, Afib on Xarelto, HFpEF, HTN, HLD, and pulmonary hypertension who presented from home after progressive lower extremity swelling for 3 weeks, using his inhaler more frequently but not complaining of SOB, who became weak today and collapsed.  Possible recent falls at home.    On arrival to ER, patient was alert and oriented.  Denied any chest pain or shortness of breath.  Some hypoxia treated for CAP/ COPD exacerbation in ER with magnesium, solumedrol, nebs, vancomycin, zosyn, and 1 L bolus with CXR showing possible right sided pneumonia.  He was about to admitted and then found unresponsive and in asystole.  ACLS measures performed.  During arrest, patient developed Vtach and defibrillated 3 times with ROSC after 20 mins.  During CPR, staff stated right side of chest developed subcutaneous air then becoming diffuse.  CXR without obvious pneumothorax.  Required levophed for ongoing hypotension.  Neurologically, remained unresponsive, some movement in upper arms to noxious stimuli.  PCCM called for admit. Pt was cooled and began to spontaneously awake on 9/10.    Past Medical History  COPD on home O2/ steroid dependent, HFpEF, Afib on Xarelto, HTN, HLD, pulmonary hypertension, enlarged prostate, anemia of chronic disease     Significant Hospital Events   9/8 Admit / cardiac arrest 9/9 Off pressors 9/10 Following commands  Consults:  CT surgery  Procedures:  9/8 ETT >> 9/8 foley >> 9/8 OGT >> 9/9 L femoral central line>>  Significant Diagnostic Tests:  9/8 [facility] >> No acute intracranial pathology,  Mild age-related atrophy and chronic microvascular ischemic changes. 9/9 CT chest  >>moderate R hydropneumonothorax, extensive subcutaneous emphysema, possible tear in anterior costochondral junction, patchy RLL opacity  9/9 EEG>>continuous generalized slowing, no epileptiform changes 9/9 bilat LE venous doppler>>no evidence DVT  Micro Data:  9/8 COVID >> neg 9/8 BCx 2 >> 9/8 trach asp >>  Antimicrobials:  9/8 vanc>>9/10 9/8 Cefepime>>  Interim history/subjective:  Awake Tolerating pressure support ventilation currently without any evidence of distress He had some mild agitation overnight, received some sedating medication and needed to be started on low-dose phenylephrine, currently weaning  Objective   Blood pressure 112/70, pulse 98, temperature 98.8 F (37.1 C), temperature source Bladder, resp. rate 10, height 6' (1.829 m), weight 73.5 kg, SpO2 99 %.    Vent Mode: CPAP;PSV FiO2 (%):  [40 %-50 %] 40 % Set Rate:  [12 bmp-22 bmp] 12 bmp Vt Set:  [460 mL] 460 mL PEEP:  [5 cmH20] 5 cmH20 Pressure Support:  [5 cmH20-10 cmH20] 5 cmH20 Plateau Pressure:  [9 cmH20-12 cmH20] 12 cmH20   Intake/Output Summary (Last 24 hours) at 08/03/2019 0928 Last data filed at 08/03/2019 0900 Gross per 24 hour  Intake 2935.16 ml  Output 1730 ml  Net 1205.16 ml   Filed Weights   07/28/2019 1721 08/01/19 0523  08/01/19 1400  Weight: 72.1 kg 73.9 kg 73.5 kg    General: Ill-appearing elderly man, in no distress HEENT: ET tube in place, poor dentition, oropharynx otherwise clear Neuro: Opens eyes to voice, nods to questions, follows commands, moderately strong cough.  Nonfocal CV: Distant, regular, no murmur  heard PULM: Distant, clear bilaterally.  He has a right chest tube in place with a large continuous air leak GI: Soft, nondistended, hypoactive bowel sounds Extremities: Persistent 1+ lower extremity edema Skin: Subcutaneous air in his upper chest   Resolved Hospital Problem list   Cardiogenic shock  Assessment & Plan:   Asystolic Cardiac Arrest with subsequent Vtach probable cardiogenic shock  -Pt came into the ED awake and mildly hypoxic and was being treated for COPD exacerbation, at some point coded, Per hospitalist note walked into find patient unresponsive and pulseless, unknown down-time -ROSC after 20 minutes of CPR and defibrillation -unclear etiology for arrest, possibly ruptured bleb leading to PTX vs PE -EKG without STEMI -CT head negative, CT chest (no contrast)  P: -We will try to repeat his echocardiogram once subcutaneous air has improved -Continue empiric heparin.  No strong evidence currently to support pulmonary embolism but his outpatient syncope and his in-house collapse remain unexplained.  Note he had been temporarily off of his Eliquis, ran out of it  Acute hypoxic respiratory failure with R-sided pneumothorax and infiltrate -Underlying COPD on baseline 3L  -R-sided infiltrate on CT -no wheezing on exam P: -Tolerating spontaneous breathing trial today.  I think we should push for extubation if well-tolerated -Right chest tube to suction.  Suspect that his air leak is not going to improve until he is off positive pressure -Follow chest x-ray -Continue empiric antibiotics for suspected pneumonia versus pulmonary contusion  Acute encephalopathy following cardiac arrest -Spoke with son who reports at baseline, pt has mild memory issues, but lives independently with wife and completes ADL's, baseline oriented -CT head without acute findings -pt responsive this morning, EEG with diffuse slowing P -minimize sedation, weaning fentanyl -Prognosis is guarded,  continue to monitor mental status and neurologic recovery  Shock.  Suspect largely due to sedating medications based on history.  Consider septic or cardiogenic. P -Careful with sedating medications -Plan wean phenylephrine to off 9/11 -Check lactic acid if pressor needs continue   History of Atrial fibrillation P:  -Follow telemetry, currently may be in sinus rhythm -Continue heparin -Goal Mag > 2, K > 4 -Home diltiazem on hold for now  Hx HTN, HFpEF P:  -We will retry his echocardiogram once subcutaneous air is improved -Continue home metoprolol  Extensive subcutaneous air and UE skin weeping -bandages to arms, poor peripheral IV access P: -Continue wound care  Best practice:  Diet: NPO Pain/Anxiety/Delirium protocol (if indicated): prn fentanyl /versed VAP protocol (if indicated): yes DVT prophylaxis: heparin SQ GI prophylaxis: PPI Glucose control: SSI Mobility: BR Code Status: Full code Family Communication: Discussed in detail with patient's son at bedside on 9/10 plan to update further today.  In particular need to discuss goals of care with regard to reintubation should patient fail to tolerate extubation Disposition:  ICU   Labs   CBC: Recent Labs  Lab 08/05/2019 1726 08/05/2019 1755 08/03/2019 2344 08/01/19 0452 08/02/19 0432 08/03/19 0303  WBC 4.8  --   --  19.8* 12.9* 14.0*  NEUTROABS 3.8  --   --   --   --   --   HGB 9.0* 10.5* 10.5* 10.2* 9.1* 7.9*  HCT 32.2*  31.0* 31.0* 34.6* 30.5* 27.4*  MCV 101.9*  --   --  98.9 97.4 100.0  PLT 241  --   --  221 201 0000000    Basic Metabolic Panel: Recent Labs  Lab 08/01/19 0452 08/01/19 2349 08/02/19 0432 08/02/19 0804 08/02/19 2052 08/03/19 0303 08/03/19 0700  NA  --  145 146* 148* 145  --  147*  K  --  4.1 4.2 4.1 3.8  --  3.9  CL  --  102 102 103 104  --  107  CO2  --  34* 35* 34* 36*  --  34*  GLUCOSE  --  97 96 99 121*  --  121*  BUN  --  37* 40* 42* 43*  --  46*  CREATININE  --  0.87 0.94 0.97 0.79   --  0.70  CALCIUM  --  7.4* 7.5* 7.5* 7.5*  --  7.7*  MG 2.3  --  2.3  --   --  2.3  --   PHOS 4.1  --   --   --   --   --   --    GFR: Estimated Creatinine Clearance: 74 mL/min (by C-G formula based on SCr of 0.7 mg/dL). Recent Labs  Lab 08/22/2019 1726 08/07/2019 1735 08/01/19 0450 08/01/19 0452 08/02/19 0432 08/03/19 0303  PROCALCITON  --   --   --  0.52 0.65  --   WBC 4.8  --   --  19.8* 12.9* 14.0*  LATICACIDVEN  --  1.8 1.6  --   --   --     Liver Function Tests: Recent Labs  Lab 08/02/19 0432  AST 95*  ALT 69*  ALKPHOS 84  BILITOT 1.0  PROT 4.8*  ALBUMIN 1.9*   No results for input(s): LIPASE, AMYLASE in the last 168 hours. No results for input(s): AMMONIA in the last 168 hours.  ABG    Component Value Date/Time   PHART 7.412 08/03/2019 2344   PCO2ART 65.1 (HH) 08/01/2019 2344   PO2ART 80.0 (L) 08/15/2019 2344   HCO3 41.7 (H) 08/08/2019 2344   TCO2 44 (H) 08/10/2019 2344   O2SAT 96.0 08/10/2019 2344     Coagulation Profile: Recent Labs  Lab 08/01/19 0429  INR 1.4*    Cardiac Enzymes: No results for input(s): CKTOTAL, CKMB, CKMBINDEX, TROPONINI in the last 168 hours.  HbA1C: Hgb A1c MFr Bld  Date/Time Value Ref Range Status  06/30/2019 01:31 PM 5.5 4.8 - 5.6 % Final    Comment:    (NOTE) Pre diabetes:          5.7%-6.4% Diabetes:              >6.4% Glycemic control for   <7.0% adults with diabetes   02/18/2017 05:01 AM 5.8 (H) 4.8 - 5.6 % Final    Comment:    (NOTE)         Pre-diabetes: 5.7 - 6.4         Diabetes: >6.4         Glycemic control for adults with diabetes: <7.0     CBG: Recent Labs  Lab 08/02/19 1602 08/02/19 2014 08/03/19 0020 08/03/19 0418 08/03/19 0746  GLUCAP 106* 99 114* 112* 102*     CRITICAL CARE Performed by: Collene Gobble   Total critical care time: 35 minutes  Baltazar Apo, MD, PhD 08/03/2019, 9:37 AM [facility] [phone] or if no answer [phone]

## 2019-08-04 ENCOUNTER — Inpatient Hospital Stay (HOSPITAL_COMMUNITY): Payer: Medicare HMO

## 2019-08-04 DIAGNOSIS — R6521 Severe sepsis with septic shock: Secondary | ICD-10-CM

## 2019-08-04 DIAGNOSIS — A419 Sepsis, unspecified organism: Secondary | ICD-10-CM

## 2019-08-04 LAB — CBC
HCT: 19.4 % — ABNORMAL LOW (ref 39.0–52.0)
HCT: 19.3 % — ABNORMAL LOW (ref 39.0–52.0)
Hemoglobin: 5.6 g/dL — CL (ref 13.0–17.0)
Hemoglobin: 6.1 g/dL — CL (ref 13.0–17.0)
MCH: 29.6 pg (ref 26.0–34.0)
MCH: 30.7 pg (ref 26.0–34.0)
MCHC: 28.9 g/dL — ABNORMAL LOW (ref 30.0–36.0)
MCHC: 31.6 g/dL (ref 30.0–36.0)
MCV: 102.6 fL — ABNORMAL HIGH (ref 80.0–100.0)
MCV: 97 fL (ref 80.0–100.0)
Platelets: 147 10*3/uL — ABNORMAL LOW (ref 150–400)
Platelets: DECREASED 10*3/uL (ref 150–400)
RBC: 1.89 MIL/uL — ABNORMAL LOW (ref 4.22–5.81)
RBC: 1.99 MIL/uL — ABNORMAL LOW (ref 4.22–5.81)
RDW: 16 % — ABNORMAL HIGH (ref 11.5–15.5)
RDW: 18.6 % — ABNORMAL HIGH (ref 11.5–15.5)
WBC: 11.6 10*3/uL — ABNORMAL HIGH (ref 4.0–10.5)
WBC: 33.3 10*3/uL — ABNORMAL HIGH (ref 4.0–10.5)
nRBC: 0 % (ref 0.0–0.2)
nRBC: 0.2 % (ref 0.0–0.2)

## 2019-08-04 LAB — POCT I-STAT 7, (LYTES, BLD GAS, ICA,H+H)
Acid-Base Excess: 12 mmol/L — ABNORMAL HIGH (ref 0.0–2.0)
Acid-Base Excess: 8 mmol/L — ABNORMAL HIGH (ref 0.0–2.0)
Bicarbonate: 36.2 mmol/L — ABNORMAL HIGH (ref 20.0–28.0)
Bicarbonate: 38.3 mmol/L — ABNORMAL HIGH (ref 20.0–28.0)
Calcium, Ion: 1.14 mmol/L — ABNORMAL LOW (ref 1.15–1.40)
Calcium, Ion: 1.2 mmol/L (ref 1.15–1.40)
HCT: 15 % — ABNORMAL LOW (ref 39.0–52.0)
HCT: 18 % — ABNORMAL LOW (ref 39.0–52.0)
Hemoglobin: 5.1 g/dL — CL (ref 13.0–17.0)
Hemoglobin: 6.1 g/dL — CL (ref 13.0–17.0)
O2 Saturation: 91 %
O2 Saturation: 94 %
Patient temperature: 36.5
Patient temperature: 36.6
Potassium: 4.4 mmol/L (ref 3.5–5.1)
Potassium: 4.6 mmol/L (ref 3.5–5.1)
Sodium: 149 mmol/L — ABNORMAL HIGH (ref 135–145)
Sodium: 150 mmol/L — ABNORMAL HIGH (ref 135–145)
TCO2: 39 mmol/L — ABNORMAL HIGH (ref 22–32)
TCO2: 40 mmol/L — ABNORMAL HIGH (ref 22–32)
pCO2 arterial: 71.4 mmHg (ref 32.0–48.0)
pCO2 arterial: 86.7 mmHg (ref 32.0–48.0)
pH, Arterial: 7.226 — ABNORMAL LOW (ref 7.350–7.450)
pH, Arterial: 7.335 — ABNORMAL LOW (ref 7.350–7.450)
pO2, Arterial: 76 mmHg — ABNORMAL LOW (ref 83.0–108.0)
pO2, Arterial: 77 mmHg — ABNORMAL LOW (ref 83.0–108.0)

## 2019-08-04 LAB — MAGNESIUM: Magnesium: 2.5 mg/dL — ABNORMAL HIGH (ref 1.7–2.4)

## 2019-08-04 LAB — BASIC METABOLIC PANEL
Anion gap: 5 (ref 5–15)
BUN: 51 mg/dL — ABNORMAL HIGH (ref 8–23)
CO2: 35 mmol/L — ABNORMAL HIGH (ref 22–32)
Calcium: 7.4 mg/dL — ABNORMAL LOW (ref 8.9–10.3)
Chloride: 110 mmol/L (ref 98–111)
Creatinine, Ser: 0.91 mg/dL (ref 0.61–1.24)
GFR calc Af Amer: >60 mL/min (ref >60)
GFR calc non Af Amer: >60 mL/min (ref >60)
Glucose, Bld: 89 mg/dL (ref 70–99)
Potassium: 5.1 mmol/L (ref 3.5–5.1)
Sodium: 150 mmol/L — ABNORMAL HIGH (ref 135–145)

## 2019-08-04 LAB — PROTIME-INR
INR: 1.7 — ABNORMAL HIGH (ref 0.8–1.2)
Prothrombin Time: 20.1 seconds — ABNORMAL HIGH (ref 11.4–15.2)

## 2019-08-04 LAB — GLUCOSE, CAPILLARY
Glucose-Capillary: 117 mg/dL — ABNORMAL HIGH (ref 70–99)
Glucose-Capillary: 121 mg/dL — ABNORMAL HIGH (ref 70–99)
Glucose-Capillary: 121 mg/dL — ABNORMAL HIGH (ref 70–99)
Glucose-Capillary: 130 mg/dL — ABNORMAL HIGH (ref 70–99)
Glucose-Capillary: 53 mg/dL — ABNORMAL LOW (ref 70–99)
Glucose-Capillary: 63 mg/dL — ABNORMAL LOW (ref 70–99)
Glucose-Capillary: 83 mg/dL (ref 70–99)
Glucose-Capillary: 94 mg/dL (ref 70–99)
Glucose-Capillary: 95 mg/dL (ref 70–99)

## 2019-08-04 LAB — PROCALCITONIN: Procalcitonin: 0.24 ng/mL

## 2019-08-04 LAB — LACTIC ACID, PLASMA
Lactic Acid, Venous: 5.2 mmol/L (ref 0.5–1.9)
Lactic Acid, Venous: 3.9 mmol/L (ref 0.5–1.9)
Lactic Acid, Venous: 3.2 mmol/L (ref 0.5–1.9)
Lactic Acid, Venous: 4.1 mmol/L (ref 0.5–1.9)
Lactic Acid, Venous: 4.4 mmol/L (ref 0.5–1.9)

## 2019-08-04 LAB — ABO/RH: ABO/RH(D): A POS

## 2019-08-04 LAB — HEMOGLOBIN AND HEMATOCRIT, BLOOD
HCT: 26.8 % — ABNORMAL LOW (ref 39.0–52.0)
Hemoglobin: 8.1 g/dL — ABNORMAL LOW (ref 13.0–17.0)

## 2019-08-04 LAB — POCT ACTIVATED CLOTTING TIME: Activated Clotting Time: 0 seconds

## 2019-08-04 LAB — PREPARE RBC (CROSSMATCH)

## 2019-08-04 LAB — HEPARIN LEVEL (UNFRACTIONATED): Heparin Unfractionated: 0.54 IU/mL (ref 0.30–0.70)

## 2019-08-04 LAB — BLOOD PRODUCT ORDER (VERBAL) VERIFICATION

## 2019-08-04 MED ORDER — FENTANYL CITRATE (PF) 100 MCG/2ML IJ SOLN
25.0000 ug | INTRAMUSCULAR | Status: DC | PRN
  Administered 2019-08-04: 01:00:00 25 ug via INTRAVENOUS
  Filled 2019-08-04: qty 2

## 2019-08-04 MED ORDER — SODIUM BICARBONATE 8.4 % IV SOLN
INTRAVENOUS | Status: AC
  Administered 2019-08-04: 100 meq via INTRAVENOUS
  Filled 2019-08-04: qty 100

## 2019-08-04 MED ORDER — VASOPRESSIN 20 UNIT/ML IV SOLN
0.0300 [IU]/min | INTRAVENOUS | Status: DC
  Administered 2019-08-04 – 2019-08-06 (×3): 0.03 [IU]/min via INTRAVENOUS
  Filled 2019-08-04 (×3): qty 2

## 2019-08-04 MED ORDER — FREE WATER
200.0000 mL | Freq: Four times a day (QID) | Status: DC
  Administered 2019-08-04 – 2019-08-06 (×8): 200 mL

## 2019-08-04 MED ORDER — ORAL CARE MOUTH RINSE
15.0000 mL | OROMUCOSAL | Status: DC
  Administered 2019-08-04 – 2019-08-06 (×22): 15 mL via OROMUCOSAL

## 2019-08-04 MED ORDER — SODIUM BICARBONATE 8.4 % IV SOLN
100.0000 meq | Freq: Once | INTRAVENOUS | Status: AC
  Administered 2019-08-04: 100 meq via INTRAVENOUS

## 2019-08-04 MED ORDER — LIDOCAINE HCL (PF) 1 % IJ SOLN
INTRAMUSCULAR | Status: AC
  Filled 2019-08-04: qty 5

## 2019-08-04 MED ORDER — DEXTROSE-NACL 5-0.45 % IV SOLN
INTRAVENOUS | Status: DC
  Administered 2019-08-04 – 2019-08-06 (×9): via INTRAVENOUS

## 2019-08-04 MED ORDER — CHLORHEXIDINE GLUCONATE 0.12% ORAL RINSE (MEDLINE KIT)
15.0000 mL | Freq: Two times a day (BID) | OROMUCOSAL | Status: DC
  Administered 2019-08-04 – 2019-08-06 (×4): 15 mL via OROMUCOSAL

## 2019-08-04 MED ORDER — SODIUM CHLORIDE 0.9% IV SOLUTION
Freq: Once | INTRAVENOUS | Status: AC
  Administered 2019-08-04: 03:00:00 via INTRAVENOUS

## 2019-08-04 MED ORDER — MIDAZOLAM HCL 2 MG/2ML IJ SOLN
INTRAMUSCULAR | Status: AC
  Filled 2019-08-04: qty 2

## 2019-08-04 MED ORDER — ALBUMIN HUMAN 25 % IV SOLN
25.0000 g | Freq: Once | INTRAVENOUS | Status: AC
  Administered 2019-08-04: 25 g via INTRAVENOUS
  Filled 2019-08-04: qty 50

## 2019-08-04 MED ORDER — HYDROCORTISONE NA SUCCINATE PF 100 MG IJ SOLR
50.0000 mg | Freq: Four times a day (QID) | INTRAMUSCULAR | Status: DC
  Administered 2019-08-04 – 2019-08-06 (×9): 50 mg via INTRAVENOUS
  Filled 2019-08-04 (×9): qty 2

## 2019-08-04 MED ORDER — FENTANYL CITRATE (PF) 100 MCG/2ML IJ SOLN
INTRAMUSCULAR | Status: AC
  Filled 2019-08-04: qty 2

## 2019-08-04 MED ORDER — DEXTROSE 50 % IV SOLN
12.5000 g | Freq: Once | INTRAVENOUS | Status: AC
  Administered 2019-08-04: 12.5 g via INTRAVENOUS

## 2019-08-04 MED ORDER — ALBUMIN HUMAN 5 % IV SOLN
INTRAVENOUS | Status: AC
  Filled 2019-08-04: qty 250

## 2019-08-04 MED ORDER — FENTANYL CITRATE (PF) 100 MCG/2ML IJ SOLN
25.0000 ug | INTRAMUSCULAR | Status: DC | PRN
  Administered 2019-08-04: 50 ug via INTRAVENOUS
  Filled 2019-08-04: qty 2

## 2019-08-04 MED ORDER — SODIUM CHLORIDE 0.9% IV SOLUTION
Freq: Once | INTRAVENOUS | Status: AC
  Administered 2019-08-04: 21:00:00 via INTRAVENOUS

## 2019-08-04 MED ORDER — DEXTROSE 50 % IV SOLN
INTRAVENOUS | Status: AC
  Filled 2019-08-04: qty 50

## 2019-08-04 NOTE — Progress Notes (Signed)
CRITICAL VALUE ALERT  Critical Value: Hemoglobin 5.6  Date & Time Notied: 08/04/19 0206  Provider Notified: Jennelle Human CCM   Orders Received/Actions taken: See new orders to transfuse RBC's.   RN will continue to monitor pt closely.

## 2019-08-04 NOTE — Progress Notes (Signed)
CRITICAL VALUE ALERT  Critical Value:  Lactic Acid 5.2  Date & Time Notied:  08/04/19 [id number]  Provider Notified: Dr. Cyruss Ivanoff [facility])  Orders Received/Actions taken: No new orders at this time.   RN will continue to monitor pt closely.

## 2019-08-04 NOTE — Progress Notes (Addendum)
PCCM Interval Note   Called to bedside by Elink r/t worsening hypotension, now on neo at 400 mcg and levophed at 9 mcg/min and assessment for re-intubation  Prior ABG 1832- 7.271/ 81.8/ 82/ 37.8  Current ABG 7.221/ 88.2/ 78/ 36.2 on BiPAP   Patient on BiPAP since 1700, 16/2, 50% getting around 400-700 TVs  On exam, patient will arouse, follow some commands, but remains very weak, diminished faint crackles throughout; ongoing right sided CT to 20 cm sxn- w/ cont air leak  He is a no CPR/ defib but family is ok with reintubation per day notes and per discussions with family by Dr. Cleaven Ivanoff in [place].    Labs/ chart reviewed.  Hypotension seems to driven by worsening/ ongoing respiratory acidosis.   P:  2 amps bicarb now  Intubation by Dr. Gilford Raid (CXR deferred given intubated by bronch with carina visualized given hemodynamic instability) Full MV support PRVC 6 cc/kg, rate 18, no peep for now, FiO2 50% ABG in 1 hour PAD protocol with prn fentanyl Ongoing levophed and neo for MAP goal >65   Wife called and updated.    Addendum: ABG reviewed.  Improving.  Still ongoing hypotension.  Hgb on ABG resulted 5.1 (noted to be trending down from prior ABGs.  Will check stat CBC, send Type/screen, and prepare 2 units PRBC- HOLD transfusion till Hgb verified.  Hold heparin gtt for now.  RN reports no major change in output from CT, UOP remains cyu.     I spent 35 minutes in direct patient care including reviewing data,  discussing with other providers, assessment, planning and stabilization and documentation. Time is exclusive to this patient and does not include procedures.    Kennieth Rad, MSN, AGACNP-BC [facility] Pgr: [phone] or if no answer [phone] 08/04/2019, 1:05 AM

## 2019-08-04 NOTE — Progress Notes (Addendum)
eLink Physician-Brief Progress Note Patient Name: Kyle Mosley DOB: 1935-05-18 MRN: [id number]   Date of Service  08/04/2019  HPI/Events of Note  Notified of hgb 6.1.  eICU Interventions  Transfuse 1 unit pRBC.     Intervention Category Minor Interventions: Other:  Elsie Lincoln 08/04/2019, 8:12 PM    9:38 PM Informed by RN that his mental status has changed compared to the night prior.  He is awake, opens his eyes to name calling but not as responsive.   Plan> Check ABG.  Hold off on imaging.  Suspect this is metabolic encephalopathy.   12:30 AM RN reported bruising in the flank, going down to the right leg.   Plan> Check CT abdomen and pelvis to evaluated for retroperitoneal hematoma.   Check CT chest as well to reevaluated pneumothorax and emphysema.  3:44 AM CT chest, abdomen and pelvis shows right hemothorax, retroperitoneal bleed.  ABG 7.2/76.3/73 on 50%.  Plan> Interventional radiology paged.  Awaiting callback.  Follow CBC for further need of blood.  We will tolerate hypercapnea in setting of pneumothorax. No changes in the vent for now.

## 2019-08-04 NOTE — Progress Notes (Signed)
Hypoglycemic Event  CBG: 56  Treatment: 12.5 g D50 IV  Symptoms: UTA   Follow-up CBG: [id number] CBG Result:83  Possible Reasons for Event: Pt no longer on  tube feeds.   Comments/MD notified:Dr. Rufino Ivanoff notified. New orders for D5 1/2 NS at 100.   RN will continue to monitor pt closely.    Nikolaus Pienta B Tashea Othman

## 2019-08-04 NOTE — Progress Notes (Signed)
NAME:  Kyle Mosley, MRN:  [id number], DOB:  Jan 09, 1935, LOS: 4 ADMISSION DATE:  08/18/2019, CHIEF COMPLAINT:  Cardiac arrest   Brief History   84 year old male presenting with syncopal episode, weakness, and lower leg swelling who decompensated in ER, found unresponsive in asystolic cardiac arrest, ROSC after 20 mins.  Developed severe diffuse subcutaneous air during CPR without obvious pneumothorax on CXR.  On vasopressor support for hypotension.  PCCM called for admit.   History of present illness   HPI obtained from medical chart review as patient is unresponsive on mechanical ventilation.    84 year old male with prior history of COPD on home O2/ steroid dependent, asthma, Afib on Xarelto, HFpEF, HTN, HLD, and pulmonary hypertension who presented from home after progressive lower extremity swelling for 3 weeks, using his inhaler more frequently but not complaining of SOB, who became weak today and collapsed.  Possible recent falls at home.    On arrival to ER, patient was alert and oriented.  Denied any chest pain or shortness of breath.  Some hypoxia treated for CAP/ COPD exacerbation in ER with magnesium, solumedrol, nebs, vancomycin, zosyn, and 1 L bolus with CXR showing possible right sided pneumonia.  He was about to admitted and then found unresponsive and in asystole.  ACLS measures performed.  During arrest, patient developed Vtach and defibrillated 3 times with ROSC after 20 mins.  During CPR, staff stated right side of chest developed subcutaneous air then becoming diffuse.  CXR without obvious pneumothorax.  Required levophed for ongoing hypotension.  Neurologically, remained unresponsive, some movement in upper arms to noxious stimuli.  PCCM called for admit. Pt was cooled and began to spontaneously awake on 9/10.    Past Medical History  COPD on home O2/ steroid dependent, HFpEF, Afib on Xarelto, HTN, HLD, pulmonary hypertension, enlarged prostate, anemia of chronic disease    Significant Hospital Events   9/8 Admit / cardiac arrest 9/9 Off pressors 9/10 Following commands  Consults:  CT surgery  Procedures:  9/8 ETT >> 9/8 foley >> 9/8 OGT >> 9/9 L femoral central line>>  Significant Diagnostic Tests:  9/8 [facility] >> No acute intracranial pathology,  Mild age-related atrophy and chronic microvascular ischemic changes. 9/9 CT chest  >>moderate R hydropneumonothorax, extensive subcutaneous emphysema, possible tear in anterior costochondral junction, patchy RLL opacity  9/9 EEG>>continuous generalized slowing, no epileptiform changes 9/9 bilat LE venous doppler>>no evidence DVT  Micro Data:  9/8 COVID >> neg 9/8 BCx 2 >> 9/8 trach asp >>  Antimicrobials:  9/8 vanc>>9/10 9/8 Cefepime>>  Interim history/subjective:  Extubated 9/11, initially successfully but then he developed significant hypercapnia.  Was placed on BiPAP and subsequently required reintubation Currently PRVC 460, RR 18, PEEP 0 Evolving shock with escalating pressor needs, phenylephrine 400, norepinephrine 36, vasopressin Noted to have acute anemia, hemoglobin as low as 5.1, received 2 units PRBC with appropriate response.  Heparin infusion was stopped Hyponatremia noted, renal function remained stable   Objective   Blood pressure 102/74, pulse (!) 139, temperature 97.9 F (36.6 C), resp. rate 18, height 6' (1.829 m), weight 73.5 kg, SpO2 95 %. CVP:  [3 mmHg-14 mmHg] 14 mmHg  Vent Mode: PRVC FiO2 (%):  [36 %-50 %] 50 % Set Rate:  [18 bmp] 18 bmp Vt Set:  [460 mL] 460 mL PEEP:  [0 cmH20-2 cmH20] 0 cmH20 Pressure Support:  [14 cmH20] 14 cmH20 Plateau Pressure:  [18 cmH20-24 cmH20] 22 cmH20   Intake/Output Summary (Last 24  hours) at 08/04/2019 0939 Last data filed at 08/04/2019 0900 Gross per 24 hour  Intake 4754.69 ml  Output 975 ml  Net 3779.69 ml   Filed Weights   08/15/2019 1721 08/01/19 0523 08/01/19 1400  Weight: 72.1 kg 73.9 kg 73.5 kg    General: Critically  ill-appearing man, ventilated HEENT: ET tube is in place, oropharynx otherwise clear, pupils equal and react Neuro: Opens eyes to voice, nodded to questions, intermittently following commands CV: Distant, regular, tachycardic, no murmur PULM: Coarse bilaterally.  Right chest tube is in place and is to suction -20, no longer has an air leak or respiratory variation.  Blood-tinged fluid in the collection container GI: Soft, nondistended, hypoactive bowel sounds Extremities: Trace lower extremity edema Skin: Stable subcutaneous air in his upper chest   Resolved Hospital Problem list   Cardiogenic shock  Assessment & Plan:   Asystolic Cardiac Arrest with subsequent Vtach probable cardiogenic shock  -Pt came into the ED awake and mildly hypoxic and was being treated for COPD exacerbation, at some point coded, Per hospitalist note walked into find patient unresponsive and pulseless, unknown down-time -ROSC after 20 minutes of CPR and defibrillation -unclear etiology for arrest, possibly ruptured bleb leading to PTX vs PE vs other -EKG without STEMI -CT head negative, CT chest (no contrast)  -Now with progressive shock, at least a component of hemorrhagic given acute drop in hemoglobin 9/11 P: -Still not repeated his echocardiogram given the subcutaneous air and difficult interpretation -Heparin discontinued given his anemia, high risk for either intrathoracic or intra-abdominal blood loss.  We do not have any strong evidence to support pulmonary embolism, his syncope, and house collapse were unexplained.  Was formally on Eliquis for A. fib -PRBC resuscitated, follow CBC for any evidence of further bleeding. -Continue empiric cefepime given the risk for pneumonia with sepsis.  Consider addition vancomycin.  Avoiding for now given the risk for renal injury   Acute hypoxic respiratory failure with R-sided pneumothorax and infiltrate -Underlying COPD on baseline 3L  -R-sided infiltrate on CT  -no wheezing on exam P: -He initially tolerated extubation, did not have profound work of breathing, but did not ventilate adequately due to his continued air leak.  Ultimately required reintubation.  Currently ventilating on PEEP 0, airleak appears to have resolved.  Question clot if he had intrapleural bleeding.  At risk for hemothorax.  Continue to ventilate with the settings.  Assess him for possible extubation again if we can stabilize him hemodynamically -Follow chest x-ray -Empiric cefepime as above for suspected pneumonia versus pulmonary contusion  Acute encephalopathy following cardiac arrest -Spoke with son who reports at baseline, pt has mild memory issues, but lives independently with wife and completes ADL's, baseline oriented -CT head without acute findings -pt responsive this morning, EEG with diffuse slowing P -minimize sedation, weaning fentanyl -Prognosis is guarded, continue to monitor mental status and neurologic recovery  Acute blood loss anemia, source unclear -Question evolving right hemothorax although chest x-ray largely unchanged -At risk retroperitoneal bleed.  Will defer CT chest, abdomen for now given his hemodynamic instability  Shock.  Most recent decompensation most consistent with acute blood loss anemia, evolving respiratory and metabolic acidosis. P -Follow CBC and support with PRBC as necessary to maintain hemoglobin greater than 8.0. -Too unstable at this time to evaluate with CT chest abdomen to look for either retroperitoneal bleed or evolving hemothorax.  There is no blood coming out of his chest tube currently, his large air leak has stopped.  Question clot or obstruction -Wean phenylephrine and norepinephrine as able, continue vasopressin -Follow lactic acid -Minimize sedating medications as able  History of Atrial fibrillation P:  -Continue to follow telemetry -Heparin discontinued given his acute blood loss -Diltiazem on hold  Hx HTN,  HFpEF P:  -We will retry his echocardiogram once subcutaneous air is improved -Metoprolol discontinued  Extensive subcutaneous air and UE skin weeping -bandages to arms, poor peripheral IV access P: -Continue wound care  Best practice:  Diet: NPO Pain/Anxiety/Delirium protocol (if indicated): prn fentanyl /versed VAP protocol (if indicated): yes DVT prophylaxis: heparin SQ GI prophylaxis: PPI Glucose control: SSI Mobility: BR Code Status: Full code Family Communication: Have discussed the status, problems, plans with sons at bedside daily.  Goals have been to continue all aggressive care to give him an opportunity to stabilize, improve.  Unfortunately he is recurrent respiratory failure, new progressive shock may represent setbacks from when she cannot recover.  We will have to broach the subject of continued support given these new findings. Disposition:  ICU   Labs   CBC: Recent Labs  Lab 08/22/2019 1726  08/01/19 0452 08/02/19 0432 08/03/19 0303 08/03/19 1832 08/04/19 0001 08/04/19 0115 08/04/19 0125 08/04/19 0500  WBC 4.8  --  19.8* 12.9* 14.0*  --   --   --  11.6*  --   NEUTROABS 3.8  --   --   --   --   --   --   --   --   --   HGB 9.0*   < > 10.2* 9.1* 7.9* 7.5* 6.1* 5.1* 5.6* 8.1*  HCT 32.2*   < > 34.6* 30.5* 27.4* 22.0* 18.0* 15.0* 19.4* 26.8*  MCV 101.9*  --  98.9 97.4 100.0  --   --   --  102.6*  --   PLT 241  --  221 201 195  --   --   --  147*  --    < > = values in this interval not displayed.    Basic Metabolic Panel: Recent Labs  Lab 08/01/19 0452  08/02/19 0432 08/02/19 0804 08/02/19 2052 08/03/19 0303 08/03/19 0700 08/03/19 1832 08/04/19 0001 08/04/19 0115 08/04/19 0125  NA  --    < > 146* 148* 145  --  147* 149* 149* 150* 150*  K  --    < > 4.2 4.1 3.8  --  3.9 3.9 4.4 4.6 5.1  CL  --    < > 102 103 104  --  107  --   --   --  110  CO2  --    < > 35* 34* 36*  --  34*  --   --   --  35*  GLUCOSE  --    < > 96 99 121*  --  121*  --   --   --   89  BUN  --    < > 40* 42* 43*  --  46*  --   --   --  51*  CREATININE  --    < > 0.94 0.97 0.79  --  0.70  --   --   --  0.91  CALCIUM  --    < > 7.5* 7.5* 7.5*  --  7.7*  --   --   --  7.4*  MG 2.3  --  2.3  --   --  2.3  --   --   --   --  2.5*  PHOS 4.1  --   --   --   --   --   --   --   --   --   --    < > = values in this interval not displayed.   GFR: Estimated Creatinine Clearance: 65.1 mL/min (by C-G formula based on SCr of 0.91 mg/dL). Recent Labs  Lab 08/01/19 0450 08/01/19 0452 08/02/19 0432 08/03/19 0303 08/03/19 1122 08/04/19 0125 08/04/19 0536 08/04/19 0809  PROCALCITON  --  0.52 0.65  --  0.33 0.24  --   --   WBC  --  19.8* 12.9* 14.0*  --  11.6*  --   --   LATICACIDVEN 1.6  --   --   --  1.0  --  5.2* 3.9*    Liver Function Tests: Recent Labs  Lab 08/02/19 0432  AST 95*  ALT 69*  ALKPHOS 84  BILITOT 1.0  PROT 4.8*  ALBUMIN 1.9*   No results for input(s): LIPASE, AMYLASE in the last 168 hours. No results for input(s): AMMONIA in the last 168 hours.  ABG    Component Value Date/Time   PHART 7.335 (L) 08/04/2019 0115   PCO2ART 71.4 (HH) 08/04/2019 0115   PO2ART 77.0 (L) 08/04/2019 0115   HCO3 38.3 (H) 08/04/2019 0115   TCO2 40 (H) 08/04/2019 0115   O2SAT 94.0 08/04/2019 0115     Coagulation Profile: Recent Labs  Lab 08/01/19 0429 08/04/19 0203  INR 1.4* 1.7*    Cardiac Enzymes: No results for input(s): CKTOTAL, CKMB, CKMBINDEX, TROPONINI in the last 168 hours.  HbA1C: Hgb A1c MFr Bld  Date/Time Value Ref Range Status  06/30/2019 01:31 PM 5.5 4.8 - 5.6 % Final    Comment:    (NOTE) Pre diabetes:          5.7%-6.4% Diabetes:              >6.4% Glycemic control for   <7.0% adults with diabetes   02/18/2017 05:01 AM 5.8 (H) 4.8 - 5.6 % Final    Comment:    (NOTE)         Pre-diabetes: 5.7 - 6.4         Diabetes: >6.4         Glycemic control for adults with diabetes: <7.0     CBG: Recent Labs  Lab 08/04/19 0057 08/04/19  0504 08/04/19 0539 08/04/19 0800 08/04/19 0813  GLUCAP 94 53* 83 63* 95     CRITICAL CARE Performed by: Collene Gobble   Total critical care time: 34 minutes  Baltazar Apo, MD, PhD 08/04/2019, 9:39 AM [facility] [phone] or if no answer [phone]

## 2019-08-04 NOTE — Procedures (Addendum)
Intubation Procedure Note Kyle Mosley [id number] 15-Jan-1935  Procedure: Intubation Indications: Respiratory insufficiency Intubated for hypercarbia   Procedure Details Consent: Unable to obtain consent because of altered level of consciousness. Time Out: Verified patient identification, verified procedure, site/side was marked, verified correct patient position, special equipment/implants available, medications/allergies/relevent history reviewed, required imaging and test results available.  Performed  Maximum sterile technique was used including antiseptics, gloves, hand hygiene and mask.  Awake intubation with Bronchoscopy ETT 7.5 at 26 lip No RSI needed.  Evaluation Hemodynamic Status: Transient hypotension treated with pressors and fluid; O2 sats: stable throughout Patient's Current Condition: stable Complications: No apparent complications Patient did tolerate procedure well. Chest X-ray ordered to verify placement.  CXR not needed. Carina seen on bronchoscopy and position confirmed with direct visualization     Kyle Mosley 08/04/2019

## 2019-08-04 NOTE — Progress Notes (Signed)
Subjective:  Back on vent.  Objective: Vital signs in last 24 hours: Temp:  [95.2 F (35.1 C)-99 F (37.2 C)] 98.4 F (36.9 C) (09/12 1130) Pulse Rate:  [48-167] 48 (09/12 1130) Cardiac Rhythm: Sinus tachycardia (09/12 0800) Resp:  [13-36] 26 (09/12 1130) BP: (70-128)/(35-82) 118/63 (09/12 1100) SpO2:  [87 %-100 %] 89 % (09/12 1130) Arterial Line BP: (39-115)/(26-68) 87/54 (09/12 1130) FiO2 (%):  [40 %-50 %] 50 % (09/12 0800)  Hemodynamic parameters for last 24 hours: CVP:  [3 mmHg-14 mmHg] 14 mmHg  Intake/Output from previous day: 09/11 0701 - 09/12 0700 In: 4167.1 [I.V.:3361.3; Blood:315; NG/GT:110.3; IV Piggyback:380.5] Out: 1155 [Urine:615; Chest Tube:540] Intake/Output this shift: Total I/O In: 1694 [I.V.:1594; IV Piggyback:100] Out: 65 [Urine:45; Chest Tube:20]  General appearance: intubated Lungs: distant BS throughout subcutaneous emphysema over right chest stable compared to last night when I saw him. No air leak this am. Chest tube checked and not kinked  Lab Results: Recent Labs    08/03/19 0303  08/04/19 0125 08/04/19 0500  WBC 14.0*  --  11.6*  --   HGB 7.9*   < > 5.6* 8.1*  HCT 27.4*   < > 19.4* 26.8*  PLT 195  --  147*  --    < > = values in this interval not displayed.   BMET:  Recent Labs    08/03/19 0700  08/04/19 0115 08/04/19 0125  NA 147*   < > 150* 150*  K 3.9   < > 4.6 5.1  CL 107  --   --  110  CO2 34*  --   --  35*  GLUCOSE 121*  --   --  89  BUN 46*  --   --  51*  CREATININE 0.70  --   --  0.91  CALCIUM 7.7*  --   --  7.4*   < > = values in this interval not displayed.    PT/INR:  Recent Labs    08/04/19 0203  LABPROT 20.1*  INR 1.7*   ABG    Component Value Date/Time   PHART 7.335 (L) 08/04/2019 0115   HCO3 38.3 (H) 08/04/2019 0115   TCO2 40 (H) 08/04/2019 0115   O2SAT 94.0 08/04/2019 0115   CBG (last 3)  Recent Labs    08/04/19 0800 08/04/19 0813 08/04/19 1149  GLUCAP 63* 95 121*   CLINICAL DATA:   Evaluate pneumothorax.  EXAM: PORTABLE CHEST 1 VIEW  COMPARISON:  08/03/2019 and chest CT 08/01/2019 and CT 11/28/2017  FINDINGS: Patient slightly rotated to the right. Endotracheal tube has tip 3.6 cm above the carina. Right-sided chest tube unchanged. Stable moderate right apical emphysematous bulla without definitive pneumothorax identified. Persistent right lung opacification without significant change which may represent combination of effusion and atelectasis, although infection is possible. Stable mild linear left base density likely atelectasis. Remainder of the exam is unchanged including multiple right rib fractures and diffuse subcutaneous emphysema.  IMPRESSION: Stable hazy opacification over the right base likely combination of effusion with atelectasis. Infection is possible. Moderate stable right apical emphysematous bulla without definitive pneumothorax.  Mild stable left base opacification likely atelectasis.  Tubes and lines as described.   Electronically Signed   By: Marin Olp M.D.   On: 08/04/2019 08:36  Assessment/Plan:  The chest tube is in good position with no ptx on CXR. The air leak has resolved for now. Would continue chest tube to 20 cm suction today and observe. Repeat CXR in am.  LOS: 4 days    Gaye Pollack 08/04/2019

## 2019-08-04 NOTE — Progress Notes (Signed)
[place] notified of critical Lactic acid at 4.4 and Hgb 6.1. Awaiting orders

## 2019-08-05 ENCOUNTER — Inpatient Hospital Stay (HOSPITAL_COMMUNITY): Payer: Medicare HMO

## 2019-08-05 DIAGNOSIS — L8990 Pressure ulcer of unspecified site, unspecified stage: Secondary | ICD-10-CM | POA: Insufficient documentation

## 2019-08-05 LAB — CBC
HCT: 22.5 % — ABNORMAL LOW (ref 39.0–52.0)
Hemoglobin: 7 g/dL — ABNORMAL LOW (ref 13.0–17.0)
MCH: 30 pg (ref 26.0–34.0)
MCHC: 31.1 g/dL (ref 30.0–36.0)
MCV: 96.6 fL (ref 80.0–100.0)
Platelets: 95 10*3/uL — ABNORMAL LOW (ref 150–400)
RBC: 2.33 MIL/uL — ABNORMAL LOW (ref 4.22–5.81)
RDW: 17.2 % — ABNORMAL HIGH (ref 11.5–15.5)
WBC: 30.3 10*3/uL — ABNORMAL HIGH (ref 4.0–10.5)
nRBC: 0.6 % — ABNORMAL HIGH (ref 0.0–0.2)

## 2019-08-05 LAB — POCT I-STAT 7, (LYTES, BLD GAS, ICA,H+H)
Acid-Base Excess: 2 mmol/L (ref 0.0–2.0)
Acid-base deficit: 2 mmol/L (ref 0.0–2.0)
Acid-base deficit: 7 mmol/L — ABNORMAL HIGH (ref 0.0–2.0)
Bicarbonate: 30.4 mmol/L — ABNORMAL HIGH (ref 20.0–28.0)
Bicarbonate: 25.8 mmol/L (ref 20.0–28.0)
Bicarbonate: 21 mmol/L (ref 20.0–28.0)
Calcium, Ion: 1.14 mmol/L — ABNORMAL LOW (ref 1.15–1.40)
Calcium, Ion: 1.11 mmol/L — ABNORMAL LOW (ref 1.15–1.40)
Calcium, Ion: 1.03 mmol/L — ABNORMAL LOW (ref 1.15–1.40)
HCT: 19 % — ABNORMAL LOW (ref 39.0–52.0)
HCT: 21 % — ABNORMAL LOW (ref 39.0–52.0)
HCT: 21 % — ABNORMAL LOW (ref 39.0–52.0)
Hemoglobin: 6.5 g/dL — CL (ref 13.0–17.0)
Hemoglobin: 7.1 g/dL — ABNORMAL LOW (ref 13.0–17.0)
Hemoglobin: 7.1 g/dL — ABNORMAL LOW (ref 13.0–17.0)
O2 Saturation: 90 %
O2 Saturation: 90 %
O2 Saturation: 91 %
Patient temperature: 36.2
Patient temperature: 36.21
Potassium: 4.4 mmol/L (ref 3.5–5.1)
Potassium: 4.5 mmol/L (ref 3.5–5.1)
Potassium: 3.9 mmol/L (ref 3.5–5.1)
Sodium: 148 mmol/L — ABNORMAL HIGH (ref 135–145)
Sodium: 147 mmol/L — ABNORMAL HIGH (ref 135–145)
Sodium: 149 mmol/L — ABNORMAL HIGH (ref 135–145)
TCO2: 33 mmol/L — ABNORMAL HIGH (ref 22–32)
TCO2: 28 mmol/L (ref 22–32)
TCO2: 23 mmol/L (ref 22–32)
pCO2 arterial: 73.7 mmHg (ref 32.0–48.0)
pCO2 arterial: 67.2 mmHg (ref 32.0–48.0)
pCO2 arterial: 54.9 mmHg — ABNORMAL HIGH (ref 32.0–48.0)
pH, Arterial: 7.219 — ABNORMAL LOW (ref 7.350–7.450)
pH, Arterial: 7.192 — CL (ref 7.350–7.450)
pH, Arterial: 7.187 — CL (ref 7.350–7.450)
pO2, Arterial: 69 mmHg — ABNORMAL LOW (ref 83.0–108.0)
pO2, Arterial: 75 mmHg — ABNORMAL LOW (ref 83.0–108.0)
pO2, Arterial: 74 mmHg — ABNORMAL LOW (ref 83.0–108.0)

## 2019-08-05 LAB — GLUCOSE, CAPILLARY
Glucose-Capillary: 135 mg/dL — ABNORMAL HIGH (ref 70–99)
Glucose-Capillary: 127 mg/dL — ABNORMAL HIGH (ref 70–99)
Glucose-Capillary: 129 mg/dL — ABNORMAL HIGH (ref 70–99)
Glucose-Capillary: 115 mg/dL — ABNORMAL HIGH (ref 70–99)
Glucose-Capillary: 109 mg/dL — ABNORMAL HIGH (ref 70–99)
Glucose-Capillary: 91 mg/dL (ref 70–99)

## 2019-08-05 LAB — CULTURE, BLOOD (ROUTINE X 2)
Culture: NO GROWTH
Culture: NO GROWTH
Special Requests: ADEQUATE
Special Requests: ADEQUATE

## 2019-08-05 LAB — HEMOGLOBIN AND HEMATOCRIT, BLOOD
HCT: 24.5 % — ABNORMAL LOW (ref 39.0–52.0)
HCT: 25 % — ABNORMAL LOW (ref 39.0–52.0)
Hemoglobin: 7.6 g/dL — ABNORMAL LOW (ref 13.0–17.0)
Hemoglobin: 7.7 g/dL — ABNORMAL LOW (ref 13.0–17.0)

## 2019-08-05 LAB — BASIC METABOLIC PANEL
Anion gap: 6 (ref 5–15)
BUN: 59 mg/dL — ABNORMAL HIGH (ref 8–23)
CO2: 28 mmol/L (ref 22–32)
Calcium: 6.7 mg/dL — ABNORMAL LOW (ref 8.9–10.3)
Chloride: 114 mmol/L — ABNORMAL HIGH (ref 98–111)
Creatinine, Ser: 1.42 mg/dL — ABNORMAL HIGH (ref 0.61–1.24)
GFR calc Af Amer: 53 mL/min — ABNORMAL LOW (ref >60)
GFR calc non Af Amer: 46 mL/min — ABNORMAL LOW (ref >60)
Glucose, Bld: 145 mg/dL — ABNORMAL HIGH (ref 70–99)
Potassium: 4.6 mmol/L (ref 3.5–5.1)
Sodium: 148 mmol/L — ABNORMAL HIGH (ref 135–145)

## 2019-08-05 LAB — PREPARE RBC (CROSSMATCH)

## 2019-08-05 LAB — LACTIC ACID, PLASMA: Lactic Acid, Venous: 3.4 mmol/L (ref 0.5–1.9)

## 2019-08-05 LAB — PROCALCITONIN: Procalcitonin: 4.72 ng/mL

## 2019-08-05 LAB — PROTIME-INR
INR: 3.9 — ABNORMAL HIGH (ref 0.8–1.2)
Prothrombin Time: 37.9 seconds — ABNORMAL HIGH (ref 11.4–15.2)

## 2019-08-05 MED ORDER — SODIUM CHLORIDE 0.9 % IV SOLN
0.0000 ug/min | INTRAVENOUS | Status: DC
  Administered 2019-08-05 (×4): 400 ug/min via INTRAVENOUS
  Administered 2019-08-05: 410 ug/min via INTRAVENOUS
  Administered 2019-08-05 – 2019-08-06 (×2): 400 ug/min via INTRAVENOUS
  Administered 2019-08-06: 53.333 ug/min via INTRAVENOUS
  Administered 2019-08-06: 400 ug/min via INTRAVENOUS
  Filled 2019-08-05 (×8): qty 8
  Filled 2019-08-05: qty 5
  Filled 2019-08-05: qty 8

## 2019-08-05 MED ORDER — SODIUM CHLORIDE 0.9% IV SOLUTION
Freq: Once | INTRAVENOUS | Status: AC
  Administered 2019-08-05: 05:00:00 via INTRAVENOUS

## 2019-08-05 MED ORDER — PHENYLEPHRINE HCL-NACL 40-0.9 MG/250ML-% IV SOLN: 0.0000 ug/min | INTRAVENOUS | Status: DC

## 2019-08-05 MED ORDER — SODIUM CHLORIDE 0.9% IV SOLUTION
Freq: Once | INTRAVENOUS | Status: AC
  Administered 2019-08-05: 20:00:00 via INTRAVENOUS

## 2019-08-05 MED ORDER — SODIUM CHLORIDE 0.9% IV SOLUTION
Freq: Once | INTRAVENOUS | Status: AC
  Administered 2019-08-05: 22:00:00 via INTRAVENOUS

## 2019-08-05 NOTE — Progress Notes (Signed)
RT and RN transported pt from [place] to CT and back without complication. RT will continue to monitor.

## 2019-08-05 NOTE — Progress Notes (Signed)
eLink Physician-Brief Progress Note Patient Name: Kyle Mosley DOB: 02/05/1935 MRN: [id number]   Date of Service  08/05/2019  HPI/Events of Note  Patient intermittently follows commands - Blood glucose = 91.  Last ABG = 7.192/97.2/75.0. Head CT Scan last done 08/08/2019 was negative for intracranial pathology.   eICU Interventions       Intervention Category Major Interventions: Change in mental status - evaluation and management  Samwise Eckardt [place] 08/05/2019, 10:51 PM

## 2019-08-05 NOTE — Progress Notes (Signed)
Subjective:  Remains on ventilator Shock requiring 400 mcg neo and 7 mcg NE, Vaso 0.03 to support BP. Was on 40 mcg NE yesterday.  Had retroperitoneal bleed and developed enlarged right hemothorax on anticoagulation with drop in Hgb to 5.6.  Objective: Vital signs in last 24 hours: Temp:  [96.8 F (36 C)-99.1 F (37.3 C)] 97 F (36.1 C) (09/13 0830) Pulse Rate:  [38-146] 101 (09/13 0830) Cardiac Rhythm: Sinus tachycardia;Atrial fibrillation (09/13 0400) Resp:  [14-36] 14 (09/13 0830) BP: (54-140)/(27-116) 82/52 (09/13 0830) SpO2:  [79 %-100 %] 100 % (09/13 0830) Arterial Line BP: (66-134)/(26-64) 95/47 (09/13 0830) FiO2 (%):  [50 %] 50 % (09/13 0803)  Hemodynamic parameters for last 24 hours:    Intake/Output from previous day: 09/12 0701 - 09/13 0700 In: 9600.2 [I.V.:8670.2; Blood:630; IV Piggyback:300] Out: 565 [Urine:455; Chest Tube:110] Intake/Output this shift: Total I/O In: 293.8 [I.V.:293.8] Out: 20 [Urine:20]  General appearance: intubated Heart: regular rate and rhythm Lungs: coarse chest tube has minimal output and no air leak.  Lab Results: Recent Labs    08/04/19 1812 08/05/19 0257 08/05/19 0308 08/05/19 0728  WBC 33.3* 30.3*  --   --   HGB 6.1* 7.0* 6.5* 7.6*  HCT 19.3* 22.5* 19.0* 24.5*  PLT PLATELET CLUMPS NOTED ON SMEAR, COUNT APPEARS DECREASED 95*  --   --    BMET:  Recent Labs    08/04/19 0125 08/05/19 0257 08/05/19 0308  NA 150* 148* 148*  K 5.1 4.6 4.4  CL 110 114*  --   CO2 35* 28  --   GLUCOSE 89 145*  --   BUN 51* 59*  --   CREATININE 0.91 1.42*  --   CALCIUM 7.4* 6.7*  --     PT/INR:  Recent Labs    08/04/19 0203  LABPROT 20.1*  INR 1.7*   ABG    Component Value Date/Time   PHART 7.219 (L) 08/05/2019 0308   HCO3 30.4 (H) 08/05/2019 0308   TCO2 33 (H) 08/05/2019 0308   O2SAT 90.0 08/05/2019 0308   CBG (last 3)  Recent Labs    08/04/19 2355 08/05/19 0440 08/05/19 0805  GLUCAP 130* 135* 127*   CLINICAL  DATA:  Traumatic subcutaneous emphysema.  Recent CPR.  EXAM: CT CHEST, ABDOMEN AND PELVIS WITHOUT CONTRAST  TECHNIQUE: Multidetector CT imaging of the chest, abdomen and pelvis was performed following the standard protocol without IV contrast.  COMPARISON:  Chest and abdominal radiographs yesterday. Chest CT 08/01/2019  FINDINGS: CT CHEST FINDINGS  Cardiovascular: Aortic atherosclerosis. No aneurysm. No periaortic stranding. Heart is normal in size. There are coronary artery calcifications. Small pericardial effusion about the right heart border.  Mediastinum/Nodes: Endotracheal and enteric tubes in place. Multiple small mediastinal lymph nodes. Limited assessment for hilar adenopathy in the absence of IV contrast. Right anterior mediastinal hematoma likely related to recent CPR measures 4.4 x 3.7 x 8.1 cm.  Lungs/Pleura: Right pigtail catheter in place with tip near the apex. Air-filled structure at the right lung apex measuring 8.7 cm cranial caudal may represent a bulla versus residual pneumothorax, pneumothorax has otherwise resolved. Moderately large right hemothorax with heterogeneous pleural fluid most prominent at the dependent lung base. Near complete consolidation of the right lower lobe with air bronchograms. Advanced emphysema. Dependent opacity at the left lung base partially obscured by motion but not significantly changed. No left pleural fluid.  Retained mucus in the distal trachea and both mainstem bronchi which partially collapsed, possible bronchomalacia.  Musculoskeletal: Persistent but  diminished diffuse subcutaneous emphysema throughout the chest wall. Heterogeneous fluid involving the right chest wall most consistent with hemorrhage. There is generalized body wall edema that has progressed from prior. Displaced right posterior first through fourth rib fractures. Anterior rib fractures of ribs 2 through 5, second and third rib fractures are  displaced. Mild adjacent displaced sternal fracture. No definite left rib fracture. Subcutaneous emphysema seen tracking in the included right greater than left upper extremity.  CT ABDOMEN PELVIS FINDINGS  Hepatobiliary: Limited in the absence of IV contrast. No obvious focal hepatic lesion. High-density material in the gallbladder.  Pancreas: No gross acute abnormality.  Spleen: Normal in size without focal abnormality.  Adrenals/Urinary Tract: No dominant adrenal nodule, right adrenal thickening. No hydronephrosis. Nonobstructing stone versus vascular calcification in the right kidney. Left renal cyst. Foley catheter decompresses the urinary bladder.  Stomach/Bowel: Bowel evaluation is limited in the absence of enteric contrast and paucity of intra-abdominal fat. Enteric tube decompresses the stomach. No evidence of bowel obstruction. Colonic diverticulosis distally, no evidence diverticulitis. More detailed assessment is limited.  Vascular/Lymphatic: Infrarenal aortic aneurysm maximal dimension 4.1 cm. Aortic atherosclerosis and tortuosity. Right retroperitoneal hemorrhage but no periaortic stranding. Bilateral common iliac artery aneurysms, both at 2.2 cm. Left femoral catheter with tip in the region of the internal external iliac confluence, the venous structures not well-defined on the current exam. The limited assessment for adenopathy.  Reproductive: Prostate is unremarkable.  Other: Large right retroperitoneal hematoma as well as stranding throughout the right abdomen and pelvis. Small volume of free fluid in the right pericolic gutter which measures simple fluid density. Subcutaneous emphysema tracks from the chest through the anterior abdominal wall into the scrotum. There is prominent subcutaneous edema and skin thickening. Subcutaneous emphysema also tracks posteriorly along the gluteal regions.  Musculoskeletal: No acute fracture of the lumbar spine  or pelvis.  IMPRESSION: CT chest:  1. Large right hemothorax. Anterior mediastinal hematoma likely related to recent CPR and rib fractures. Right pigtail catheter with tip directed towards the apex. Decreased size of right pneumothorax. Residual pneumothorax versus bulla at the right lung apex, pneumothorax is otherwise resolved. 2. Persistent but improved diffuse subcutaneous emphysema about the chest wall. 3. Complete consolidation of the right lower lobe with air bronchograms, compressive atelectasis versus pneumonia. 4. Retained mucus within the distal trachea and mainstem bronchi. Suspected bronchomalacia. 5. Advanced emphysema again seen. 6. Right rib fractures as seen on recent exam. There is also a minimally displaced sternal fracture.  CT abdomen/pelvis:  1. Large right extraperitoneal/retroperitoneal hematoma. 2. Diffuse subcutaneous emphysema of the abdominal wall as well as soft tissue edema. 3. Infrarenal aortic aneurysm maximal dimension 4.1 cm. Bilateral common iliac artery aneurysms. No evidence of aneurysm rupture. Recommend followup by ultrasound in 1 year. This recommendation follows ACR consensus guidelines: White Paper of the ACR Incidental Findings Committee II on Vascular Findings. J Am Coll Radiol 2013; 10:789-794. Aortic aneurysm NOS (ICD10-I71.9) 4. Small amount of free fluid in the right pericolic gutter. 5. Nonobstructing right renal stone versus vascular calcification.  Aortic Atherosclerosis (ICD10-I70.0) and Emphysema (ICD10-J43.9).  These results will be called to the ordering clinician or representative by the Radiologist Assistant, and communication documented in the PACS or zVision Dashboard.   Electronically Signed   By: Keith Rake M.D.   On: 08/05/2019 02:48  Assessment/Plan:  He remains in shock with development of worsening right hemothorax at base and retroperitoneal hematoma on anticoagulation. He has no significant  ptx on chest CT. There is  a large bleb at the right apex which was present on prior CT in 11/2017. There is no indication for insertion of another right chest tube. The hemothorax is on the moderate size and is not causing any hemodynamic compromise other than the anemia due to blood loss. It will not be drained by a chest tube. He is not a candidate for VATS or thoracotomy at this time due to shock, severe COPD and advanced age. It may cause compressive atelectasis of RLL and ideally would need to be drained to allow complete expansion of RLL and vent weaning but in the short term is no causing a major problem. Would hold off on any anticoagulation, treat anemia and other causes of shock if you are going to be aggressive in the 84 year old with severe lung disease. The retroperitoneal hematoma should stabilize with stopping anticoagulation. There is no indication for surgical drainage.    LOS: 5 days    Gaye Pollack 08/05/2019

## 2019-08-05 NOTE — Progress Notes (Signed)
CRITICAL VALUE ALERT  Critical Value:  PCO2 - 73.7  Date & Time Notied:  08/05/19 0315  Provider Notified: Warren Lacy (Dr. Laurence Ivanoff)  Orders Received/Actions taken: No new orders at this time.   RN will continue to monitor pt closely.

## 2019-08-05 NOTE — Progress Notes (Signed)
ETT holder changed; ETT secured at 26 cm at the lip.

## 2019-08-05 NOTE — Progress Notes (Signed)
NAME:  Kyle Mosley, MRN:  [id number], DOB:  [birth year], LOS: 5 ADMISSION DATE:  08/19/2019, CHIEF COMPLAINT:  Cardiac arrest   Brief History   84 year old male presenting with syncopal episode, weakness, and lower leg swelling who decompensated in ER, found unresponsive in asystolic cardiac arrest, ROSC after 20 mins.  Developed severe diffuse subcutaneous air during CPR without obvious pneumothorax on CXR.  On vasopressor support for hypotension.  PCCM called for admit.   History of present illness   HPI obtained from medical chart review as patient is unresponsive on mechanical ventilation.    84 year old male with prior history of COPD on home O2/ steroid dependent, asthma, Afib on Xarelto, HFpEF, HTN, HLD, and pulmonary hypertension who presented from home after progressive lower extremity swelling for 3 weeks, using his inhaler more frequently but not complaining of SOB, who became weak today and collapsed.  Possible recent falls at home.    On arrival to ER, patient was alert and oriented.  Denied any chest pain or shortness of breath.  Some hypoxia treated for CAP/ COPD exacerbation in ER with magnesium, solumedrol, nebs, vancomycin, zosyn, and 1 L bolus with CXR showing possible right sided pneumonia.  He was about to admitted and then found unresponsive and in asystole.  ACLS measures performed.  During arrest, patient developed Vtach and defibrillated 3 times with ROSC after 20 mins.  During CPR, staff stated right side of chest developed subcutaneous air then becoming diffuse.  CXR without obvious pneumothorax.  Required levophed for ongoing hypotension.  Neurologically, remained unresponsive, some movement in upper arms to noxious stimuli.  PCCM called for admit. Pt was cooled and began to spontaneously awake on 9/10.    Past Medical History  COPD on home O2/ steroid dependent, HFpEF, Afib on Xarelto, HTN, HLD, pulmonary hypertension, enlarged prostate, anemia of chronic disease    Significant Hospital Events   9/8 Admit / cardiac arrest 9/9 Off pressors 9/10 Following commands  Consults:  CT surgery  Procedures:  9/8 ETT >> 9/8 foley >> 9/8 OGT >> 9/9 L femoral central line>>  Significant Diagnostic Tests:  9/8 [facility] >> No acute intracranial pathology,  Mild age-related atrophy and chronic microvascular ischemic changes. 9/9 CT chest  >>moderate R hydropneumonothorax, extensive subcutaneous emphysema, possible tear in anterior costochondral junction, patchy RLL opacity  9/9 EEG>>continuous generalized slowing, no epileptiform changes 9/9 bilat LE venous doppler>>no evidence DVT  Micro Data:  9/8 COVID >> neg 9/8 BCx 2 >> 9/8 trach asp >>  Antimicrobials:  9/8 vanc>>9/10 9/8 Cefepime>>  Interim history/subjective:  Extubated 9/11, initially successfully but then he developed significant hypercapnia.  Was placed on BiPAP and subsequently required reintubation Currently PRVC 460, RR 18, PEEP 0 Evolving shock with escalating pressor needs, phenylephrine 400, norepinephrine 36, vasopressin Noted to have acute anemia, hemoglobin as low as 5.1, received 2 units PRBC with appropriate response.  Heparin infusion was stopped Hyponatremia noted, renal function remained stable  9/13 required 1 u prbc overnight, appears to have appropriately bumped his Hb. Still on neosynephrine but titrating down on levo.  +500 cc today   200cc FW q 6 Hydrocort 50 Q  6 Objective   Blood pressure 102/66, pulse (!) 105, temperature (!) 97.2 F (36.2 C), resp. rate 19, height 6' (1.829 m), weight 73.5 kg, SpO2 100 %.    Vent Mode: PRVC FiO2 (%):  [50 %] 50 % Set Rate:  [18 bmp] 18 bmp Vt Set:  [460 mL]  460 mL PEEP:  [0 cmH20] 0 cmH20 Plateau Pressure:  [15 cmH20-24 cmH20] 15 cmH20   Intake/Output Summary (Last 24 hours) at 08/05/2019 1252 Last data filed at 08/05/2019 1200 Gross per 24 hour  Intake 9413.83 ml  Output 740 ml  Net 8673.83 ml   Filed Weights   08/01/2019  1721 08/01/19 0523 08/01/19 1400  Weight: 72.1 kg 73.9 kg 73.5 kg    General: Critically ill-appearing man, ventilated, awake and following simple commands  HEENT: ET tube is in place, oropharynx otherwise clear, pupils equal and react Neuro: Opens eyes to voice, nodded to questions, intermittently following commands CV: Distant, regular, tachycardic, no murmur PULM: Coarse bilaterally.  Right chest tube is in place and is to suction -20, no longer has an air leak or respiratory variation.  Blood-tinged fluid in the collection container - minimal blood in tube, flows down to container freely (does not appear clotted) GI: Soft, nondistended, hypoactive bowel sounds Extremities: 2+ lower extremity edema Skin: Stable subcutaneous air in his upper chest   Resolved Hospital Problem list   Cardiogenic shock  Assessment & Plan:   Asystolic Cardiac Arrest with subsequent Vtach probable cardiogenic shock  -Pt came into the ED awake and mildly hypoxic and was being treated for COPD exacerbation, at some point coded, Per hospitalist note walked into find patient unresponsive and pulseless, unknown down-time -ROSC after 20 minutes of CPR and defibrillation -unclear etiology for arrest, possibly ruptured bleb leading to PTX vs PE vs other PNA?  Cardiac arrhythmia? -EKG without STEMI -CT head negative, CT chest (no contrast)  -Now with progressive shock, at least a component of hemorrhagic given acute drop in hemoglobin 9/11 CT abd and chest: RP hemorrhage, Hemothorax, hematoma in chest wall.  Appears to have tamponaded off.   Also stool hemoccult positive (no frank bleeding or melena)  P:  -Still not repeated his echocardiogram given the subcutaneous air and difficult interpretation -Heparin discontinued due to bleed. NO FURTHER ANTICOAGULATION.  Holding ASA  Discussed with CT surgery.  No plan for surgery or thoracostomy given overall instability. Agree.   PE was only suspected, never  confirmed.    Was formally on Eliquis for A. fib -PRBC resuscitated, follow CBC for any evidence of further bleeding. Cont empiric cefepime for PNA.  WBC very elevated since yesterday (started steroids) Remains on neosynephrine, lower dose levophed, vasopressin.  Wean off as tolerates.    Acute hypoxic respiratory failure with R-sided pneumothorax and infiltrate -No air leak now Underlying COPD on baseline 3L  -R-sided infiltrate on CT -no wheezing on exam procal elevated.   P: -He initially tolerated extubation, did not have profound work of breathing, but did not ventilate adequately due to his continued air leak.  Ultimately required reintubation.  Currently ventilating on PEEP 0, airleak appears to have resolved.  Question clot if he had intrapleural bleeding.  At risk for hemothorax.  Continue to ventilate with the settings.  Assess him for possible extubation again if we can stabilize him hemodynamically Cont current Chest tube care, suction. PTX resolved.  -Follow chest x-ray -Empiric cefepime as above for suspected pneumonia versus pulmonary contusion  AKI:  Cr 1.42 today (up from 0.9)   Acute encephalopathy following cardiac arrest -Spoke with son who reports at baseline, pt has mild memory issues, but lives independently with wife and completes ADL's, baseline oriented -CT head without acute findings -pt responsive this morning, EEG with diffuse slowing P -minimize sedation, weaning fentanyl -Prognosis is guarded, continue to  monitor mental status and neurologic recovery  Acute blood loss anemia, as above  -Hemothorax and RP bleed. Monitor H/H  Shock.  Most recent decompensation most consistent with acute blood loss anemia, evolving respiratory and metabolic acidosis. Consider component of sepsis given hypothermia, wbc (could be from steroids), persistent hypotension despite blood transfusions.  P -Follow CBC and support with PRBC as necessary to maintain hemoglobin  greater than 8.0. -Wean phenylephrine and norepinephrine as able, continue vasopressin -Follow lactic acid -Minimize sedating medications as able  History of Atrial fibrillation P:  -Continue to follow telemetry -Heparin discontinued given his acute blood loss -Diltiazem on hold  Hx HTN, HFpEF P:  -We will retry his echocardiogram once subcutaneous air is improved -Metoprolol discontinued  Extensive subcutaneous air and UE skin weeping -bandages to arms, poor peripheral IV access P: -Continue wound care  Best practice:  Diet: NPO Pain/Anxiety/Delirium protocol (if indicated): prn fentanyl /versed VAP protocol (if indicated): yes DVT prophylaxis: heparin SQ GI prophylaxis: PPI Glucose control: SSI Mobility: BR Code Status: Full code Family Communication: Have discussed the status, problems, plans with sons at bedside daily.  Goals have been to continue all aggressive care to give him an opportunity to stabilize, improve.  Unfortunately he is recurrent respiratory failure, new progressive shock may represent setbacks from when she cannot recover.  We will have to broach the subject of continued support given these new findings. Disposition:  ICU   Labs   CBC: Recent Labs  Lab 08/02/2019 1726  08/02/19 0432 08/03/19 0303  08/04/19 0125 08/04/19 0500 08/04/19 1812 08/05/19 0257 08/05/19 0308 08/05/19 0728  WBC 4.8   < > 12.9* 14.0*  --  11.6*  --  33.3* 30.3*  --   --   NEUTROABS 3.8  --   --   --   --   --   --   --   --   --   --   HGB 9.0*   < > 9.1* 7.9*   < > 5.6* 8.1* 6.1* 7.0* 6.5* 7.6*  HCT 32.2*   < > 30.5* 27.4*   < > 19.4* 26.8* 19.3* 22.5* 19.0* 24.5*  MCV 101.9*   < > 97.4 100.0  --  102.6*  --  97.0 96.6  --   --   PLT 241   < > 201 195  --  147*  --  PLATELET CLUMPS NOTED ON SMEAR, COUNT APPEARS DECREASED 95*  --   --    < > = values in this interval not displayed.    Basic Metabolic Panel: Recent Labs  Lab 08/01/19 0452  08/02/19 0432 08/02/19  0804 08/02/19 2052 08/03/19 0303 08/03/19 0700  08/04/19 0001 08/04/19 0115 08/04/19 0125 08/05/19 0257 08/05/19 0308  NA  --    < > 146* 148* 145  --  147*   < > 149* 150* 150* 148* 148*  K  --    < > 4.2 4.1 3.8  --  3.9   < > 4.4 4.6 5.1 4.6 4.4  CL  --    < > 102 103 104  --  107  --   --   --  110 114*  --   CO2  --    < > 35* 34* 36*  --  34*  --   --   --  35* 28  --   GLUCOSE  --    < > 96 99 121*  --  121*  --   --   --  89 145*  --   BUN  --    < > 40* 42* 43*  --  46*  --   --   --  51* 59*  --   CREATININE  --    < > 0.94 0.97 0.79  --  0.70  --   --   --  0.91 1.42*  --   CALCIUM  --    < > 7.5* 7.5* 7.5*  --  7.7*  --   --   --  7.4* 6.7*  --   MG 2.3  --  2.3  --   --  2.3  --   --   --   --  2.5*  --   --   PHOS 4.1  --   --   --   --   --   --   --   --   --   --   --   --    < > = values in this interval not displayed.   GFR: Estimated Creatinine Clearance: 41.7 mL/min (A) (by C-G formula based on SCr of 1.42 mg/dL (H)). Recent Labs  Lab 08/02/19 0432 08/03/19 0303 08/03/19 1122 08/04/19 0125  08/04/19 1228 08/04/19 1525 08/04/19 1812 08/05/19 0257  PROCALCITON 0.65  --  0.33 0.24  --   --   --   --  4.72  WBC 12.9* 14.0*  --  11.6*  --   --   --  33.3* 30.3*  LATICACIDVEN  --   --  1.0  --    < > 3.2* 4.1* 4.4* 3.4*   < > = values in this interval not displayed.    Liver Function Tests: Recent Labs  Lab 08/02/19 0432  AST 95*  ALT 69*  ALKPHOS 84  BILITOT 1.0  PROT 4.8*  ALBUMIN 1.9*   No results for input(s): LIPASE, AMYLASE in the last 168 hours. No results for input(s): AMMONIA in the last 168 hours.  ABG    Component Value Date/Time   PHART 7.219 (L) 08/05/2019 0308   PCO2ART 73.7 (HH) 08/05/2019 0308   PO2ART 69.0 (L) 08/05/2019 0308   HCO3 30.4 (H) 08/05/2019 0308   TCO2 33 (H) 08/05/2019 0308   O2SAT 90.0 08/05/2019 0308     Coagulation Profile: Recent Labs  Lab 08/01/19 0429 08/04/19 0203  INR 1.4* 1.7*    Cardiac  Enzymes: No results for input(s): CKTOTAL, CKMB, CKMBINDEX, TROPONINI in the last 168 hours.  HbA1C: Hgb A1c MFr Bld  Date/Time Value Ref Range Status  06/30/2019 01:31 PM 5.5 4.8 - 5.6 % Final    Comment:    (NOTE) Pre diabetes:          5.7%-6.4% Diabetes:              >6.4% Glycemic control for   <7.0% adults with diabetes   02/18/2017 05:01 AM 5.8 (H) 4.8 - 5.6 % Final    Comment:    (NOTE)         Pre-diabetes: 5.7 - 6.4         Diabetes: >6.4         Glycemic control for adults with diabetes: <7.0     CBG: Recent Labs  Lab 08/04/19 1951 08/04/19 2355 08/05/19 0440 08/05/19 0805 08/05/19 1145  GLUCAP 117* 130* 135* 127* 129*     CRITICAL CARE Performed by: Collier Bullock   Total critical care time: 34 minutes  Baltazar Apo, MD, PhD  08/05/2019, 12:52 PM [facility] [phone] or if no answer [phone]

## 2019-08-05 NOTE — Progress Notes (Signed)
[facility] Progress Note Patient Name: JAIRON ARDELEAN DOB: 18-Jan-1935 MRN: [id number]   Date of Service  08/05/2019  HPI/Events of Note  ABG on 50%/PRVC 22/TV 460/P 0 = 7.187/54.9/74.0  eICU Interventions  Will order: 1. Increase PRVC rate to 26. 2. Repeat ABG at 12:45 AM.      Intervention Category Major Interventions: Acid-Base disturbance - evaluation and management;Respiratory failure - evaluation and management  Lysle Dingwall 08/05/2019, 11:40 PM

## 2019-08-06 ENCOUNTER — Inpatient Hospital Stay (HOSPITAL_COMMUNITY): Payer: Medicare HMO

## 2019-08-06 DIAGNOSIS — N179 Acute kidney failure, unspecified: Secondary | ICD-10-CM

## 2019-08-06 LAB — POCT I-STAT EG7
Acid-base deficit: 5 mmol/L — ABNORMAL HIGH (ref 0.0–2.0)
Bicarbonate: 21 mmol/L (ref 20.0–28.0)
Calcium, Ion: 1.11 mmol/L — ABNORMAL LOW (ref 1.15–1.40)
HCT: 26 % — ABNORMAL LOW (ref 39.0–52.0)
Hemoglobin: 8.8 g/dL — ABNORMAL LOW (ref 13.0–17.0)
O2 Saturation: 42 %
Patient temperature: 97.5
Potassium: 4.1 mmol/L (ref 3.5–5.1)
Sodium: 142 mmol/L (ref 135–145)
TCO2: 22 mmol/L (ref 22–32)
pCO2, Ven: 43.8 mmHg — ABNORMAL LOW (ref 44.0–60.0)
pH, Ven: 7.286 (ref 7.250–7.430)
pO2, Ven: 26 mmHg — CL (ref 32.0–45.0)

## 2019-08-06 LAB — POCT I-STAT 7, (LYTES, BLD GAS, ICA,H+H)
Acid-base deficit: 5 mmol/L — ABNORMAL HIGH (ref 0.0–2.0)
Bicarbonate: 22.4 mmol/L (ref 20.0–28.0)
Calcium, Ion: 1.09 mmol/L — ABNORMAL LOW (ref 1.15–1.40)
HCT: 22 % — ABNORMAL LOW (ref 39.0–52.0)
Hemoglobin: 7.5 g/dL — ABNORMAL LOW (ref 13.0–17.0)
O2 Saturation: 91 %
Patient temperature: 36.1
Potassium: 4.1 mmol/L (ref 3.5–5.1)
Sodium: 147 mmol/L — ABNORMAL HIGH (ref 135–145)
TCO2: 24 mmol/L (ref 22–32)
pCO2 arterial: 54.8 mmHg — ABNORMAL HIGH (ref 32.0–48.0)
pH, Arterial: 7.215 — ABNORMAL LOW (ref 7.350–7.450)
pO2, Arterial: 70 mmHg — ABNORMAL LOW (ref 83.0–108.0)

## 2019-08-06 LAB — BPAM RBC
Blood Product Expiration Date: 202009142359
Blood Product Expiration Date: 202009262359
Blood Product Expiration Date: 202009262359
Blood Product Expiration Date: 202010012359
Blood Product Expiration Date: 202010022359
ISSUE DATE / TIME: 202009120142
ISSUE DATE / TIME: 202009120245
ISSUE DATE / TIME: 202009122031
ISSUE DATE / TIME: 202009130419
ISSUE DATE / TIME: 202009131915
Unit Type and Rh: 6200
Unit Type and Rh: 6200
Unit Type and Rh: 6200
Unit Type and Rh: 6200
Unit Type and Rh: 9500

## 2019-08-06 LAB — TYPE AND SCREEN
ABO/RH(D): A POS
Antibody Screen: NEGATIVE
Unit division: 0
Unit division: 0
Unit division: 0
Unit division: 0
Unit division: 0

## 2019-08-06 LAB — CBC
HCT: 25.8 % — ABNORMAL LOW (ref 39.0–52.0)
Hemoglobin: 8.1 g/dL — ABNORMAL LOW (ref 13.0–17.0)
MCH: 30.7 pg (ref 26.0–34.0)
MCHC: 31.4 g/dL (ref 30.0–36.0)
MCV: 97.7 fL (ref 80.0–100.0)
Platelets: 85 10*3/uL — ABNORMAL LOW (ref 150–400)
RBC: 2.64 MIL/uL — ABNORMAL LOW (ref 4.22–5.81)
RDW: 17.1 % — ABNORMAL HIGH (ref 11.5–15.5)
WBC: 31.8 10*3/uL — ABNORMAL HIGH (ref 4.0–10.5)
nRBC: 2.9 % — ABNORMAL HIGH (ref 0.0–0.2)

## 2019-08-06 LAB — PREPARE FRESH FROZEN PLASMA: Unit division: 0

## 2019-08-06 LAB — HEMOGLOBIN AND HEMATOCRIT, BLOOD
HCT: 25.5 % — ABNORMAL LOW (ref 39.0–52.0)
HCT: 27.8 % — ABNORMAL LOW (ref 39.0–52.0)
Hemoglobin: 8.1 g/dL — ABNORMAL LOW (ref 13.0–17.0)
Hemoglobin: 8.7 g/dL — ABNORMAL LOW (ref 13.0–17.0)

## 2019-08-06 LAB — BPAM FFP
Blood Product Expiration Date: 202009132359
Blood Product Expiration Date: 202009132359
ISSUE DATE / TIME: 202009132015
ISSUE DATE / TIME: 202009132227
Unit Type and Rh: 6200
Unit Type and Rh: 6200

## 2019-08-06 LAB — BASIC METABOLIC PANEL
Anion gap: 6 (ref 5–15)
BUN: 65 mg/dL — ABNORMAL HIGH (ref 8–23)
CO2: 25 mmol/L (ref 22–32)
Calcium: 6.6 mg/dL — ABNORMAL LOW (ref 8.9–10.3)
Chloride: 113 mmol/L — ABNORMAL HIGH (ref 98–111)
Creatinine, Ser: 1.87 mg/dL — ABNORMAL HIGH (ref 0.61–1.24)
GFR calc Af Amer: 38 mL/min — ABNORMAL LOW (ref >60)
GFR calc non Af Amer: 33 mL/min — ABNORMAL LOW (ref >60)
Glucose, Bld: 134 mg/dL — ABNORMAL HIGH (ref 70–99)
Potassium: 4.3 mmol/L (ref 3.5–5.1)
Sodium: 144 mmol/L (ref 135–145)

## 2019-08-06 LAB — MAGNESIUM: Magnesium: 2.2 mg/dL (ref 1.7–2.4)

## 2019-08-06 LAB — GLUCOSE, CAPILLARY
Glucose-Capillary: 115 mg/dL — ABNORMAL HIGH (ref 70–99)
Glucose-Capillary: 70 mg/dL (ref 70–99)

## 2019-08-06 MED ORDER — AMIODARONE LOAD VIA INFUSION
150.0000 mg | Freq: Once | INTRAVENOUS | Status: AC
  Administered 2019-08-06: 150 mg via INTRAVENOUS
  Filled 2019-08-06: qty 83.34

## 2019-08-06 MED ORDER — MORPHINE BOLUS VIA INFUSION
5.0000 mg | INTRAVENOUS | Status: DC | PRN
  Filled 2019-08-06: qty 5

## 2019-08-06 MED ORDER — LORAZEPAM 2 MG/ML IJ SOLN: 2.0000 mg | INTRAMUSCULAR | Status: DC | PRN

## 2019-08-06 MED ORDER — STERILE WATER FOR INJECTION IV SOLN
INTRAVENOUS | Status: DC
  Administered 2019-08-06: 02:00:00 via INTRAVENOUS
  Filled 2019-08-06: qty 850

## 2019-08-06 MED ORDER — HALOPERIDOL LACTATE 5 MG/ML IJ SOLN: 2.5000 mg | INTRAMUSCULAR | Status: DC | PRN

## 2019-08-06 MED ORDER — CALCIUM GLUCONATE-NACL 1-0.675 GM/50ML-% IV SOLN
1.0000 g | Freq: Once | INTRAVENOUS | Status: DC
  Filled 2019-08-06: qty 50

## 2019-08-06 MED ORDER — DIPHENHYDRAMINE HCL 50 MG/ML IJ SOLN: 25.0000 mg | INTRAMUSCULAR | Status: DC | PRN

## 2019-08-06 MED ORDER — SODIUM CHLORIDE 0.9% FLUSH: 10.0000 mL | INTRAVENOUS | Status: DC | PRN

## 2019-08-06 MED ORDER — GLYCOPYRROLATE 0.2 MG/ML IJ SOLN: 0.2000 mg | INTRAMUSCULAR | Status: DC | PRN

## 2019-08-06 MED ORDER — SODIUM CHLORIDE 0.9 % IV SOLN: INTRAVENOUS | Status: DC | PRN

## 2019-08-06 MED ORDER — MORPHINE SULFATE (PF) 2 MG/ML IV SOLN: 2.0000 mg | INTRAVENOUS | Status: DC | PRN

## 2019-08-06 MED ORDER — ACETAMINOPHEN 650 MG RE SUPP: 650.0000 mg | Freq: Four times a day (QID) | RECTAL | Status: DC | PRN

## 2019-08-06 MED ORDER — SODIUM BICARBONATE 8.4 % IV SOLN
50.0000 meq | Freq: Once | INTRAVENOUS | Status: AC
  Administered 2019-08-06: 02:00:00 50 meq via INTRAVENOUS
  Filled 2019-08-06: qty 50

## 2019-08-06 MED ORDER — ACETAMINOPHEN 325 MG PO TABS: 650.0000 mg | ORAL_TABLET | Freq: Four times a day (QID) | ORAL | Status: DC | PRN

## 2019-08-06 MED ORDER — GLYCOPYRROLATE 1 MG PO TABS
1.0000 mg | ORAL_TABLET | ORAL | Status: DC | PRN
  Filled 2019-08-06: qty 1

## 2019-08-06 MED ORDER — SODIUM CHLORIDE 0.9 % IV SOLN
2.0000 g | Freq: Two times a day (BID) | INTRAVENOUS | Status: DC
  Filled 2019-08-06 (×2): qty 2

## 2019-08-06 MED ORDER — AMIODARONE HCL IN DEXTROSE 360-4.14 MG/200ML-% IV SOLN
60.0000 mg/h | INTRAVENOUS | Status: DC
  Administered 2019-08-06 (×2): 60 mg/h via INTRAVENOUS
  Filled 2019-08-06 (×2): qty 200

## 2019-08-06 MED ORDER — DEXTROSE 10 % IV SOLN: INTRAVENOUS | Status: DC

## 2019-08-06 MED ORDER — AMIODARONE HCL IN DEXTROSE 360-4.14 MG/200ML-% IV SOLN
30.0000 mg/h | INTRAVENOUS | Status: DC
  Administered 2019-08-06: 30 mg/h via INTRAVENOUS

## 2019-08-06 MED ORDER — POLYVINYL ALCOHOL 1.4 % OP SOLN: 1.0000 [drp] | Freq: Four times a day (QID) | OPHTHALMIC | Status: DC | PRN

## 2019-08-06 MED ORDER — MORPHINE 100MG IN NS 100ML (1MG/ML) PREMIX INFUSION
0.0000 mg/h | INTRAVENOUS | Status: DC
  Administered 2019-08-06: 12:00:00 5 mg/h via INTRAVENOUS
  Filled 2019-08-06: qty 100

## 2019-08-09 LAB — POCT I-STAT 7, (LYTES, BLD GAS, ICA,H+H)
Acid-Base Excess: 1 mmol/L (ref 0.0–2.0)
Bicarbonate: 29.1 mmol/L — ABNORMAL HIGH (ref 20.0–28.0)
Calcium, Ion: 1.15 mmol/L (ref 1.15–1.40)
HCT: 16 % — ABNORMAL LOW (ref 39.0–52.0)
Hemoglobin: 5.4 g/dL — CL (ref 13.0–17.0)
O2 Saturation: 90 %
Patient temperature: 37.1
Potassium: 4.6 mmol/L (ref 3.5–5.1)
Sodium: 148 mmol/L — ABNORMAL HIGH (ref 135–145)
TCO2: 31 mmol/L (ref 22–32)
pCO2 arterial: 76.4 mmHg (ref 32.0–48.0)
pH, Arterial: 7.189 — CL (ref 7.350–7.450)
pO2, Arterial: 77 mmHg — ABNORMAL LOW (ref 83.0–108.0)

## 2019-08-10 LAB — CULTURE, RESPIRATORY W GRAM STAIN: Culture: NORMAL

## 2019-08-13 ENCOUNTER — Ambulatory Visit: Payer: Medicare HMO | Admitting: Family Medicine

## 2019-08-14 ENCOUNTER — Telehealth: Payer: Medicare HMO | Admitting: Cardiovascular Disease

## 2019-08-15 ENCOUNTER — Telehealth: Payer: Self-pay | Admitting: Pulmonary Disease

## 2019-08-15 NOTE — Telephone Encounter (Signed)
08/15/2019 I received signed D/C back from Dr. Elsworth Soho. I will call Ronita Hipps at [facility] to pick up. PWR

## 2019-08-23 NOTE — Progress Notes (Signed)
84 year old man with COPD on home O2, atrial fibrillation, HFpEF admitted with asystolic cardiac arrest and 20-minute downtime on 9/8.  He developed extensive subcu emphysema, rib fractures and sternal fracture, failed extubation on 9/11 due to large air leak on right with inability to ventilate.  He remains in shock on Levophed, vasopressin and phenylephrine, found to have large right retroperitoneal hematoma.  On exam-unresponsive on multiple drips as above, does not follow simple verbal commands, pupils 3 mm bilaterally equally reactive to light, dry mucosa, 2+ feeding edema, crepitus over right chest wall, decreased breath sounds bilateral, no air leak on chest tube, soft and nontender abdomen.  Chest x-ray 9/14 was personally reviewed which shows extensive very disease on the right without any evidence of pneumothorax. CT chest and abdomen from 9/13 was reviewed.  ABG shows acute respiratory acidosis. Labs show rising creatinine 1.8, mild hypocalcemia, severe leukocytosis and stable anemia.  Impression/plan  Shock post asystolic cardiac arrest-felt to be hemorrhagic due to right hemothorax and retroperitoneal hematoma but hemoglobin is now stable, could this be cardiogenic, will repeat echo, no evidence of sepsis can discontinue stress dose steroids -Continue empiric cefepime for H CAP. -Started on amiodarone 9/13 for atrial fibrillation  Acute hypoxic/hypercarbic respiratory failure -repeat ABG today, now ventilating better based on tidal volumes so expect PCO2 to have  Feel that due to sternal and rib fractures would be quite unlikely that he would tolerate extubation and if we are to push forward will likely require tracheostomy  AKI -continue bicarbonate drip, now oliguric, will need to discuss dialysis as part of goals of care  Acute blood loss anemia due to retroperitoneal bleed and hemothorax-hemoglobin improved, will aim for 7 and above, INR has been corrected  The patient is  critically ill with multiple organ systems failure and requires high complexity decision making for assessment and support, frequent evaluation and titration of therapies, application of advanced monitoring technologies and extensive interpretation of multiple databases. Critical Care Time devoted to patient care services described in this note independent of APP/resident  time is 35 minutes.   Leanna Sato Elsworth Soho MD

## 2019-08-23 NOTE — Progress Notes (Signed)
Discarded 90 ml Morphine bag into steri cycle with [employer]  Corine Solorio Aug 28, 2019 1330h

## 2019-08-23 NOTE — Progress Notes (Signed)
Referred pt to CDS per bedside RN request. CDS ordered to call with cardia time of death. This information also noted in post-mortem tab.  Bedside RN notified.

## 2019-08-23 NOTE — Procedures (Signed)
Extubation Procedure Note  Patient Details:   Name: Kyle Mosley DOB: March 12, 1935 MRN: [id number]   Airway Documentation:    Vent end date: 08-07-2019 Vent end time: 1154   Evaluation  O2 sats: stable throughout Complications: No apparent complications Patient did tolerate procedure well. Bilateral Breath Sounds: Rhonchi, Coarse crackles   No, pt was unable to speak.  Pt transitioned to comfort care and extubated per the family's wishes and the physician's order.  Earney Navy August 07, 2019, 11:55 AM

## 2019-08-23 NOTE — Progress Notes (Signed)
RT attempted to place arterial line X3 without success. Obtained flash with blood return, but could not thread catheter. RN aware. RT will continue to monitor.

## 2019-08-23 NOTE — Progress Notes (Signed)
PT Cancellation Note  Patient Details Name: Kyle Mosley MRN: [id number] DOB: [birth year]   Cancelled Treatment:    Reason Eval/Treat Not Completed: Medical issues which prohibited therapy;Patient's level of consciousness(Pt coded over weekend and now VDRF.  Sign off per nurse)   Denice Paradise 17-Aug-2019, 10:35 AM [facility] Pager:  [phone]  Office:  [phone]

## 2019-08-23 NOTE — Progress Notes (Signed)
Nutrition Brief Note  Chart reviewed. Per CCM note, pt now transitioning to comfort care.  No further nutrition interventions warranted at this time.  Please re-consult as needed.    Gaynell Face, MS, RD, LDN Inpatient Clinical Dietitian Pager: [phone] Weekend/After Hours: [phone]

## 2019-08-23 NOTE — Progress Notes (Addendum)
Updated patient's spouse, Brentyn Skoog, and son, Fleet Contras, by telephone of current findings of multisystem organ failure.  I discussed medical decisions and level of care regarding patient's condition with patient's family/surrogate as the patient is unable to participate at this time.  These discussions are necessary given the patient's continued deterioration and need for decision regarding continuation of mechanical ventilation, resuscitation and other interventions such as invasive procedures.  They requested to contact remaining family members and discuss goals of care and requested I call back.  I called Merry Proud back by telephone approx 10:35am.  The family/surrogate has decided to discontinue medical therapy, DO NOT RESUSCITATE patient in case of cardiac arrest, and proceed with comfort measures.  DNR/comfort measures updated in EMR.

## 2019-08-23 NOTE — Progress Notes (Signed)
Physical Therapy Discharge Patient Details Name: Kyle Mosley MRN: [id number] DOB: 1935-11-05 Today's Date: Aug 26, 2019 Time:  -     Patient discharged from PT services secondary to medical decline - will need to re-order PT to resume therapy services.  Please see latest therapy progress note for current level of functioning and progress toward goals.    Progress and discharge plan discussed with patient and/or caregiver: Patient/Caregiver agrees with plan  GP     Denice Paradise Aug 26, 2019, 10:36 AM  [facility] Pager:  [phone]  Office:  [phone]

## 2019-08-23 NOTE — Progress Notes (Signed)
Multiple concerns (Intermmittent response to commands, neuro status in question,  Afib RVR, ABG result, hypotension, Lab result of elevated WBC called to CCM doctor overnight for patient.See MAR and progress notes for corresponding interventions. Awaiting response for elevated WBC. Will continue to monitor patient.

## 2019-08-23 NOTE — Progress Notes (Signed)
  Subjective: Events of weekend noted, on vent not responding  Objective: Vital signs in last 24 hours: Temp:  [96.8 F (36 C)-97.7 F (36.5 C)] 97 F (36.1 C) (09/14 0700) Pulse Rate:  [91-118] 103 (09/14 0720) Cardiac Rhythm: Atrial fibrillation (09/14 0400) Resp:  [13-26] 26 (09/14 0720) BP: (82-117)/(44-80) 117/62 (09/14 0720) SpO2:  [94 %-100 %] 98 % (09/14 0720) Arterial Line BP: (43-139)/(29-66) 87/47 (09/14 0500) FiO2 (%):  [40 %-50 %] 40 % (09/14 0720)  Hemodynamic parameters for last 24 hours:    Intake/Output from previous day: 09/13 0701 - 09/14 0700 In: 8257.2 [I.V.:6899.8; Blood:1057.3; IV Piggyback:300.1] Out: 709 [Urine:149; Emesis/NG output:15; Chest Tube:545] Intake/Output this shift: No intake/output data recorded.  General appearance: intubated Heart: irregularly irregular rhythm Lungs: rhonchi bilaterally no air leak  Lab Results: Recent Labs    08/05/19 0257  August 19, 2019 0402 08-19-2019 0606  WBC 30.3*  --  31.8*  --   HGB 7.0*   < > 8.1* 8.8*  HCT 22.5*   < > 25.8* 26.0*  PLT 95*  --  85*  --    < > = values in this interval not displayed.   BMET:  Recent Labs    08/05/19 0257  2019/08/19 0402 2019-08-19 0606  NA 148*   < > 144 142  K 4.6   < > 4.3 4.1  CL 114*  --  113*  --   CO2 28  --  25  --   GLUCOSE 145*  --  134*  --   BUN 59*  --  65*  --   CREATININE 1.42*  --  1.87*  --   CALCIUM 6.7*  --  6.6*  --    < > = values in this interval not displayed.    PT/INR:  Recent Labs    08/05/19 1752  LABPROT 37.9*  INR 3.9*   ABG    Component Value Date/Time   PHART 7.215 (L) August 19, 2019 0053   HCO3 21.0 08-19-2019 0606   TCO2 22 08-19-2019 0606   ACIDBASEDEF 5.0 (H) 2019/08/19 0606   O2SAT 42.0 August 19, 2019 0606   CBG (last 3)  Recent Labs    08/05/19 2003 08/05/19 2249 08-19-2019 0418  GLUCAP 109* 91 115*    Assessment/Plan: S/P  - Iatrogenic pneumothorax after CPR for cardiac arrest  No air leak, CT drained ~ 545 ml  yesterday- leave in place No CXR yet this AM Remains critically ill requiring multiple pressors and full ventilator support Acidotic with oliguria and rising creatinine Plan per CCM   LOS: 6 days    Melrose Nakayama 08/19/19

## 2019-08-23 NOTE — Progress Notes (Signed)
eLink Physician-Brief Progress Note Patient Name: Kyle Mosley DOB: 01-12-1935 MRN: [id number]   Date of Service  08/20/2019  HPI/Events of Note  AFIB with RVR - Ventricular rate = 130's. Currently on a Phenylephrine and Norepinephrine IV infusions.   eICU Interventions  Will order: 1. BMP and MG++ level now.  2. Amiodarone IV load and infusion.      Intervention Category Major Interventions: Arrhythmia - evaluation and management  Rc Amison Eugene 2019/08/20, 3:45 AM

## 2019-08-23 NOTE — Progress Notes (Signed)
Pharmacy Antibiotic Note  Kyle Mosley is a 84 y.o. male admitted on 08/01/2019 with pneumonia.  Pharmacy has been consulted for cefepime dosing.  Patient now in multisystem organ failure, scr has increased to 1.8 and renal may be consulted. No fevers noted, but wbc remains elevated at 31.8 (also on stress dose HCT). Cultures have been no growth.   9/8 COVID >>neg 9/8 BCx 2 >> ngF 9/9 MRSA PCR >>neg  9/8 vanc >>9/10 9/9 cefepime >>  Plan: Adjust cefepime to 2g q12 hours and follow renal function carefully.   Height: 6' (182.9 cm) Weight: 162 lb (73.5 kg) IBW/kg (Calculated) : 77.6  Temp (24hrs), Avg:97.4 F (36.3 C), Min:96.8 F (36 C), Max:97.7 F (36.5 C)  Recent Labs  Lab 08/02/19 2052 08/03/19 0303 08/03/19 0700  08/04/19 0125  08/04/19 0809 08/04/19 1228 08/04/19 1525 08/04/19 1812 08/05/19 0257 08/11/2019 0402  WBC  --  14.0*  --   --  11.6*  --   --   --   --  33.3* 30.3* 31.8*  CREATININE 0.79  --  0.70  --  0.91  --   --   --   --   --  1.42* 1.87*  LATICACIDVEN  --   --   --    < >  --    < > 3.9* 3.2* 4.1* 4.4* 3.4*  --    < > = values in this interval not displayed.    Estimated Creatinine Clearance: 31.7 mL/min (A) (by C-G formula based on SCr of 1.87 mg/dL (H)).    No Known Allergies   Thank you for allowing pharmacy to be a part of this patient's care.  Erin Hearing PharmD., BCPS Clinical Pharmacist 08/11/19 9:09 AM

## 2019-08-23 NOTE — Progress Notes (Signed)
[facility] Progress Note Patient Name: Kyle Mosley DOB: Sep 24, 1935 MRN: [id number]   Date of Service  08-12-19  HPI/Events of Note  ABG on 50%/PRVC 26/TV 460/P5 = 7.215/54.8/70.0. The pH is marginally better. The Time flow curve reveals some evidence of air trapping as the flow-time curve doesn't get to 0 in the exhalation phase. Therefore, will not attempt to increase PRVC rate or TV to increase Ve.  eICU Interventions  Will order: 1. NaHCO3 50 meq IV now.  2. NaHCO3 IV infusion to run IV at 50 mL/hour.  3. Repeat ABG at 5 AM.     Intervention Category Major Interventions: Acid-Base disturbance - evaluation and management;Respiratory failure - evaluation and management  Sommer,Steven Eugene 08/12/2019, 1:18 AM

## 2019-08-23 NOTE — Progress Notes (Addendum)
NAME:  Kyle Mosley, MRN:  [id number], DOB:  [birth year], LOS: 6 ADMISSION DATE:  08/20/2019, CHIEF COMPLAINT:  Cardiac arrest   Brief History   84 year old male presenting with syncopal episode, weakness, and lower leg swelling who decompensated in ER, found unresponsive in asystolic cardiac arrest, ROSC after 20 mins.  Developed severe diffuse subcutaneous air during CPR without obvious pneumothorax on CXR.  On vasopressor support for hypotension.  PCCM called for admit.   History of present illness   HPI obtained from medical chart review as patient is unresponsive on mechanical ventilation.    84 year old male with prior history of COPD on home O2/ steroid dependent, asthma, Afib on Xarelto, HFpEF, HTN, HLD, and pulmonary hypertension who presented from home after progressive lower extremity swelling for 3 weeks, using his inhaler more frequently but not complaining of SOB, who became weak today and collapsed.  Possible recent falls at home.    On arrival to ER, patient was alert and oriented.  Denied any chest pain or shortness of breath.  Some hypoxia treated for CAP/ COPD exacerbation in ER with magnesium, solumedrol, nebs, vancomycin, zosyn, and 1 L bolus with CXR showing possible right sided pneumonia.  He was about to admitted and then found unresponsive and in asystole.  ACLS measures performed.  During arrest, patient developed Vtach and defibrillated 3 times with ROSC after 20 mins.  During CPR, staff stated right side of chest developed subcutaneous air then becoming diffuse.  CXR without obvious pneumothorax.  Required levophed for ongoing hypotension.  Neurologically, remained unresponsive, some movement in upper arms to noxious stimuli.  PCCM called for admit. Pt was cooled and began to spontaneously awake on 9/10.    Past Medical History  COPD on home O2/ steroid dependent, HFpEF, Afib on Xarelto, HTN, HLD, pulmonary hypertension, enlarged prostate, anemia of chronic disease    Significant Hospital Events   9/8 Admit/cardiac arrest 9/9 Off pressors 9/10 Following commands  Consults:  CT surgery  Procedures:  9/8 ETT >> 9/8 Foley >> 9/8 OGT >> 9/9 L femoral central line>> 9/9 R lateral 22F CT>>9/9 9/9 R anterior 24F CT>>   Significant Diagnostic Tests:  9/8 CTH >> No acute intracranial pathology,  Mild age-related atrophy and chronic microvascular ischemic changes. 9/9 CT chest  >>moderate R hydropneumonothorax, extensive subcutaneous emphysema, possible tear in anterior costochondral junction, patchy RLL opacity  9/9 EEG>>continuous generalized slowing, no epileptiform changes 9/9 bilat LE venous doppler>>no evidence DVT 9/14 CXR: R apical bulla noted, CT in place, no obvious pneumothorax, significant right sided interstitial and airspace opacities. ETT in adequate position. Gastric tube in place.   Micro Data:  9/8 COVID >> neg 9/8 BCx 2 >>NGTD 9/8 trach asp >>  Antimicrobials:  9/8 vanc>>9/10 9/8 Cefepime>>  Interim history/subjective:  Extubated 9/11, initially successfully but then he developed significant hypercapnia.  Was placed on BiPAP and subsequently required reintubation   9/14 AF RVR overnight requiring amiodarone.  Reportedly intermittently following commands overnight but is not this am.  Afebrile with WBC 31.8-was started on stress dose steroids. Remains on PRVC 460, RR 18, PEEP 0 Continued shock on phenylephrine, norepinephrine, vasopressin.  Received 2U FFP last night for INR 3.9 Oliguric.  Objective   Blood pressure (!) 113/52, pulse 100, temperature (!) 97.3 F (36.3 C), resp. rate (!) 26, height 6' (1.829 m), weight 73.5 kg, SpO2 97 %.    Vent Mode: PRVC FiO2 (%):  [40 %-50 %] 40 % Set Rate:  [  18 bmp-26 bmp] 26 bmp Vt Set:  [460 mL] 460 mL PEEP:  [0 cmH20] 0 cmH20 Plateau Pressure:  [15 cmH20-22 cmH20] 15 cmH20   Intake/Output Summary (Last 24 hours) at 08-09-19 0805 Last data filed at 09-Aug-2019 0700 Gross per  24 hour  Intake 7963.44 ml  Output 684 ml  Net 7279.44 ml   Filed Weights   07/30/2019 1721 08/01/19 0523 08/01/19 1400  Weight: 72.1 kg 73.9 kg 73.5 kg    General: Critically ill-appearing, elderly male, obtunded HENT: Normocephalic, PERRL. Moist mucus membranes Neck: No JVD. Trachea midline. No thyromegaly, no lymphadenopathy CV: IRRR. S1S2. No MRG.  Unable to palpate distal pulses due to edema Lungs: BBS rhonchi with crackles, vent supported, full nonlabored. R anterior CT  ABD: +BS x4. SNT/ND. No masses, guarding or rigidity GU:  Foley in place.  Significant scrotal and penile edema EXT: Anasarca Skin: Pale, cool, dry.  Upper extremities are weeping with bandages intact. SubQ air R upper chest Neuro: Withdraws to noxious stimuli only.  Does not follow commands  Resolved Hospital Problem list   Cardiogenic shock  Assessment & Plan:   Asystolic Cardiac Arrest with subsequent Vtach probable cardiogenic shock  -Pt came into the ED awake and mildly hypoxic and was being treated for COPD exacerbation, at some point coded, Per hospitalist note walked into find patient unresponsive and pulseless, unknown down-time -ROSC after 20 minutes of CPR and defibrillation -unclear etiology for arrest, possibly ruptured bleb leading to PTX vs PE vs other PNA?  Cardiac arrhythmia? -EKG without STEMI -CT head negative, CT chest (no contrast)  -Now with progressive shock, at least a component of hemorrhagic given acute drop in hemoglobin 9/11 -CT abd and chest: RP hemorrhage, Hemothorax, hematoma in chest wall.  Appears to have tamponaded off.   Also stool hemoccult positive (no frank bleeding or melena) -Per CT surgery, no plan for surgery or thoracostomy given overall instability.   PE was only suspected, never confirmed.    Was formerly on Eliquis for A. Fib -Heparin discontinued due to bleed. NO FURTHER ANTICOAGULATION.  Holding ASA  P: -Still not repeated his echocardiogram given the  subcutaneous air and difficult interpretation -follow CBC for any evidence of further bleeding. -Cont empiric cefepime for PNA.  -WBC remains elevated (started steroids) -check procal (was rising 9/13)  -wean pressors for MAP goal 65  Acute hypoxic respiratory failure with R-sided pneumothorax and infiltrate -No air leak  Underlying COPD on baseline 3L  -R-sided infiltrate on CT -no wheezing on exam P: -Continue ventilator support to prevent eminent deterioration and further organ dysfunction from hypoxemia and hypercarbia.   -Patient is at risk for sudden hypoxia, barotrauma and hemodynamic compromise.   -Maintain SpO2 greater than or equal to 90%. -Head of bed elevated 30 degrees. -Plateau pressures less than 30 cm H20.  -Follow chest x-ray, ABG.   -SAT/SBT as tolerated. -Bronchial hygiene. -RT/bronchodilator protocol. -Cont current Chest tube care, suction. PTX resolved. CTS following  -Follow chest x-ray -Empiric cefepime as above for suspected pneumonia versus pulmonary contusion  AKI: worse with oligura and acidosis. I/O corded is 22 L positive.  Regardless of accuracy the patient is clearly volume overloaded with anasarca and now oliguria. P Follow renal panel Bicarb infusion Avoid nephrotoxins Pending discussion goals of care with family the patient is heading towards CRRT Consult nephrology as appropriate   Acute encephalopathy following cardiac arrest -Per son, reports at baseline, pt has mild memory issues, but lives independently with wife and completes  ADL's, baseline oriented -CT head without acute findings -pt is reportedly intermittently following commands overnight however he has not this morning.  EEG with diffuse slowing P -Analgesia and sedation.  Acute blood loss anemia, as above  -Hemothorax and RP bleed.  P -monitor H&H  Coagulopathy and thrombocytopenia.  Status post FFP.  He is off antiplatelet and anticoagulation therapy P Check coags and  liver function   Shock.  Most recent decompensation most consistent with acute blood loss anemia, evolving respiratory and metabolic acidosis. Consider component of sepsis given hypothermia, wbc (could be from steroids), persistent hypotension despite blood transfusions.  P -Follow CBC and support with PRBC as necessary to maintain hemoglobin greater than 8.0. -Wean phenylephrine and norepinephrine as able, continue vasopressin -Follow lactic acid -Minimize sedating medications as able -Stress dose steroids -Reculture -Continue empiric cefepime  History of Atrial fibrillation.  Went back into A. fib with RVR overnight.  Amiodarone started.  Better rate control today. P:  -Replace calcium -Continue to follow telemetry -No anticoagulation -Continue amiodarone -Follow mag level.  2.2 today  Hx HTN, HFpEF P:  -Will retry his echocardiogram once subcutaneous air is improved -Metoprolol discontinued  Extensive subcutaneous air and UE skin weeping -bandages to arms, poor peripheral IV access P: -Continue wound care   Patient is now multisystem organ failure on high-dose pressors.  Will consult palliative care to assist with clear goals of care moving forward.  I do not think the patient will survive this. Best practice:  Diet: NPO Pain/Anxiety/Delirium protocol (if indicated): prn fentanyl /versed VAP protocol (if indicated): yes DVT prophylaxis: heparin SQ GI prophylaxis: PPI Glucose control: SSI Mobility: BR Code Status: DNR. Family Communication: Will update. Disposition:  ICU   Labs   CBC: Recent Labs  Lab 08/20/2019 1726  08/03/19 0303  08/04/19 0125  08/04/19 1812 08/05/19 0257  08/05/19 2305 08/19/19 0053 19-Aug-2019 0055 2019-08-19 0402 08-19-2019 0606  WBC 4.8   < > 14.0*  --  11.6*  --  33.3* 30.3*  --   --   --   --  31.8*  --   NEUTROABS 3.8  --   --   --   --   --   --   --   --   --   --   --   --   --   HGB 9.0*   < > 7.9*   < > 5.6*   < > 6.1* 7.0*   < >  7.1* 7.5* 8.1* 8.1* 8.8*  HCT 32.2*   < > 27.4*   < > 19.4*   < > 19.3* 22.5*   < > 21.0* 22.0* 25.5* 25.8* 26.0*  MCV 101.9*   < > 100.0  --  102.6*  --  97.0 96.6  --   --   --   --  97.7  --   PLT 241   < > 195  --  147*  --  PLATELET CLUMPS NOTED ON SMEAR, COUNT APPEARS DECREASED 95*  --   --   --   --  85*  --    < > = values in this interval not displayed.    Basic Metabolic Panel: Recent Labs  Lab 08/01/19 0452  08/02/19 0432  08/02/19 2052 08/03/19 0303 08/03/19 0700  08/04/19 0125 08/05/19 0257  08/05/19 1721 08/05/19 2305 08/19/19 0053 Aug 19, 2019 0402 08/19/19 0606  NA  --    < > 146*   < > 145  --  147*   < >  150* 148*   < > 147* 149* 147* 144 142  K  --    < > 4.2   < > 3.8  --  3.9   < > 5.1 4.6   < > 4.5 3.9 4.1 4.3 4.1  CL  --    < > 102   < > 104  --  107  --  110 114*  --   --   --   --  113*  --   CO2  --    < > 35*   < > 36*  --  34*  --  35* 28  --   --   --   --  25  --   GLUCOSE  --    < > 96   < > 121*  --  121*  --  89 145*  --   --   --   --  134*  --   BUN  --    < > 40*   < > 43*  --  46*  --  51* 59*  --   --   --   --  65*  --   CREATININE  --    < > 0.94   < > 0.79  --  0.70  --  0.91 1.42*  --   --   --   --  1.87*  --   CALCIUM  --    < > 7.5*   < > 7.5*  --  7.7*  --  7.4* 6.7*  --   --   --   --  6.6*  --   MG 2.3  --  2.3  --   --  2.3  --   --  2.5*  --   --   --   --   --  2.2  --   PHOS 4.1  --   --   --   --   --   --   --   --   --   --   --   --   --   --   --    < > = values in this interval not displayed.   GFR: Estimated Creatinine Clearance: 31.7 mL/min (A) (by C-G formula based on SCr of 1.87 mg/dL (H)). Recent Labs  Lab 08/02/19 0432  08/03/19 1122 08/04/19 0125  08/04/19 1228 08/04/19 1525 08/04/19 1812 08/05/19 0257 August 26, 2019 0402  PROCALCITON 0.65  --  0.33 0.24  --   --   --   --  4.72  --   WBC 12.9*   < >  --  11.6*  --   --   --  33.3* 30.3* 31.8*  LATICACIDVEN  --   --  1.0  --    < > 3.2* 4.1* 4.4* 3.4*  --    < > =  values in this interval not displayed.    ABG    Component Value Date/Time   PHART 7.215 (L) 08/26/19 0053   PCO2ART 54.8 (H) 08/26/2019 0053   PO2ART 70.0 (L) 08/26/2019 0053   HCO3 21.0 08-26-19 0606   TCO2 22 08-26-2019 0606   ACIDBASEDEF 5.0 (H) 2019/08/26 0606   O2SAT 42.0 Aug 26, 2019 0606     Coagulation Profile: Recent Labs  Lab 08/01/19 0429 08/04/19 0203 08/05/19 1752  INR 1.4* 1.7* 3.9*     Recent Labs  Lab 08/05/19 1145 08/05/19 1552 08/05/19 2003 08/05/19 2249  08/28/19 0418  GLUCAP 129* 115* 109* 91 115*       The patient is critically ill with respiratory failure, shock, acute renal failure. He requires ICU for high complexity decision making, titration of high alert medications, ventilator management, titration of oxygen and interpretation of advanced monitoring.    I personally spent 45 minutes providing critical care services including personally reviewing test results, discussing care with nursing staff/other physicians and completing orders pertaining to this patient.  Time was exclusive to the patient and does not include time spent teaching or in procedures.  Voice recognition software was used in the production of this record.  Errors in interpretation may have been inadvertently missed during review.  Francine Graven, MSN, AGACNP  Pager [phone] or if no answer [phone] [facility] Pulmonary [facility]

## 2019-08-23 NOTE — Discharge Summary (Signed)
84 year old man with COPD on home O2, atrial fibrillation, HFpEF admitted with asystolic cardiac arrest and 20-minute downtime on 9/8.  He developed extensive subcu emphysema, rib fractures and sternal fracture, failed extubation on 9/11 due to large air leak on right with inability to ventilate.  He remains in shock on Levophed, vasopressin and phenylephrine, found to have large right retroperitoneal hematoma.  Significant Hospital Events   9/8 Admit/cardiac arrest 9/9 Off pressors 9/10 Following commands  Consults:  CT surgery  Procedures:  9/8 ETT >> 9/8 Foley >> 9/8 OGT >> 9/9 L femoral central line>> 9/9 R lateral 4F CT>>9/9 9/9 R anterior 53F CT>>   Significant Diagnostic Tests:  9/8 CTH >> No acute intracranial pathology,  Mild age-related atrophy and chronic microvascular ischemic changes. 9/9 CT chest  >>moderate R hydropneumonothorax, extensive subcutaneous emphysema, possible tear in anterior costochondral junction, patchy RLL opacity  9/9 EEG>>continuous generalized slowing, no epileptiform changes 9/9 bilat LE venous doppler>>no evidence DVT 08-29-23 CXR: R apical bulla noted, CT in place, no obvious pneumothorax, significant right sided interstitial and airspace opacities. ETT in adequate position. Gastric tube in place.   Micro Data:  9/8 COVID >> neg 9/8 BCx 2 >>NGTD 9/8 trach asp >>  Antimicrobials:  9/8 vanc>>9/10 9/8 Cefepime>>    COURSE -   Shock post asystolic cardiac arrest-felt to be hemorrhagic due to right hemothorax and retroperitoneal hematoma but hemoglobin is now stable, could this be cardiogenic, will repeat echo, no evidence of sepsis can discontinue stress dose steroids -Continue empiric cefepime for H CAP. -Started on amiodarone 9/13 for atrial fibrillation  Acute hypoxic/hypercarbic respiratory failure -repeat ABG today, now ventilating better based on tidal volumes so expect PCO2 to have  Feel that due to sternal and rib fractures  would be quite unlikely that he would tolerate extubation and if we are to push forward will likely require tracheostomy  AKI -continue bicarbonate drip, now oliguric  Acute blood loss anemia due to retroperitoneal bleed and hemothorax-hemoglobin improved, will aim for 7 and above, INR was corrected  Goals of care discussion with family and they opted for withdrawal of life support.  He passed away soon after on 29-Aug-2023.  Cause of death -acute coronary syndrome , AKI, acute respiratory failure COPD on home oxygen, atrial fibrillation, chronic diastolic heart failure    V. Elsworth Soho MD

## 2019-08-23 DEATH — deceased

## 2019-10-01 ENCOUNTER — Other Ambulatory Visit: Payer: Self-pay | Admitting: Family Medicine

## 2020-02-04 IMAGING — DX DG ABDOMEN 1V
1 series · 1 of 1 positions shown · non-contrast
Comparison: 08/01/2019 at [DATE] p.m.

CLINICAL DATA: OG tube placement.

EXAM:
ABDOMEN - 1 VIEW [DATE] p.m.

[abdomen kub]
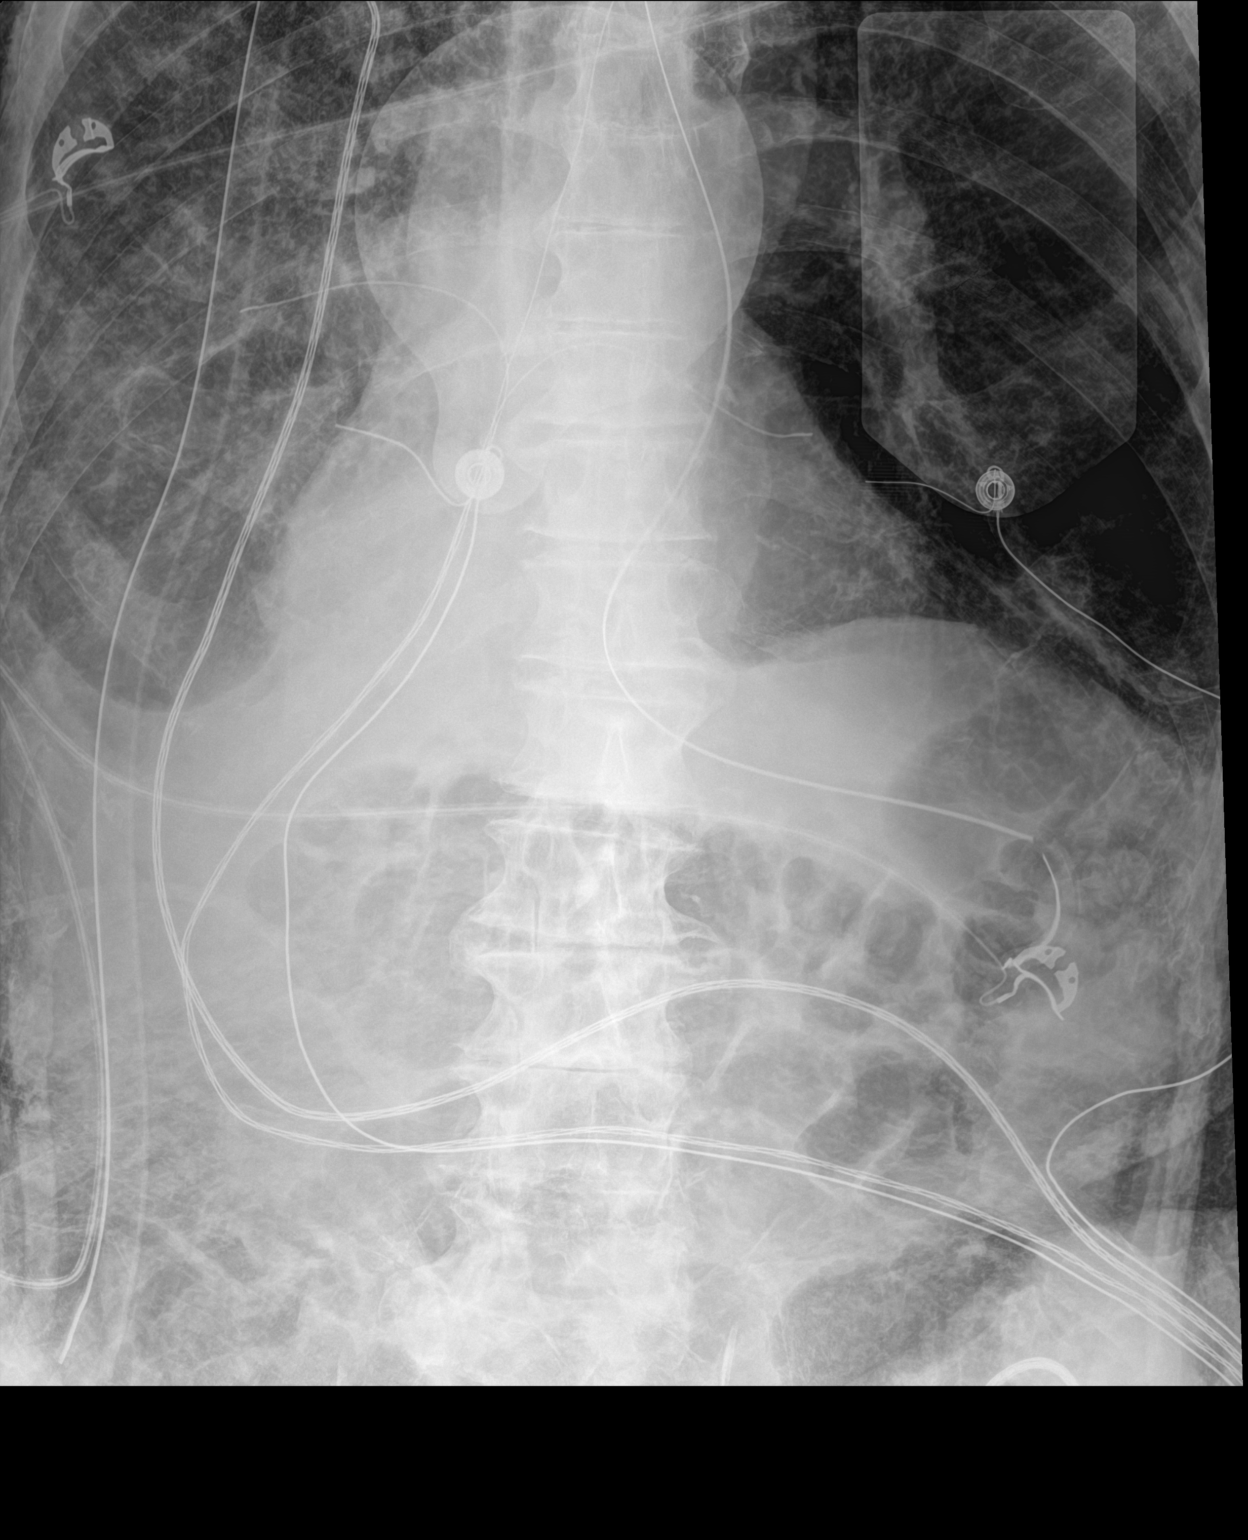

[1 of 1 positions shown; findings below may reference images not displayed]

FINDINGS: The OG tube is been advanced. The tip is now in the fundus of the
stomach.

There is air scattered throughout the bowel, unchanged. Right
pleural effusion.
IMPRESSION: OG tube tip is now in the fundus of the stomach.  No other change.

Left femoral catheter tip is at the S1 level, unchanged.

## 2020-02-04 IMAGING — CT CT CHEST W/O CM
2 of 4 series · 15 of 36 positions shown, 18 images · non-contrast
Comparison: Most recent prior radiograph same day

CLINICAL DATA: Shortness of breath, pneumothorax

EXAM:
CT CHEST WITHOUT CONTRAST
TECHNIQUE: Multidetector CT imaging of the chest was performed following the
standard protocol without IV contrast.

[Series 4: thorax 2.0 · axial · 0.79mm/px · z∈[-760,-420]mm · 12 of 190 slices shown, 15 images]
[im 10/190  mediastinal]
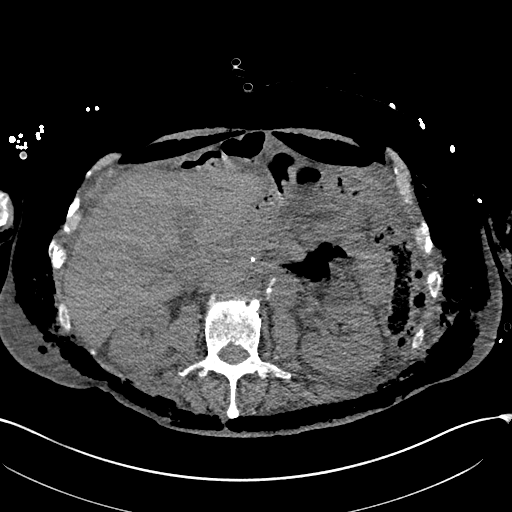
[im 10/190  lung]
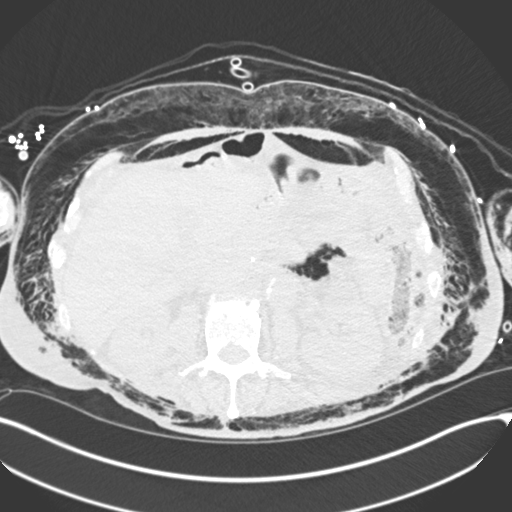
[im 30/190  lung]
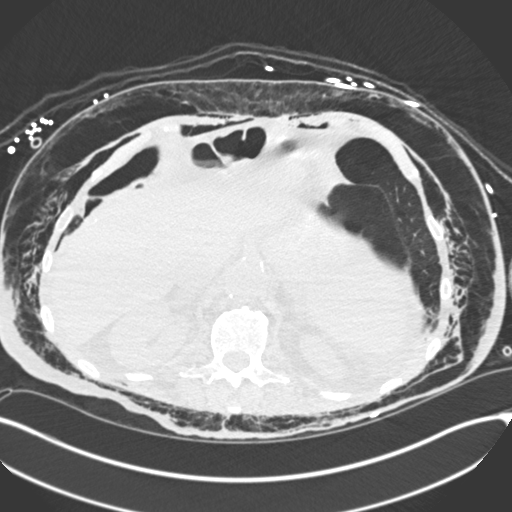
[im 40/190  lung]
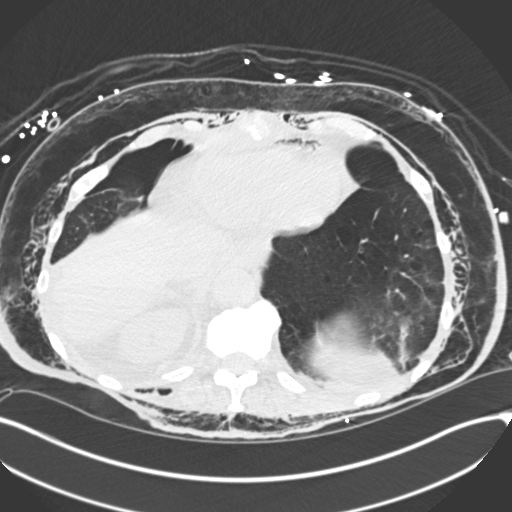
[im 60/190  lung]
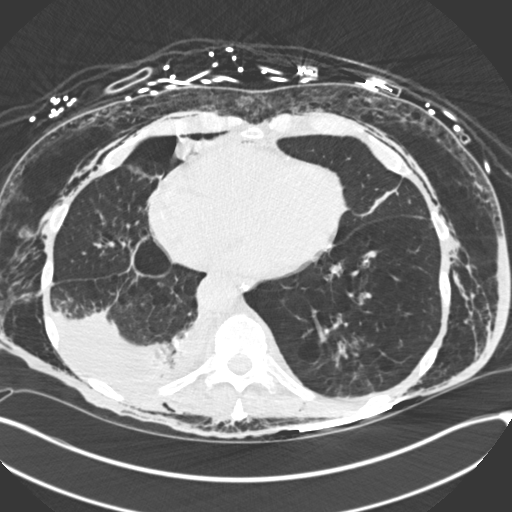
[im 70/190  mediastinal]
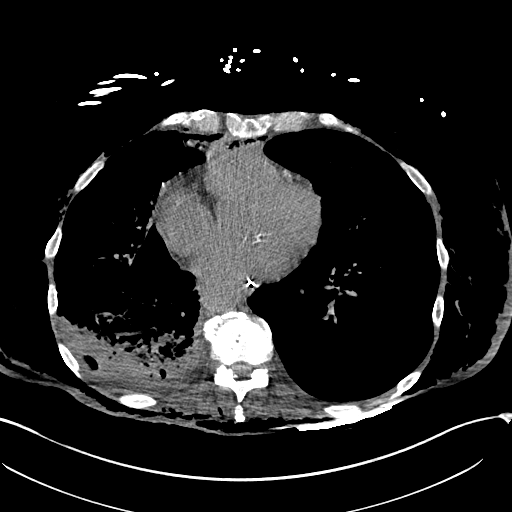
[im 70/190  lung]
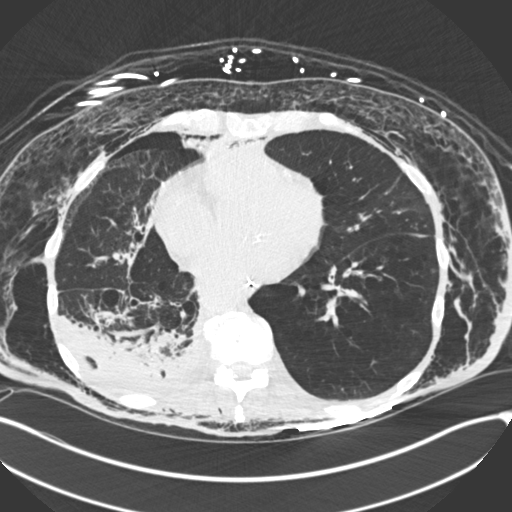
[im 90/190  lung]
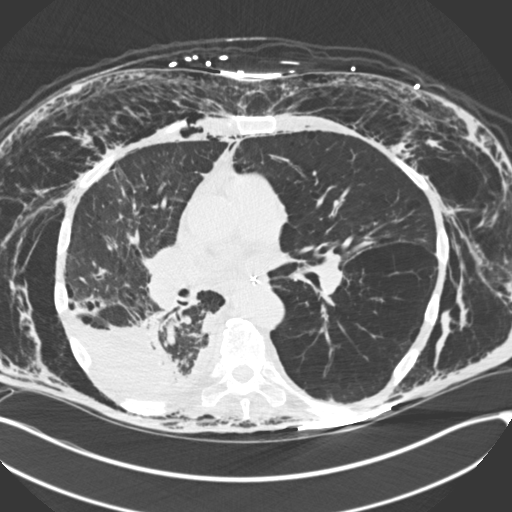
[im 100/190  lung]
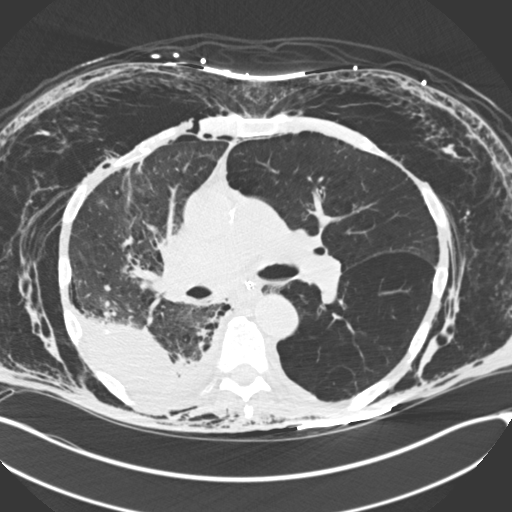
[im 120/190  lung]
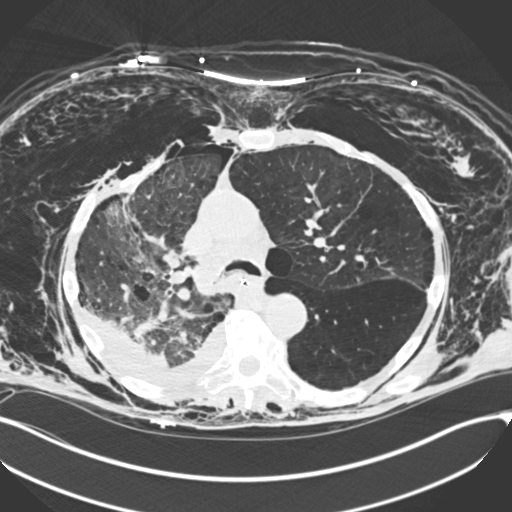
[im 130/190  mediastinal]
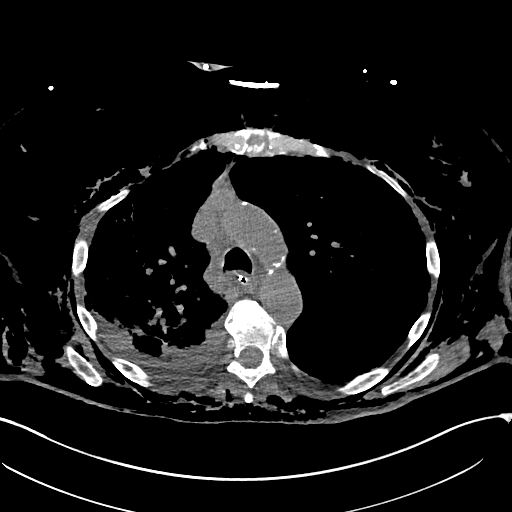
[im 130/190  lung]
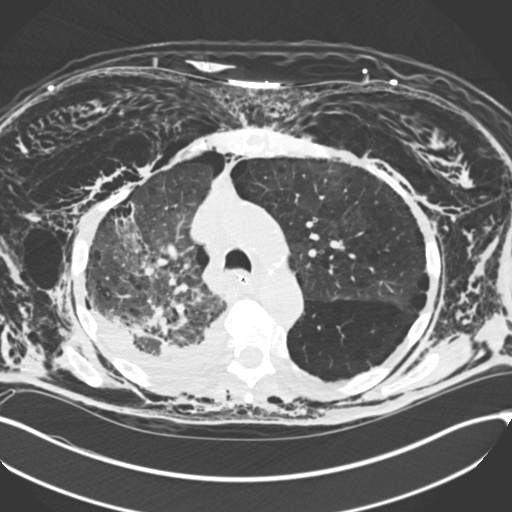
[im 150/190  lung]
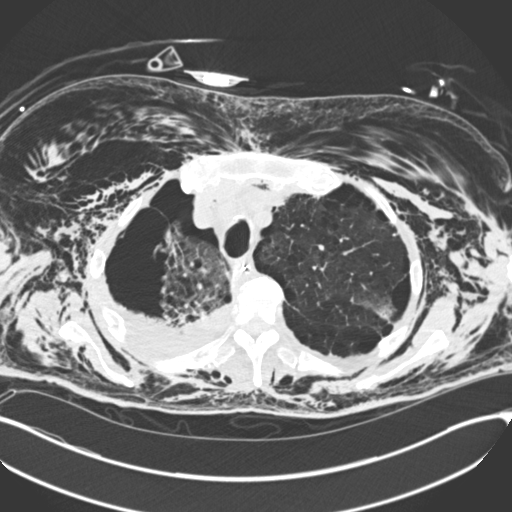
[im 160/190  lung]
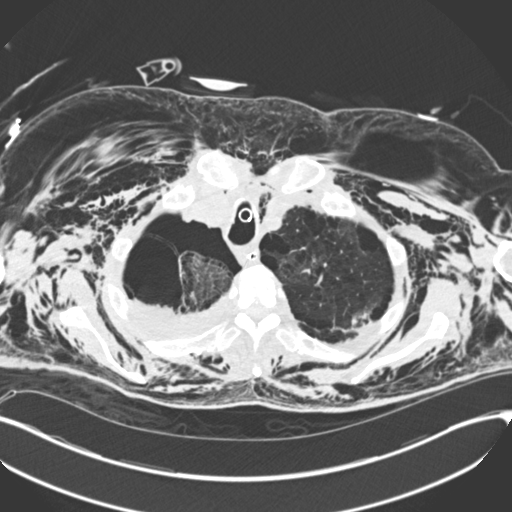
[im 180/190  lung]
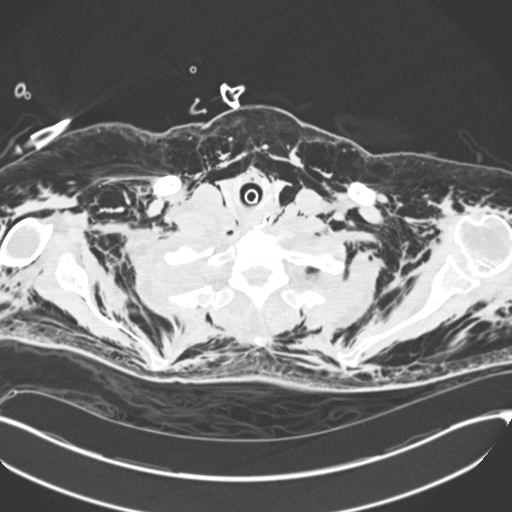

[Series 6: coronal · coronal · 0.73mm/px · 3 of 102 slices shown]
[im 21/102  lung]
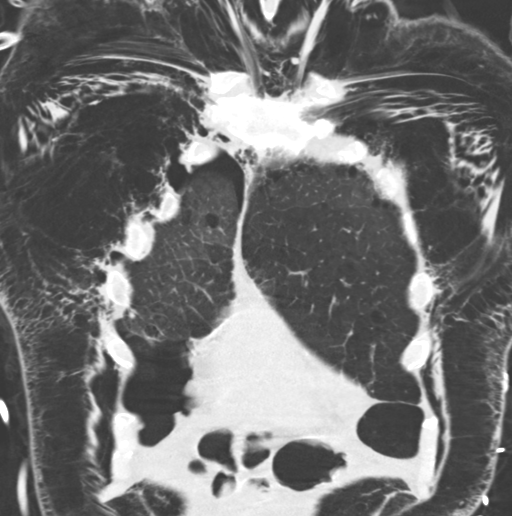
[im 41/102  lung]
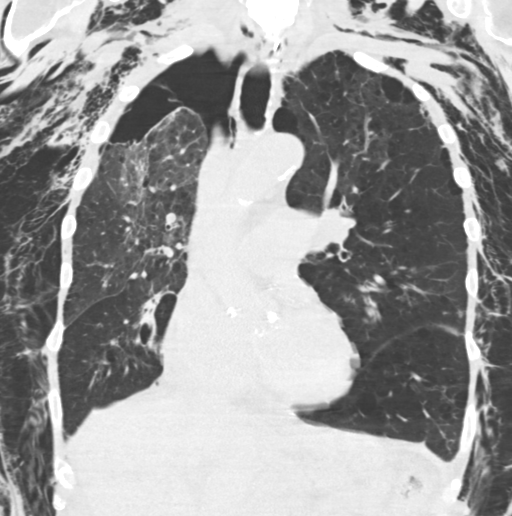
[im 61/102  lung]
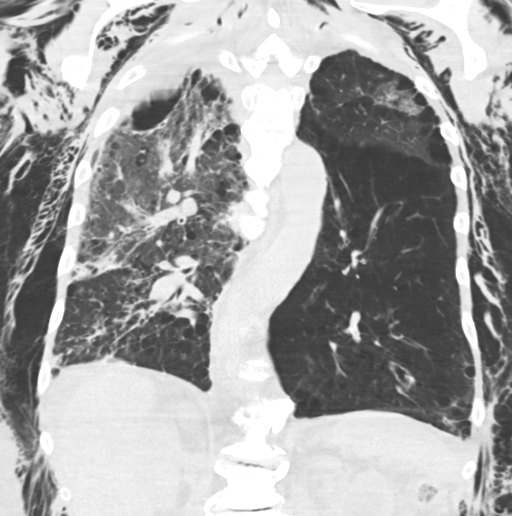

[15 of 36 positions shown; findings below may reference images not displayed]

FINDINGS: Cardiovascular: There is cardiomegaly with a small pericardial
effusion. Scattered aortic vascular calcifications.

Mediastinum/Nodes: There is pneumomediastinum tracking into the
upper anterior mediastinal space. Small amount of fluid seen within
the trachea. ET tube is seen 5 cm above the level of the carina.

Lungs/Pleura: There is a moderate right hydropneumothorax with
slight rightward mediastinal shift. Along the anterior right chest
wall there appears to be a 4.4 cm gap in the chest wall at the
costochondral junction with adjacent fractures of the anterior right
second through fourth costochondral junctions. At this level of the
defect there is a thin pleural covering, with a probable tear within
the pleura at this level. There is reticulonodular thickening and
consolidation in the posterior right lower lung. There is extensive
centrilobular emphysematous changes with large right-sided apical
bullae. Also noted is extensive centrilobular emphysematous changes
throughout the left lung. There is small amount patchy airspace
opacity seen at the left lung.

Upper abdomen: The NG tube is seen within the distal esophagus
approximately 5 cm above the level of the diaphragm.

Musculoskeletal/Chest wall: Extensive subcutaneous emphysema is seen
throughout the chest extending into the neck and upper mediastinum.
There are fractures along the posterior first through fourth ribs.
There is also fractures along the anterior second through fourth
ribs involving the costochondral junctions.
IMPRESSION: 1. Moderate right hydropneumothorax with slight rightward
mediastinal shift.
2. Extensive subcutaneous emphysema tracking into the neck and a
small amount pneumomediastinum
3. Along the anterior chest there is a 4.4 cm defect in the chest
wall at the costochondral junction with a probable tear in the
adjacent pleura.
4. Fractures and deformity of the anterior chest wall involving the
second through fourth ribs at the costochondral junctions.
5. Posterior first through fourth rib fractures.
6. Bilateral extensive centrilobular emphysematous changes.
7. Patchy airspace opacities seen at the posterior right lower lung
could be aspiration or pneumonia.
8. NG tube approximately 5 cm above the level the diaphragm in the
distal esophagus.
9. These results were called by telephone at the time of
interpretation on 08/01/2019 at [DATE] to provider DR [employer] Hidari,
who verbally acknowledged these results.

## 2020-04-09 ENCOUNTER — Ambulatory Visit: Payer: Medicare HMO
